# Patient Record
Sex: Female | Born: 1935
Health system: Southern US, Community
[De-identification: ages and names within clinical notes are randomized; demographics above are authoritative.]

## PROBLEM LIST (undated history)

## (undated) DIAGNOSIS — I428 Other cardiomyopathies: Secondary | ICD-10-CM

## (undated) DIAGNOSIS — I10 Essential (primary) hypertension: Secondary | ICD-10-CM

## (undated) DIAGNOSIS — R413 Other amnesia: Secondary | ICD-10-CM

## (undated) DIAGNOSIS — D649 Anemia, unspecified: Secondary | ICD-10-CM

## (undated) DIAGNOSIS — K219 Gastro-esophageal reflux disease without esophagitis: Secondary | ICD-10-CM

## (undated) DIAGNOSIS — E7800 Pure hypercholesterolemia, unspecified: Secondary | ICD-10-CM

## (undated) DIAGNOSIS — I447 Left bundle-branch block, unspecified: Secondary | ICD-10-CM

## (undated) DIAGNOSIS — E119 Type 2 diabetes mellitus without complications: Secondary | ICD-10-CM

## (undated) DIAGNOSIS — M519 Unspecified thoracic, thoracolumbar and lumbosacral intervertebral disc disorder: Secondary | ICD-10-CM

## (undated) HISTORY — DX: Essential (primary) hypertension: I10

## (undated) HISTORY — PX: DILATION AND CURETTAGE OF UTERUS: SHX78

## (undated) HISTORY — PX: FRACTURE SURGERY: SHX138

## (undated) HISTORY — PX: TUBAL LIGATION: SHX77

## (undated) HISTORY — DX: Left bundle-branch block, unspecified: I44.7

## (undated) HISTORY — DX: Gastro-esophageal reflux disease without esophagitis: K21.9

## (undated) HISTORY — PX: CATARACT EXTRACTION W/ INTRAOCULAR LENS IMPLANT: SHX1309

## (undated) HISTORY — PX: EYE SURGERY: SHX253

## (undated) HISTORY — DX: Other cardiomyopathies: I42.8

---

## 1969-07-18 HISTORY — PX: APPENDECTOMY: SHX54

## 1979-07-19 HISTORY — PX: VAGINAL HYSTERECTOMY: SUR661

## 1979-07-19 HISTORY — PX: ORIF ANKLE FRACTURE: SUR919

## 2003-11-03 ENCOUNTER — Ambulatory Visit (HOSPITAL_COMMUNITY): Admission: RE | Admit: 2003-11-03 | Discharge: 2003-11-04 | Payer: Self-pay | Admitting: Cardiology

## 2003-11-07 ENCOUNTER — Encounter: Payer: Self-pay | Admitting: Internal Medicine

## 2003-11-07 ENCOUNTER — Ambulatory Visit (HOSPITAL_COMMUNITY): Admission: RE | Admit: 2003-11-07 | Discharge: 2003-11-07 | Payer: Self-pay | Admitting: Internal Medicine

## 2006-05-21 ENCOUNTER — Ambulatory Visit: Payer: Self-pay | Admitting: Cardiology

## 2006-08-04 ENCOUNTER — Encounter: Payer: Self-pay | Admitting: Cardiology

## 2006-08-04 ENCOUNTER — Ambulatory Visit: Payer: Self-pay

## 2008-09-13 ENCOUNTER — Ambulatory Visit: Payer: Self-pay | Admitting: Cardiology

## 2008-10-04 ENCOUNTER — Encounter: Payer: Self-pay | Admitting: Cardiology

## 2008-10-04 ENCOUNTER — Ambulatory Visit: Payer: Self-pay

## 2009-06-27 ENCOUNTER — Encounter (INDEPENDENT_AMBULATORY_CARE_PROVIDER_SITE_OTHER): Payer: Self-pay | Admitting: *Deleted

## 2009-12-13 DIAGNOSIS — I428 Other cardiomyopathies: Secondary | ICD-10-CM | POA: Insufficient documentation

## 2009-12-13 DIAGNOSIS — K219 Gastro-esophageal reflux disease without esophagitis: Secondary | ICD-10-CM | POA: Insufficient documentation

## 2009-12-13 DIAGNOSIS — I10 Essential (primary) hypertension: Secondary | ICD-10-CM | POA: Insufficient documentation

## 2009-12-13 DIAGNOSIS — E119 Type 2 diabetes mellitus without complications: Secondary | ICD-10-CM | POA: Insufficient documentation

## 2009-12-13 DIAGNOSIS — E118 Type 2 diabetes mellitus with unspecified complications: Secondary | ICD-10-CM | POA: Insufficient documentation

## 2009-12-13 DIAGNOSIS — I152 Hypertension secondary to endocrine disorders: Secondary | ICD-10-CM | POA: Insufficient documentation

## 2009-12-13 DIAGNOSIS — E1165 Type 2 diabetes mellitus with hyperglycemia: Secondary | ICD-10-CM | POA: Insufficient documentation

## 2009-12-13 DIAGNOSIS — I080 Rheumatic disorders of both mitral and aortic valves: Secondary | ICD-10-CM | POA: Insufficient documentation

## 2009-12-14 ENCOUNTER — Ambulatory Visit: Payer: Self-pay | Admitting: Cardiology

## 2009-12-17 ENCOUNTER — Telehealth: Payer: Self-pay | Admitting: Cardiology

## 2010-04-04 ENCOUNTER — Ambulatory Visit (HOSPITAL_COMMUNITY): Admission: RE | Admit: 2010-04-04 | Discharge: 2010-04-04 | Payer: Self-pay | Admitting: Ophthalmology

## 2010-12-15 LAB — CONVERTED CEMR LAB
BUN: 13 mg/dL (ref 6–23)
CO2: 32 meq/L (ref 19–32)
Calcium: 9.5 mg/dL (ref 8.4–10.5)
Chloride: 107 meq/L (ref 96–112)
Creatinine, Ser: 0.7 mg/dL (ref 0.4–1.2)
GFR calc non Af Amer: 105.21 mL/min (ref >60)
Glucose, Bld: 127 mg/dL — ABNORMAL HIGH (ref 70–99)
Potassium: 4.7 meq/L (ref 3.5–5.1)
Sodium: 142 meq/L (ref 135–145)

## 2010-12-17 NOTE — Assessment & Plan Note (Signed)
Summary: rov/ gd  Medications Added LISINOPRIL-HYDROCHLOROTHIAZIDE 20-12.5 MG TABS (LISINOPRIL-HYDROCHLOROTHIAZIDE) Take 1 tablet by mouth once a day AMARYL 4 MG TABS (GLIMEPIRIDE) 1 tab po two times a day * CALCIUM monthly        History of Present Illness: Kelly Castillo is a  female who I have seen previously for a history of nonischemic cardiomyopathy.  A previous catheterization performed in 2004, showed nonobstructive coronary disease and significant mitral regurgitation and graded 3+ or more.  Her ejection fraction was 41% at the time. A TEE at that time showed mitral regurgitation that was felt to be 2-3+.  However, her most recent echocardiogram was performed in 10/09. This showed an ejection fraction of 50-55%, trace aortic insufficiency, and mild mitral and tricuspid regurgitation. Since I last saw her in October of 2009 she denies any dyspnea on exertion, orthopnea, PND, pedal edema, palpitations or syncope.   Preventive Screening-Counseling & Management  Alcohol-Tobacco     Smoking Status: never  Current Medications (verified): 1)  Lisinopril-Hydrochlorothiazide 20-12.5 Mg Tabs (Lisinopril-Hydrochlorothiazide) .... Take 1 Tablet By Mouth Once A Day 2)  Amaryl 4 Mg Tabs (Glimepiride) .Marland Kitchen.. 1 Tab Po Two Times A Day 3)  Calcium .... Monthly  Past History:  Past Medical History: history of nonischemic cardio myopathy improved by most recent echocardiogram Diabetes mellitus Gastroesophageal reflux disease left bundle-branch block hypertension  Past Surgical History: Reviewed history from 12/13/2009 and no changes required. Status post hysterectomy, status post appendectomy.  Social History: Reviewed history and no changes required. Tobacco Use - No.  Alcohol Use - no Smoking Status:  never  Review of Systems       Occasional back pain but no fevers or chills, productive cough, hemoptysis, dysphasia, odynophagia, melena, hematochezia, dysuria, hematuria, rash, seizure  activity, orthopnea, PND, pedal edema, claudication. Remaining systems are negative.   Vital Signs:  Patient profile:   77 year old female Height:      57 inches Weight:      104 pounds BMI:     22.59 Pulse rate:   73 / minute Resp:     12 per minute BP sitting:   150 / 80  (left arm)  Vitals Entered By: Kem Parkinson (December 14, 2009 2:02 PM)  Physical Exam  General:  Well-developed well-nourished in no acute distress.  Skin is warm and dry.  HEENT is normal.  Neck is supple. No thyromegaly.  Chest is clear to auscultation with normal expansion.  Cardiovascular exam is regular rate and rhythm.  Abdominal exam nontender or distended. No masses palpated. Extremities show no edema. neuro grossly intact    EKG  Procedure date:  12/14/2009  Findings:      Sinus rhythm, left bundle branch block.  Impression & Recommendations:  Problem # 1:  CARDIOMYOPATHY (ICD-425.4) This has improved on most recent echocardiogram. Her updated medication list for this problem includes:    Lisinopril-hydrochlorothiazide 20-12.5 Mg Tabs (Lisinopril-hydrochlorothiazide) .Marland Kitchen... Take 1 tablet by mouth once a day  Problem # 2:  HYPERTENSION (ICD-401.9) Blood pressure mildly elevated. However she states it is typically in the normal range. She will follow this and we will increase medications as needed. Check bmet. Her updated medication list for this problem includes:    Lisinopril-hydrochlorothiazide 20-12.5 Mg Tabs (Lisinopril-hydrochlorothiazide) .Marland Kitchen... Take 1 tablet by mouth once a day  Orders: TLB-BMP (Basic Metabolic Panel-BMET) (80048-METABOL)  Problem # 3:  MITRAL REGURGITATION (ICD-396.3) Mild on most recent echocardiogram.  Problem # 4:  DM (ICD-250.00)  Her updated  medication list for this problem includes:    Lisinopril-hydrochlorothiazide 20-12.5 Mg Tabs (Lisinopril-hydrochlorothiazide) .Marland Kitchen... Take 1 tablet by mouth once a day    Amaryl 4 Mg Tabs (Glimepiride) .Marland Kitchen... 1 tab  po two times a day  Problem # 5:  GERD (ICD-530.81)  Patient Instructions: 1)  Your physician recommends that you schedule a follow-up appointment in: 12 months with Dr Jens Som 2)  Your physician recommends that you return for lab work today

## 2010-12-17 NOTE — Letter (Signed)
Summary: Appointment - Reminder 2  [facility], Main Office  [address]   [place], [place] [address]   Phone: [phone]  Fax: [phone]     June 27, 2009 MRN: [id number]   Kelly Castillo [address] [place], [place]  [address]   Dear Kelly Castillo,  Our records indicate that it is time to schedule a follow-up appointment.  Dr.Crenshaw recommended that you follow up with [place] in Oct 2010. It is very important that we reach you to schedule this appointment. We look forward to participating in your health care needs. Please contact us at the number listed above at your earliest convenience to schedule your appointment.  If you are unable to make an appointment at this time, give [place] a call so we can update our records.     Sincerely,  Lorne Skeens [facility] Scheduling Team

## 2010-12-17 NOTE — Progress Notes (Signed)
Summary: call back   Phone Note Call from Patient Call back at Home Phone [phone]   Caller: Patient Reason for Call: Talk to Nurse Action Taken: Phone Call Completed Details for Reason: returning call from friday Initial call taken by: Lorne Skeens,  December 17, 2009 8:38 AM

## 2010-12-31 ENCOUNTER — Ambulatory Visit: Payer: Self-pay | Admitting: Cardiology

## 2011-01-28 ENCOUNTER — Encounter: Payer: Self-pay | Admitting: Cardiology

## 2011-02-03 LAB — BASIC METABOLIC PANEL
BUN: 20 mg/dL (ref 6–23)
CO2: 30 mEq/L (ref 19–32)
Calcium: 9.9 mg/dL (ref 8.4–10.5)
Chloride: 102 mEq/L (ref 96–112)
Creatinine, Ser: 0.97 mg/dL (ref 0.4–1.2)
GFR calc Af Amer: >60 mL/min (ref >60)
GFR calc non Af Amer: 56 mL/min — ABNORMAL LOW (ref >60)
Glucose, Bld: 180 mg/dL — ABNORMAL HIGH (ref 70–99)
Potassium: 4.6 mEq/L (ref 3.5–5.1)
Sodium: 138 mEq/L (ref 135–145)

## 2011-02-03 LAB — HEMOGLOBIN AND HEMATOCRIT, BLOOD
HCT: 29.2 % — ABNORMAL LOW (ref 36.0–46.0)
Hemoglobin: 10.2 g/dL — ABNORMAL LOW (ref 12.0–15.0)

## 2011-02-03 LAB — GLUCOSE, CAPILLARY: Glucose-Capillary: 118 mg/dL — ABNORMAL HIGH (ref 70–99)

## 2011-02-11 ENCOUNTER — Encounter: Payer: Self-pay | Admitting: Cardiology

## 2011-02-11 ENCOUNTER — Ambulatory Visit (INDEPENDENT_AMBULATORY_CARE_PROVIDER_SITE_OTHER): Payer: Medicare Other | Admitting: Cardiology

## 2011-02-11 VITALS — BP 157/67 | HR 72 | Resp 12

## 2011-02-11 DIAGNOSIS — I428 Other cardiomyopathies: Secondary | ICD-10-CM

## 2011-02-11 MED ORDER — HYDROCHLOROTHIAZIDE 12.5 MG PO CAPS: 12.5000 mg | ORAL_CAPSULE | Freq: Every day | ORAL | Status: DC

## 2011-02-11 MED ORDER — LISINOPRIL 40 MG PO TABS: 40.0000 mg | ORAL_TABLET | Freq: Every day | ORAL | Status: DC

## 2011-02-11 NOTE — Progress Notes (Signed)
HPI:Kelly Castillo is a  female who I have seen previously for a history of nonischemic cardiomyopathy.  A previous catheterization performed in 2004, showed nonobstructive coronary disease and significant mitral regurgitation and graded 3+ or more.  Her ejection fraction was 41% at the time. A TEE at that time showed mitral regurgitation that was felt to be 2-3+.  However, her most recent echocardiogram was performed in 10/09. This showed an ejection fraction of 50-55%, trace aortic insufficiency, and mild mitral and tricuspid regurgitation. Since I last saw her in Jan 2011 she denies any dyspnea on exertion, orthopnea, PND, pedal edema, palpitations or syncope.  Current Outpatient Prescriptions  Medication Sig Dispense Refill  . glimepiride (AMARYL) 4 MG tablet Take 4 mg by mouth 2 (two) times daily.        . iron polysaccharides (FERREX 150) 150 MG capsule Take 150 mg by mouth daily.        Marland Kitchen lisinopril-hydrochlorothiazide (PRINZIDE,ZESTORETIC) 10-12.5 MG per tablet Take 1 tablet by mouth daily.        Marland Kitchen DISCONTD: ibandronate (BONIVA) 150 MG tablet Take 150 mg by mouth every 30 (thirty) days. Take in the morning with a full glass of water, on an empty stomach, and do not take anything else by mouth or lie down for the next 30 min.       Marland Kitchen DISCONTD: lisinopril-hydrochlorothiazide (PRINZIDE,ZESTORETIC) 20-12.5 MG per tablet Take 1 tablet by mouth daily.           Past Medical History  Diagnosis Date  . Non-ischemic cardiomyopathy   . Diabetes mellitus   . GERD (gastroesophageal reflux disease)   . LBBB (left bundle branch block)   . HTN (hypertension)     Past Surgical History  Procedure Date  . Abdominal hysterectomy   . Appendectomy     History   Social History  . Marital Status: Divorced    Spouse Name: N/A    Number of Children: N/A  . Years of Education: N/A   Occupational History  . Not on file.   Social History Main Topics  . Smoking status: Never Smoker   . Smokeless  tobacco: Not on file  . Alcohol Use: No  . Drug Use: Not on file  . Sexually Active: Not on file   Other Topics Concern  . Not on file   Social History Narrative  . No narrative on file    ROS: Recent fatigue from anemia being evaluated by her primary care but no fevers or chills, productive cough, hemoptysis, dysphasia, odynophagia, melena, hematochezia, dysuria, hematuria, rash, seizure activity, orthopnea, PND, pedal edema, claudication. Remaining systems are negative.  Physical Exam: Well-developed well-nourished in no acute distress.  Skin is warm and dry.  HEENT is normal.  Neck is supple. No thyromegaly.  Chest is clear to auscultation with normal expansion.  Cardiovascular exam is regular rate and rhythm.  Abdominal exam nontender or distended. No masses palpated. Extremities show no edema. neuro grossly intact  ECG  Normal sinus rhythmat a rate of 63. Left bundle branch block.

## 2011-02-11 NOTE — Assessment & Plan Note (Signed)
Blood pressure elevated. Increase lisinopril to 40 mg p.o. Daily and continue HCTZ. Check potassium and renal function in one week.

## 2011-02-11 NOTE — Patient Instructions (Signed)
STOP LISINOPRIL HCT START LISINOPRIL 83M,G ONCE DAILY START HCTZ 12.5MG  ONCE DAILY

## 2011-02-11 NOTE — Assessment & Plan Note (Signed)
Management per primary care. 

## 2011-02-11 NOTE — Assessment & Plan Note (Signed)
Echocardiogram most recently revealed normalization of LV function. Continue ACE inhibitor.

## 2011-02-11 NOTE — Assessment & Plan Note (Signed)
Mild on most recent echocardiogram. 

## 2011-02-12 NOTE — Progress Notes (Signed)
Addended by: Kem Parkinson on: 02/12/2011 11:49 AM   Modules accepted: Orders

## 2011-04-01 NOTE — Assessment & Plan Note (Signed)
[facility]                            CARDIOLOGY OFFICE NOTE   AZALIAH, CARRERO                    MRN:          [id number]  DATE:09/13/2008                            DOB:          1934/04/10    Mrs. Rone is a 77 year old female who I have not seen since July 2007.  She has a history of nonischemic cardiomyopathy.  A previous  catheterization performed in 2004, showed nonobstructive coronary  disease and significant mitral regurgitation.  A TEE at that time showed  mitral regurgitation that was felt to be 2-3+.  However, her most recent  echocardiogram was performed in 2007.  This showed normal LV function  with an ejection fraction of 65%.  There is trivial aortic  insufficiency.  There is mild mitral regurgitation.  There is mild left  atrial enlargement.  Again, I have not seen her since 2007.  She does  state she has dyspnea with more extreme activities, but not with routine  activities.  There is no orthopnea, PND or pedal edema.  She has not had  palpitations, exertional chest pain, or syncope.  She is complaining of  some pain in her feet when she stands; however, she does not have frank  claudication.   CURRENT MEDICATIONS:  Her medications include Amaryl 4 mg p.o. b.i.d.,  vitamins and folate.  She also states she takes a blood pressure pill.   PHYSICAL EXAMINATION:  VITAL SIGNS:  Exam today shows a blood pressure  of 152/66 and pulse is 70.  She weighs 112 pounds.  HEENT:  Normal.  NECK:  Supple with no bruits.  CHEST:  Clear.  CARDIOVASCULAR:  Regular rhythm.  There is a 2/6 systolic murmur at the  apex.  ABDOMEN:  Shows no tenderness.  EXTREMITIES:  Show no edema.  She has 2+ dorsalis pedis pulses  bilaterally.   DIAGNOSTIC DATA:  Her electrocardiogram shows sinus rhythm at a rate of  65.  There is a left bundle-branch block.   DIAGNOSES:  1. History of nonischemic cardiomyopathy - her most recent      echocardiogram  showed that this improved and normal.  We will plan      to repeat this.  She does not know her medications and I have asked      her to contact us with those names.  We will adjust these based on      which she is taking at present.  2. History of noncompliance.  3. Mitral regurgitation - we will plan to repeat her echocardiogram.  4. Hypertension - her blood pressure is mildly elevated.  Again I have      asked her to contact us with her medications.  We will increase as      needed.  5. Diabetes mellitus.  6. Gastroesophageal reflux disease.  7. History of foot pain - she has 2+ DP pulses and this was not found      to be claudication.  It may be a peripheral neuropathy.  8. History of diabetes - note we had placed her on statin in the past,  but she discontinued this.  I will leave this to her primary care      physician.  We will see her back in 12 months.     Madolyn Frieze Jens Som, MD, [facility]  Electronically Signed    BSC/MedQ  DD: 09/13/2008  DT: 09/13/2008  Job #: [id number]   cc:   Tammy R. Collins [place], M.D.

## 2011-04-04 NOTE — Discharge Summary (Signed)
Kelly Castillo, Kelly Castillo                         ACCOUNT NO.:  [id number]   MEDICAL RECORD NO.:  [id number]                   PATIENT TYPE:  OIB   LOCATION:  6703                                 FACILITY:  [facility]   PHYSICIAN:  Arturo Morton. Riley Kill, M.D.             DATE OF BIRTH:  12/27/1933   DATE OF ADMISSION:  11/03/2003  DATE OF DISCHARGE:  11/04/2003                                 DISCHARGE SUMMARY   DISCHARGE DIAGNOSES:  1. Non-critical coronary artery disease.  2. Significant mitral regurgitation.  3. Pulmonary hypertension.  4. Diabetes mellitus, oral agents.  5. Gastroesophageal reflux disease.  6. Status post hysterectomy, status post appendectomy.   HOSPITAL COURSE:  [name] is a 77 year old female patient who presented  to Dr. Jens Som on November 02, 2003, for evaluation of chest pain and an  abnormal EKG.  She describes chest pain that occurs with moderate exertion  and improves with decreasing her activities.  There is no nausea, vomiting,  or diaphoresis, just shortness of breath.  The pain resolves with rest.  She  was noted on exam to have a 2/6 systolic murmur at the apex that radiates to  the left axilla with a squeaking type component.  At that point, she was  felt to have classic anginal symptoms and was sent for cardiac  catheterization.  She was admitted on November 03, 2003, for a cardiac  catheterization.  She was found to have non-obstructive coronary artery  disease.  The LV gram revealed mild global dyskinesis and an EF of 41%.  LV  gram revealed a significantly enlarged left atrium and 3+ or more mitral  regurgitation.  The patient's medications were adjusted and she remained  stable overnight and was ready for discharge home the following day.  Temperature was 98.3, pulse 70, respirations 18, blood pressure 121/51, 99%  on room air.   LABORATORY DATA:  Hemoglobin 11.6, hematocrit 34.6, platelets 216, BUN 10,  creatinine 0.7.  Her right groin was clear  with no evidence of ecchymoses,  tenderness, or bruit.   She has a TEE scheduled for Tuesday, and this will be performed at noon.  She is not to drink anything after midnight on November 06, 2003, for this  TEE on November 07, 2003.  She may resume her Amaryl, but she is to hold  this on Tuesday, November 07, 2003.   NEW MEDICATIONS:  1. Altace 2.5 mg b.i.d.  2. HCTZ 12.5 mg a day.  3. She is to resume her Zocor 40 mg a day, and Toprol XL 25 mg a day, as     well as her baby aspirin.   She needs a BMET that Tuesday prior to her TEE, and this needs to be called  to our service before the TEE is completed.  She may utilize Tylenol.   ACTIVITY:  No strenuous activity until this is performed.   DIET:  Remain on  a low fat diet.   Call with any questions or concerns with her cath site.   FOLLOWUP:  Her formal followup with Dr. Jens Som is on November 13, 2003, at  10:45 a.m.  Apparently, she also has another follow up appointment on  November 29, 2003, at 9:15 a.m.      Guy Franco, P.A. [facility]                      Thomas D. Riley Kill, M.D.    LB/MEDQ  D:  11/04/2003  T:  11/05/2003  Job:  [id number]

## 2011-04-04 NOTE — Cardiovascular Report (Signed)
Kelly Castillo, Kelly Castillo                         ACCOUNT NO.:  [id number]   MEDICAL RECORD NO.:  [id number]                   PATIENT TYPE:  OIB   LOCATION:  6703                                 FACILITY:  [facility]   PHYSICIAN:  Arturo Morton. Riley Kill, M.D.             DATE OF BIRTH:  September 01, 1934   DATE OF PROCEDURE:  DATE OF DISCHARGE:  11/04/2003                              CARDIAC CATHETERIZATION   PROCEDURES PERFORMED:  1. Left and right heart catheterization.  2. Selective coronary arteriography.  3. Selective left ventriculography.   CARDIOLOGIST:  Arturo Morton. Riley Kill, M.D.   INDICATIONS FOR PROCEDURE:  Ms. Pluta is a delightful 77 year old female  with a history of diabetes and with a history of classical exertional  angina.  She was seen by Dr. Jens Som and subsequently referred for cardiac  catheterization.   DESCRIPTION OF THE PROCEDURE:  The patient was brought ot the cath lab and  prepped and draped in the usual fashion.  Through an anterior puncture the  right femoral artery was easily entered.  Ventriculography was performed in  the RAO projection.  This demonstrated reduced overall LV function and  evidence of mitral regurgitation of a substantial degree.  Based on this we  had planned to do right heart catheterization.   Following a pressure pullback the pigtail catheter was removed.  Coronary  arteriography was then performed without complications using standard  Judkins catheters.  Attention was then turned to right heart  catheterization.   An 8 French sheath was placed in the right femoral vein.  A 7,5 French  thermodilution Swan-Ganz catheter had been tested by the technologist and  was placed in the inferior vena cava.  With inflation of the balloon there  was fracture of the balloon with some air noted.  This was a fairly small  amount.  We used a right coronary guide catheter and I removed the majority  of this and the remainder was dissolved.  A second catheter  was then  retested and found to be satisfactory, and taken up into the pulmonary  artery where pressures were measured.  Thermodilution cardiac outputs were  performed.  Saturations were obtained from the superior vena cava and the  pulmonary artery as well as an arterial saturation from the right heart.  Simultaneous wedge LV pressures were recorded.  The right heart catheter was  subsequently removed.   All catheters were subsequently removed and the patient was taken to the  holding are in satisfactory clinical condition.  She experienced no chest  pain or significant shortness of breath.  She as briefly given O2.   HEMODYNAMIC DATA:  1. Right atrial pressure 10.  2. RV 46/9.  3. Pulmonary artery 45/23.  4. Pulmonary capillary wedge 20.  5. Central aorta 159/76, 158/17.  6. No aortic to left ventricular gradient on pullback.  7. No significant mitral valve gradient.  8. Aortic saturation 90%.  9. Superior  vena cava saturation 63%.  10.      PA saturation 62%.  11.      Thermodilution cardiac output 32.9 liters/minute.  12.      Thermodilution cardiac index 2.6 liters/minute/meter squared.  13.      Fick cardiac output was 3.2 liters/minute.  14.      Fick cardiac index 2.1 liters/minute/meter squared.   ANGIOGRAPHIC DATA:  1. Selective Left Ventriculography:  Ventriculography in the RAO projection     reveals mild-to-moderate global hypokinesis.  There appears to be at     least + mitral regurgitation in the left atrium; it would appear to be     really quite large.  The overall left ventricular systolic function is     reduced with an ejection fraction of 41%.   1. Left Main Coronary Artery:  The left main coronary artery is free of     critical disease.   1. Left Anterior Descending:  The LAD courses to the apex.  There appears to     be one major diagonal branch.  The LAD at the apex bifurcates.  The LAD     really is quite smooth given the patient's diabetes and the   LAD diagonal     system is without critical disease.   1. Circumflex Artery:  The circumflex system provides a proximal marginal     branch, which has two major bifurcations.  There is a second mg; branch,     which is relatively small; and a fairly small AV circumflex, which     provides several tiny posterolateral branches.  There is a moderate-sized     atrial circumflex branch noted.   1. Right Coronary Artery:  The right coronary artery is a dominant vessel.     The RCA provides four major inferior branches.  All of these are moderate     in size, appear to be fairly smooth throughout their course even distally     and without critical narrowing.   CONCLUSIONS:  1. Moderate global hypokinesis with reduced ejection fraction, 41%.  2. Mitral regurgitation moderately severe and probably severe.  3. Pulmonary hypertension probably secondary to #2.  4. No critical coronary artery disease.   DISPOSITION:  I think this nice lady will likely need gentle diuresis as  well as probable transesophageal echocardiography.  She may likely need  mitral valve repair or surgery.                                               Arturo Morton. Riley Kill, M.D.    TDS/MEDQ  D:  11/03/2003  T:  11/04/2003  Job:  [id number]   cc:   Charlesetta Shanks  [address]  [place]  [place] [address]  Fax: [phone]   Olga Millers, M.D.   [facility]

## 2011-07-17 NOTE — Patient Instructions (Addendum)
20 Kelly Castillo  07/17/2011   Your procedure is scheduled on:  07/24/2011  Report to [facility] at  730  AM.  Call this number if you have problems the morning of surgery: [phone]   Remember:   Do not eat food:After Midnight.  Do not drink clear liquids: After Midnight.  Take these medicines the morning of surgery with A SIP OF WATER: hctz and lisinopril   Do not wear jewelry, make-up or nail polish.  Do not wear lotions, powders, or perfumes. You may wear deodorant.  Do not shave 48 hours prior to surgery.  Do not bring valuables to the hospital.  Contacts, dentures or bridgework may not be worn into surgery.  Leave suitcase in the car. After surgery it may be brought to your room.  For patients admitted to the hospital, checkout time is 11:00 AM the day of discharge.   Patients discharged the day of surgery will not be allowed to drive home.  Name and phone number of your driver: family  Special Instructions: N/A   Please read over the following fact sheets that you were given: Pain Booklet, Surgical Site Infection Prevention, Anesthesia Post-op Instructions and Care and Recovery After Surgery PATIENT INSTRUCTIONS POST-ANESTHESIA  IMMEDIATELY FOLLOWING SURGERY:  Do not drive or operate machinery for the first twenty four hours after surgery.  Do not make any important decisions for twenty four hours after surgery or while taking narcotic pain medications or sedatives.  If you develop intractable nausea and vomiting or a severe headache please notify your doctor immediately.  FOLLOW-UP:  Please make an appointment with your surgeon as instructed. You do not need to follow up with anesthesia unless specifically instructed to do so.  WOUND CARE INSTRUCTIONS (if applicable):  Keep a dry clean dressing on the anesthesia/puncture wound site if there is drainage.  Once the wound has quit draining you may leave it open to air.  Generally you should leave the bandage intact for twenty  four hours unless there is drainage.  If the epidural site drains for more than 36-48 hours please call the anesthesia department.  QUESTIONS?:  Please feel free to call your physician or the hospital operator if you have any questions, and they will be happy to assist you.     [facility] Anesthesia Department [address] [place] [phone]

## 2011-07-18 ENCOUNTER — Encounter (HOSPITAL_COMMUNITY)
Admission: RE | Admit: 2011-07-18 | Discharge: 2011-07-18 | Disposition: A | Discharge status: Home / Self Care | Payer: Medicare Other | Source: Ambulatory Visit | Attending: Ophthalmology | Admitting: Ophthalmology

## 2011-07-18 ENCOUNTER — Encounter (HOSPITAL_COMMUNITY): Payer: Self-pay

## 2011-07-18 HISTORY — DX: Unspecified thoracic, thoracolumbar and lumbosacral intervertebral disc disorder: M51.9

## 2011-07-18 HISTORY — DX: Anemia, unspecified: D64.9

## 2011-07-18 LAB — CBC
HCT: 31.3 % — ABNORMAL LOW (ref 36.0–46.0)
Hemoglobin: 10 g/dL — ABNORMAL LOW (ref 12.0–15.0)
MCH: 26.5 pg (ref 26.0–34.0)
MCHC: 31.9 g/dL (ref 30.0–36.0)
MCV: 82.8 fL (ref 78.0–100.0)
Platelets: 219 10*3/uL (ref 150–400)
RBC: 3.78 MIL/uL — ABNORMAL LOW (ref 3.87–5.11)
RDW: 14.7 % (ref 11.5–15.5)
WBC: 3.7 10*3/uL — ABNORMAL LOW (ref 4.0–10.5)

## 2011-07-18 LAB — BASIC METABOLIC PANEL
BUN: 21 mg/dL (ref 6–23)
CO2: 30 mEq/L (ref 19–32)
Calcium: 10 mg/dL (ref 8.4–10.5)
Chloride: 102 mEq/L (ref 96–112)
Creatinine, Ser: 0.79 mg/dL (ref 0.50–1.10)
GFR calc Af Amer: >60 mL/min (ref >60)
GFR calc non Af Amer: >60 mL/min (ref >60)
Glucose, Bld: 194 mg/dL — ABNORMAL HIGH (ref 70–99)
Potassium: 4.4 mEq/L (ref 3.5–5.1)
Sodium: 139 mEq/L (ref 135–145)

## 2011-07-24 ENCOUNTER — Ambulatory Visit (HOSPITAL_COMMUNITY): Payer: Medicare Other | Admitting: Anesthesiology

## 2011-07-24 ENCOUNTER — Ambulatory Visit (HOSPITAL_COMMUNITY)
Admission: RE | Admit: 2011-07-24 | Discharge: 2011-07-24 | Disposition: A | Discharge status: Home / Self Care | Payer: Medicare Other | Source: Ambulatory Visit | Attending: Ophthalmology | Admitting: Ophthalmology

## 2011-07-24 ENCOUNTER — Encounter (HOSPITAL_COMMUNITY): Payer: Self-pay | Admitting: Anesthesiology

## 2011-07-24 ENCOUNTER — Encounter (HOSPITAL_COMMUNITY)
Admission: RE | Disposition: A | Discharge status: Home / Self Care | Payer: Self-pay | Source: Ambulatory Visit | Attending: Ophthalmology

## 2011-07-24 ENCOUNTER — Encounter (HOSPITAL_COMMUNITY): Payer: Self-pay | Admitting: Ophthalmology

## 2011-07-24 DIAGNOSIS — E119 Type 2 diabetes mellitus without complications: Secondary | ICD-10-CM | POA: Insufficient documentation

## 2011-07-24 DIAGNOSIS — H2589 Other age-related cataract: Secondary | ICD-10-CM | POA: Insufficient documentation

## 2011-07-24 DIAGNOSIS — Z79899 Other long term (current) drug therapy: Secondary | ICD-10-CM | POA: Insufficient documentation

## 2011-07-24 DIAGNOSIS — I10 Essential (primary) hypertension: Secondary | ICD-10-CM | POA: Insufficient documentation

## 2011-07-24 DIAGNOSIS — Z01812 Encounter for preprocedural laboratory examination: Secondary | ICD-10-CM | POA: Insufficient documentation

## 2011-07-24 HISTORY — PX: CATARACT EXTRACTION W/PHACO: SHX586

## 2011-07-24 LAB — GLUCOSE, CAPILLARY: Glucose-Capillary: 123 mg/dL — ABNORMAL HIGH (ref 70–99)

## 2011-07-24 SURGERY — PHACOEMULSIFICATION, CATARACT, WITH IOL INSERTION
Anesthesia: Monitor Anesthesia Care | Site: Eye | Laterality: Left | Wound class: Clean

## 2011-07-24 MED ORDER — LIDOCAINE HCL 3.5 % OP GEL
OPHTHALMIC | Status: DC | PRN
  Administered 2011-07-24: 1 via OPHTHALMIC

## 2011-07-24 MED ORDER — PHENYLEPHRINE HCL 2.5 % OP SOLN
1.0000 [drp] | OPHTHALMIC | Status: AC
  Administered 2011-07-24 (×3): 1 [drp] via OPHTHALMIC

## 2011-07-24 MED ORDER — PHENYLEPHRINE HCL 2.5 % OP SOLN
OPHTHALMIC | Status: AC
  Filled 2011-07-24: qty 2

## 2011-07-24 MED ORDER — ONDANSETRON HCL 4 MG/2ML IJ SOLN: 4.0000 mg | Freq: Once | INTRAMUSCULAR | Status: DC | PRN

## 2011-07-24 MED ORDER — EPINEPHRINE HCL 1 MG/ML IJ SOLN
INTRAOCULAR | Status: DC | PRN
  Administered 2011-07-24: 10:00:00

## 2011-07-24 MED ORDER — EPINEPHRINE HCL 1 MG/ML IJ SOLN
INTRAMUSCULAR | Status: AC
  Filled 2011-07-24: qty 1

## 2011-07-24 MED ORDER — NEOMYCIN-POLYMYXIN-DEXAMETH 0.1 % OP OINT
TOPICAL_OINTMENT | OPHTHALMIC | Status: DC | PRN
  Administered 2011-07-24: 1 via OPHTHALMIC

## 2011-07-24 MED ORDER — LIDOCAINE HCL (PF) 1 % IJ SOLN
INTRAMUSCULAR | Status: AC
  Filled 2011-07-24: qty 2

## 2011-07-24 MED ORDER — TETRACAINE HCL 0.5 % OP SOLN
1.0000 [drp] | OPHTHALMIC | Status: AC
  Administered 2011-07-24 (×3): 1 [drp] via OPHTHALMIC

## 2011-07-24 MED ORDER — LIDOCAINE HCL 3.5 % OP GEL: 1.0000 "application " | Freq: Once | OPHTHALMIC | Status: DC

## 2011-07-24 MED ORDER — LIDOCAINE HCL (PF) 1 % IJ SOLN
INTRAMUSCULAR | Status: DC | PRN
  Administered 2011-07-24: .3 mL

## 2011-07-24 MED ORDER — PROVISC 10 MG/ML IO SOLN
INTRAOCULAR | Status: DC | PRN
  Administered 2011-07-24: 8.5 mg via OPHTHALMIC

## 2011-07-24 MED ORDER — LIDOCAINE HCL 3.5 % OP GEL
OPHTHALMIC | Status: AC
  Filled 2011-07-24: qty 5

## 2011-07-24 MED ORDER — MIDAZOLAM HCL 2 MG/2ML IJ SOLN
1.0000 mg | INTRAMUSCULAR | Status: DC | PRN
  Administered 2011-07-24: 2 mg via INTRAVENOUS

## 2011-07-24 MED ORDER — MIDAZOLAM HCL 2 MG/2ML IJ SOLN
INTRAMUSCULAR | Status: AC
  Filled 2011-07-24: qty 2

## 2011-07-24 MED ORDER — TETRACAINE HCL 0.5 % OP SOLN
OPHTHALMIC | Status: AC
  Filled 2011-07-24: qty 2

## 2011-07-24 MED ORDER — FENTANYL CITRATE 0.05 MG/ML IJ SOLN: 25.0000 ug | INTRAMUSCULAR | Status: DC | PRN

## 2011-07-24 MED ORDER — CYCLOPENTOLATE-PHENYLEPHRINE 0.2-1 % OP SOLN
1.0000 [drp] | OPHTHALMIC | Status: AC
  Administered 2011-07-24 (×3): 1 [drp] via OPHTHALMIC

## 2011-07-24 MED ORDER — NEOMYCIN-POLYMYXIN-DEXAMETH 3.5-10000-0.1 OP OINT
TOPICAL_OINTMENT | OPHTHALMIC | Status: AC
  Filled 2011-07-24: qty 3.5

## 2011-07-24 MED ORDER — BSS IO SOLN
INTRAOCULAR | Status: DC | PRN
  Administered 2011-07-24: 15 mL via OPHTHALMIC

## 2011-07-24 MED ORDER — CYCLOPENTOLATE-PHENYLEPHRINE 0.2-1 % OP SOLN
OPHTHALMIC | Status: AC
  Filled 2011-07-24: qty 2

## 2011-07-24 MED ORDER — LACTATED RINGERS IV SOLN: INTRAVENOUS | Status: DC

## 2011-07-24 MED ORDER — LACTATED RINGERS IV SOLN
INTRAVENOUS | Status: DC
  Administered 2011-07-24: 10:00:00 via INTRAVENOUS

## 2011-07-24 SURGICAL SUPPLY — 34 items
CAPSULAR TENSION RING-AMO (OPHTHALMIC RELATED) IMPLANT
CLOTH BEACON ORANGE TIMEOUT ST (SAFETY) ×2 IMPLANT
DUOVISC SYSTEM (INTRAOCULAR LENS)
EYE SHIELD UNIVERSAL CLEAR (GAUZE/BANDAGES/DRESSINGS) ×2 IMPLANT
GLOVE BIO SURGEON STRL SZ 6.5 (GLOVE) IMPLANT
GLOVE BIOGEL PI IND STRL 6.5 (GLOVE) IMPLANT
GLOVE BIOGEL PI IND STRL 7.0 (GLOVE) IMPLANT
GLOVE BIOGEL PI IND STRL 7.5 (GLOVE) ×1 IMPLANT
GLOVE BIOGEL PI INDICATOR 6.5 (GLOVE)
GLOVE BIOGEL PI INDICATOR 7.0 (GLOVE)
GLOVE BIOGEL PI INDICATOR 7.5 (GLOVE) ×1
GLOVE ECLIPSE 6.5 STRL STRAW (GLOVE) IMPLANT
GLOVE ECLIPSE 7.0 STRL STRAW (GLOVE) IMPLANT
GLOVE ECLIPSE 7.5 STRL STRAW (GLOVE) IMPLANT
GLOVE EXAM NITRILE LRG STRL (GLOVE) ×2 IMPLANT
GLOVE EXAM NITRILE MD LF STRL (GLOVE) IMPLANT
GLOVE SKINSENSE NS SZ6.5 (GLOVE)
GLOVE SKINSENSE NS SZ7.0 (GLOVE)
GLOVE SKINSENSE STRL SZ6.5 (GLOVE) IMPLANT
GLOVE SKINSENSE STRL SZ7.0 (GLOVE) IMPLANT
KIT VITRECTOMY (OPHTHALMIC RELATED) IMPLANT
PAD ARMBOARD 7.5X6 YLW CONV (MISCELLANEOUS) ×2 IMPLANT
PROC W NO LENS (INTRAOCULAR LENS)
PROC W SPEC LENS (INTRAOCULAR LENS)
PROCESS W NO LENS (INTRAOCULAR LENS) IMPLANT
PROCESS W SPEC LENS (INTRAOCULAR LENS) IMPLANT
RING MALYGIN (MISCELLANEOUS) IMPLANT
SIGHTPATH CAT PROC W REG LENS (Ophthalmic Related) ×2 IMPLANT
SYR TB 1ML LL NO SAFETY (SYRINGE) ×2 IMPLANT
SYSTEM DUOVISC (INTRAOCULAR LENS) IMPLANT
TAPE SURG TRANSPARENT 2IN (GAUZE/BANDAGES/DRESSINGS) ×1 IMPLANT
TAPE TRANSPARENT 2IN (GAUZE/BANDAGES/DRESSINGS) ×1
VISCOELASTIC ADDITIONAL (OPHTHALMIC RELATED) IMPLANT
WATER STERILE IRR 250ML POUR (IV SOLUTION) ×2 IMPLANT

## 2011-07-24 NOTE — Anesthesia Preprocedure Evaluation (Signed)
Anesthesia Evaluation  Name, MR# and DOB Patient awake  General Assessment Comment  Reviewed: Allergy & Precautions, H&P , NPO status , Patient's Chart, lab work & pertinent test results  Airway Mallampati: II      Dental  (+) Edentulous Upper   Pulmonary    pulmonary exam normalPulmonary Exam Normal     Cardiovascular hypertension, Pt. on medications Regular Normal    Neuro/Psych   GI/Hepatic/Renal        GERD      Endo/Other  (+) Diabetes mellitus-, Well Controlled, Type 2, Oral Hypoglycemic Agents,     Abdominal   Musculoskeletal   Hematology   Peds  Reproductive/Obstetrics    Anesthesia Other Findings             Anesthesia Physical Anesthesia Plan  ASA: III  Anesthesia Plan: MAC   Post-op Pain Management:    Induction: Intravenous  Airway Management Planned: Nasal Cannula  Additional Equipment:   Intra-op Plan:   Post-operative Plan:   Informed Consent: I have reviewed the patients History and Physical, chart, labs and discussed the procedure including the risks, benefits and alternatives for the proposed anesthesia with the patient or authorized representative who has indicated his/her understanding and acceptance.     Plan Discussed with:   Anesthesia Plan Comments:         Anesthesia Quick Evaluation

## 2011-07-24 NOTE — Anesthesia Postprocedure Evaluation (Signed)
  Anesthesia Post-op Note  Patient: Kelly Castillo  Procedure(s) Performed:  CATARACT EXTRACTION PHACO AND INTRAOCULAR LENS PLACEMENT (IOC) - CDE: 14.73  Patient Location:  Short Stay  Anesthesia Type: MAC  Level of Consciousness: awake  Airway and Oxygen Therapy: Patient Spontanous Breathing  Post-op Pain: none  Post-op Assessment: Post-op Vital signs reviewed, Patient's Cardiovascular Status Stable, Respiratory Function Stable, Patent Airway, No signs of Nausea or vomiting and Pain level controlled  Post-op Vital Signs: Reviewed and stable  Complications: No apparent anesthesia complications

## 2011-07-24 NOTE — Anesthesia Procedure Notes (Signed)
Procedure Name: MAC Date/Time: 07/24/2011 9:58 AM Performed by: Minerva Areola Pre-anesthesia Checklist: Patient identified, Patient being monitored, Emergency Drugs available, Timeout performed and Suction available Oxygen Delivery Method: Nasal Cannula

## 2011-07-24 NOTE — H&P (Signed)
I have evaluated the patient preoperatively, and have identified no interval changes in medical condition and plan of care since the history and physical of record 

## 2011-07-24 NOTE — Brief Op Note (Signed)
Pre-Op Dx: Cataract OS Post-Op Dx: Cataract OS Surgeon: Ayrton Mcvay Anesthesia: Topical with MAC Implant: Lenstec, Model Softec HD Specimen: None Complications: None 

## 2011-07-24 NOTE — Transfer of Care (Signed)
Immediate Anesthesia Transfer of Care Note  Patient: Kelly Castillo  Procedure(s) Performed:  CATARACT EXTRACTION PHACO AND INTRAOCULAR LENS PLACEMENT (IOC) - CDE: 14.73  Patient Location: Shortstay  Anesthesia Type: MAC  Level of Consciousness: awake  Airway & Oxygen Therapy: Patient Spontanous Breathing   Post-op Assessment: Report given to PACU RN, Post -op Vital signs reviewed and stable and Patient moving all extremities  Post vital signs: Reviewed and stable  Complications: No apparent anesthesia complications

## 2011-07-24 NOTE — Op Note (Signed)
NAMESHI, GROSE NO.:  [id number]  MEDICAL RECORD NO.:  [id number]  LOCATION:  [facility]                          FACILITY:  [facility]  PHYSICIAN:  Susanne Greenhouse, MD       DATE OF BIRTH:  August 18, 1934  DATE OF PROCEDURE:  07/24/2011 DATE OF DISCHARGE:  07/24/2011                              OPERATIVE REPORT   PREOPERATIVE DIAGNOSIS:  Combined cataract, left eye, diagnosis code 366.19.  POSTOPERATIVE DIAGNOSIS:  Combined cataract, left eye, diagnosis code 366.19.  OPERATION PERFORMED:  Phacoemulsification with posterior chamber intraocular lens implantation, left eye.  SURGEON:  Bonne Dolores. Jashanti Clinkscale, MD  ANESTHESIA:  General endotracheal anesthesia.  OPERATIVE SUMMARY:  In the preoperative area, dilating drops were placed into the left eye.  The patient was then brought into the operating room where she was placed under general anesthesia.  The eye was then prepped and draped.  Beginning with a 75 blade, a paracentesis port was made at the surgeon's 2 o'clock position.  The anterior chamber was then filled with a 1% nonpreserved lidocaine solution with epinephrine.  This was followed by Viscoat to deepen the chamber.  A small fornix-based peritomy was performed superiorly.  Next, a single iris hook was placed through the limbus superiorly.  A 2.4-mm keratome blade was then used to make a clear corneal incision over the iris hook.  A bent cystotome needle and Utrata forceps were used to create a continuous tear capsulotomy.  Hydrodissection was performed using balanced salt solution on a fine cannula.  The lens nucleus was then removed using phacoemulsification in a quadrant cracking technique.  The cortical material was then removed with irrigation and aspiration.  The capsular bag and anterior chamber were refilled with Provisc.  The wound was widened to approximately 3 mm and a posterior chamber intraocular lens was placed into the capsular bag without difficulty  using an [employer] lens injecting system.  A single 10-0 nylon suture was then used to close the incision as well as stromal hydration.  The Provisc was removed from the anterior chamber and capsular bag with irrigation and aspiration.  At this point, the wounds were tested for leak, which were negative.  The anterior chamber remained deep and stable.  The patient tolerated the procedure well.  There were no operative complications, and she awoke from general anesthesia without problem.  No surgical specimens.  Prosthetic device used is Lenstec posterior chamber lens, model Softec HD, power of 21.75, serial number is [id number].          ______________________________ Susanne Greenhouse, MD     KEH/MEDQ  D:  07/24/2011  T:  07/24/2011  Job:  [id number]

## 2011-07-30 ENCOUNTER — Encounter (HOSPITAL_COMMUNITY): Payer: Self-pay | Admitting: Ophthalmology

## 2012-03-01 ENCOUNTER — Other Ambulatory Visit: Payer: Self-pay | Admitting: Cardiology

## 2012-10-19 ENCOUNTER — Ambulatory Visit (INDEPENDENT_AMBULATORY_CARE_PROVIDER_SITE_OTHER): Payer: Medicare Other | Admitting: Nurse Practitioner

## 2012-10-19 ENCOUNTER — Encounter: Payer: Self-pay | Admitting: Nurse Practitioner

## 2012-10-19 VITALS — BP 180/80 | HR 67 | Ht <= 58 in | Wt 105.8 lb

## 2012-10-19 DIAGNOSIS — R079 Chest pain, unspecified: Secondary | ICD-10-CM

## 2012-10-19 LAB — CBC WITH DIFFERENTIAL/PLATELET
Basophils Absolute: 0 10*3/uL (ref 0.0–0.1)
Basophils Relative: 0.6 % (ref 0.0–3.0)
Eosinophils Absolute: 0.2 10*3/uL (ref 0.0–0.7)
Eosinophils Relative: 4.3 % (ref 0.0–5.0)
HCT: 31 % — ABNORMAL LOW (ref 36.0–46.0)
Hemoglobin: 10 g/dL — ABNORMAL LOW (ref 12.0–15.0)
Lymphocytes Relative: 37.6 % (ref 12.0–46.0)
Lymphs Abs: 1.4 10*3/uL (ref 0.7–4.0)
MCHC: 32.3 g/dL (ref 30.0–36.0)
MCV: 80.9 fl (ref 78.0–100.0)
Monocytes Absolute: 0.3 10*3/uL (ref 0.1–1.0)
Monocytes Relative: 8.5 % (ref 3.0–12.0)
Neutro Abs: 1.8 10*3/uL (ref 1.4–7.7)
Neutrophils Relative %: 49 % (ref 43.0–77.0)
Platelets: 210 10*3/uL (ref 150.0–400.0)
RBC: 3.83 Mil/uL — ABNORMAL LOW (ref 3.87–5.11)
RDW: 16.6 % — ABNORMAL HIGH (ref 11.5–14.6)
WBC: 3.7 10*3/uL — ABNORMAL LOW (ref 4.5–10.5)

## 2012-10-19 LAB — BASIC METABOLIC PANEL
BUN: 18 mg/dL (ref 6–23)
CO2: 30 mEq/L (ref 19–32)
Calcium: 9.3 mg/dL (ref 8.4–10.5)
Chloride: 100 mEq/L (ref 96–112)
Creatinine, Ser: 0.8 mg/dL (ref 0.4–1.2)
GFR: 90.8 mL/min (ref >60.00)
Glucose, Bld: 146 mg/dL — ABNORMAL HIGH (ref 70–99)
Potassium: 4.1 mEq/L (ref 3.5–5.1)
Sodium: 137 mEq/L (ref 135–145)

## 2012-10-19 NOTE — Progress Notes (Signed)
Kelly Castillo Date of Birth: 18-Dec-1933 Medical Record #[id number]  History of Present Illness: [name] is seen today for a work in visit. She is seen for Dr. Jens Som. She has a history of a nonischemic Cm. Past catheterization in 2004 showed nonobstructive CAD and significant MR which was graded 3+ or more. EF was 41% at that time. TEE at that time showed MR that was felt to be 2 to 3+. Echo in 2009 showed EF of 50 to 55%, trace Ai and mild mitral and tricuspid regurgitation. Her other problems include HTN, DM, GERD, LBBB and osteoporosis. Her last labs that I see showed her to be anemic from 2012.   She was last here in March of 2012. EF had normalized. She was continued on her current medicines.   She comes in today. She is here with her daughter. 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 It was in the middle of her chest. Radiated to the left arm. She felt nauseated. Not sure how long this actually lasted, maybe just a few minutes. She was not really working that hard. Got an aspirin from her daughter and felt better. Still feels nauseated but no vomiting. Appetite has been ok. She is concerned. She also tells me that she has had for several months a feeling of pressure in her chest anytime she would go up steps or an incline. Now this is happening with just walking on flat ground. She will get short of breath as well. She has had no blood pressure medicines today. Has not been checking her blood pressure at home. Says her sugar has been ok. No recent labs reported. Remains anemic.   Current Outpatient Prescriptions on File Prior to Visit  Medication Sig Dispense Refill  . glimepiride (AMARYL) 4 MG tablet Take 4 mg by mouth 2 (two) times daily.        . hydrochlorothiazide (MICROZIDE) 12.5 MG capsule TAKE ONE CAPSULE BY MOUTH ONE TIME DAILY  30 capsule  10  . iron polysaccharides (FERREX 150) 150 MG capsule Take 150 mg by mouth  daily.        Marland Kitchen lisinopril (PRINIVIL,ZESTRIL) 40 MG tablet TAKE ONE TABLET BY MOUTH ONE TIME DAILY  30 tablet  10  . omeprazole (PRILOSEC) 20 MG capsule Take 20 mg by mouth daily.          No Known Allergies  Past Medical History  Diagnosis Date  . Non-ischemic cardiomyopathy   . Diabetes mellitus   . GERD (gastroesophageal reflux disease)   . LBBB (left bundle branch block)   . HTN (hypertension)   . Anemia   . Lumbar disc disease     Past Surgical History  Procedure Date  . Appendectomy 40 yrs ago    [place]  . Tubal ligation     [facility]  . Abdominal hysterectomy     [facility]  . Orif ankle fracture     right ankle-[facility]  . Cataract extraction w/phaco 07/24/2011    Procedure: CATARACT EXTRACTION PHACO AND INTRAOCULAR LENS PLACEMENT (IOC);  Surgeon: Gemma Payor;  Location: [facility];  Service: Ophthalmology;  Laterality: Left;  CDE: 14.73    History  Smoking status  . Never Smoker   Smokeless tobacco  . Not on file    History  Alcohol Use No    Family History  Problem Relation Age of Onset  . Anesthesia problems Neg Hx   .  Hypotension Neg Hx   . Malignant hyperthermia Neg Hx   . Pseudochol deficiency Neg Hx     Review of Systems: The review of systems is per the HPI.  All other systems were reviewed and are negative.  Physical Exam: BP 180/80  Pulse 67  Ht 4\' 10"  (1.473 m)  Wt 105 lb 12.8 oz (47.991 kg)  BMI 22.11 kg/m2  LABORATORY DATA:  EKG today shows sinus with L BBB.   ECHO from 2009:  - Left ventricular size was normal. - Overall left ventricular systolic function was at the lower limits of normal. - Left ventricular ejection fraction was estimated , range being 50 % to 55 %. - There were no left ventricular regional wall motion abnormalities. - Left ventricular wall thickness was normal. - There was moderate dyssynergic motion of the interventricular septum, consistent with a conduction abnormality or paced rhythm.  Doppler interpretation(s): -  Features were consistent with diastolic dysfunction.  Lab Results  Component Value Date   WBC 3.7* 07/18/2011   HGB 10.0* 07/18/2011   HCT 31.3* 07/18/2011   PLT 219 07/18/2011   GLUCOSE 194* 07/18/2011   NA 139 07/18/2011   K 4.4 07/18/2011   CL 102 07/18/2011   CREATININE 0.79 07/18/2011   BUN 21 07/18/2011   CO2 30 07/18/2011    Assessment / Plan:  1. Chest pain - remote cath in 2004 showed nonobstructive CAD - has CV risk factors of HTN and DM. Her cath was 10 years ago. Her symptoms are quite concerning. I have recommended repeat cardiac catheterization or at least a Lexiscan to further define. She and her daughter did not wish to pursue or discuss this course of treatment with me. They prefer a visit with Dr. Jens Som.   2. Nonischemic CM - EF had normalized by her last echo in 2009. Has had past valvular heart disease which seemed to have improved. She is agreeable to getting another echo and labs today in the interim with follow up with Dr. Jens Som.   3. HTN - no medicines today. I have asked her to take her medicines when she gets back home and try to check some blood pressure's at home for Dr. Ludwig Clarks review.   Patient and her daughter are agreeable to this plan and will call if any problems develop in the interim.

## 2012-10-19 NOTE — Patient Instructions (Addendum)
We will get an ultrasound of your heart   We will check labs today  We will get you back to see Dr. Jens Som for discussion and follow up  For now, stay on your current medicines  Try to check some blood pressures at home and keep a diary for Dr. Jens Som to look at.  Call the [facility] office at [phone] if you have any questions, problems or concerns.

## 2012-10-27 ENCOUNTER — Ambulatory Visit (HOSPITAL_COMMUNITY): Payer: Medicare Other | Attending: Nurse Practitioner | Admitting: Radiology

## 2012-10-27 DIAGNOSIS — I447 Left bundle-branch block, unspecified: Secondary | ICD-10-CM | POA: Insufficient documentation

## 2012-10-27 DIAGNOSIS — R072 Precordial pain: Secondary | ICD-10-CM | POA: Insufficient documentation

## 2012-10-27 DIAGNOSIS — E119 Type 2 diabetes mellitus without complications: Secondary | ICD-10-CM | POA: Insufficient documentation

## 2012-10-27 DIAGNOSIS — I428 Other cardiomyopathies: Secondary | ICD-10-CM | POA: Insufficient documentation

## 2012-10-27 DIAGNOSIS — R079 Chest pain, unspecified: Secondary | ICD-10-CM

## 2012-10-27 NOTE — Progress Notes (Signed)
Echocardiogram performed.  

## 2012-11-25 ENCOUNTER — Ambulatory Visit: Payer: Medicare Other | Admitting: Cardiology

## 2013-03-21 ENCOUNTER — Encounter: Payer: Self-pay | Admitting: Cardiology

## 2013-03-21 ENCOUNTER — Encounter: Payer: Self-pay | Admitting: *Deleted

## 2013-03-21 ENCOUNTER — Ambulatory Visit (INDEPENDENT_AMBULATORY_CARE_PROVIDER_SITE_OTHER): Payer: Medicare Other | Admitting: Cardiology

## 2013-03-21 ENCOUNTER — Other Ambulatory Visit: Payer: Self-pay | Admitting: Cardiology

## 2013-03-21 VITALS — BP 140/62 | HR 61 | Ht <= 58 in | Wt 108.0 lb

## 2013-03-21 DIAGNOSIS — I10 Essential (primary) hypertension: Secondary | ICD-10-CM

## 2013-03-21 DIAGNOSIS — I208 Other forms of angina pectoris: Secondary | ICD-10-CM

## 2013-03-21 DIAGNOSIS — I428 Other cardiomyopathies: Secondary | ICD-10-CM

## 2013-03-21 DIAGNOSIS — R079 Chest pain, unspecified: Secondary | ICD-10-CM

## 2013-03-21 DIAGNOSIS — Z01810 Encounter for preprocedural cardiovascular examination: Secondary | ICD-10-CM

## 2013-03-21 DIAGNOSIS — I080 Rheumatic disorders of both mitral and aortic valves: Secondary | ICD-10-CM

## 2013-03-21 LAB — CBC WITH DIFFERENTIAL/PLATELET
Basophils Absolute: 0 10*3/uL (ref 0.0–0.1)
Basophils Relative: 0.8 % (ref 0.0–3.0)
Eosinophils Absolute: 0.1 10*3/uL (ref 0.0–0.7)
Eosinophils Relative: 3 % (ref 0.0–5.0)
HCT: 33.2 % — ABNORMAL LOW (ref 36.0–46.0)
Hemoglobin: 11.2 g/dL — ABNORMAL LOW (ref 12.0–15.0)
Lymphocytes Relative: 34.7 % (ref 12.0–46.0)
Lymphs Abs: 1.3 10*3/uL (ref 0.7–4.0)
MCHC: 33.6 g/dL (ref 30.0–36.0)
MCV: 80.7 fl (ref 78.0–100.0)
Monocytes Absolute: 0.4 10*3/uL (ref 0.1–1.0)
Monocytes Relative: 9.6 % (ref 3.0–12.0)
Neutro Abs: 2 10*3/uL (ref 1.4–7.7)
Neutrophils Relative %: 51.9 % (ref 43.0–77.0)
Platelets: 214 10*3/uL (ref 150.0–400.0)
RBC: 4.11 Mil/uL (ref 3.87–5.11)
RDW: 15.1 % — ABNORMAL HIGH (ref 11.5–14.6)
WBC: 3.9 10*3/uL — ABNORMAL LOW (ref 4.5–10.5)

## 2013-03-21 LAB — BASIC METABOLIC PANEL
BUN: 19 mg/dL (ref 6–23)
CO2: 29 mEq/L (ref 19–32)
Calcium: 9.8 mg/dL (ref 8.4–10.5)
Chloride: 100 mEq/L (ref 96–112)
Creatinine, Ser: 0.9 mg/dL (ref 0.4–1.2)
GFR: 74.22 mL/min (ref >60.00)
Glucose, Bld: 147 mg/dL — ABNORMAL HIGH (ref 70–99)
Potassium: 4.1 mEq/L (ref 3.5–5.1)
Sodium: 137 mEq/L (ref 135–145)

## 2013-03-21 LAB — PROTIME-INR
INR: 1.2 ratio — ABNORMAL HIGH (ref 0.8–1.0)
Prothrombin Time: 12.4 s (ref 10.2–12.4)

## 2013-03-21 MED ORDER — ASPIRIN EC 81 MG PO TBEC: 81.0000 mg | DELAYED_RELEASE_TABLET | Freq: Every day | ORAL | Status: DC

## 2013-03-21 MED ORDER — ATORVASTATIN CALCIUM 40 MG PO TABS: 40.0000 mg | ORAL_TABLET | Freq: Every day | ORAL | Status: DC

## 2013-03-21 NOTE — Progress Notes (Signed)
HPI: [name] is a female who I have seen previously for a history of nonischemic cardiomyopathy. A previous catheterization performed in 2004, showed nonobstructive coronary disease and significant mitral regurgitation and graded 3+ or more. Her ejection fraction was 41% at the time. A TEE at that time showed mitral regurgitation that was felt to be 2-3+. However, her most recent echocardiogram was performed in December 2013. Ejection fraction was 50-55%; no significant mitral regurgitation. Patient seen in December with an episode of chest pain. Functional study versus catheterization recommended but patient preferred to discuss this with me. Since that time, the patient describes intermittent chest pain. The pain occurs with more extreme activities but not routine activities. It is relieved with rest. No radiation. Pain is not pleuritic or positional.   Current Outpatient Prescriptions  Medication Sig Dispense Refill  . glimepiride (AMARYL) 4 MG tablet Take 4 mg by mouth 2 (two) times daily.        . hydrochlorothiazide (MICROZIDE) 12.5 MG capsule TAKE ONE CAPSULE BY MOUTH ONE TIME DAILY  30 capsule  10  . iron polysaccharides (FERREX 150) 150 MG capsule Take 150 mg by mouth daily.        Marland Kitchen lisinopril (PRINIVIL,ZESTRIL) 40 MG tablet TAKE ONE TABLET BY MOUTH ONE TIME DAILY  30 tablet  10  . omeprazole (PRILOSEC) 20 MG capsule Take 20 mg by mouth daily.         No current facility-administered medications for this visit.     Past Medical History  Diagnosis Date  . Non-ischemic cardiomyopathy   . Diabetes mellitus   . GERD (gastroesophageal reflux disease)   . LBBB (left bundle branch block)   . HTN (hypertension)   . Anemia   . Lumbar disc disease     Past Surgical History  Procedure Laterality Date  . Appendectomy  40 yrs ago    [place]  . Tubal ligation      [facility]  . Abdominal hysterectomy      [facility]  . Orif ankle fracture      right ankle-[facility]  . Cataract extraction  w/phaco  07/24/2011    Procedure: CATARACT EXTRACTION PHACO AND INTRAOCULAR LENS PLACEMENT (IOC);  Surgeon: Gemma Payor;  Location: [facility];  Service: Ophthalmology;  Laterality: Left;  CDE: 14.73    History   Social History  . Marital Status: Divorced    Spouse Name: N/A    Number of Children: N/A  . Years of Education: N/A   Occupational History  . Not on file.   Social History Main Topics  . Smoking status: Never Smoker   . Smokeless tobacco: Not on file  . Alcohol Use: No  . Drug Use: No  . Sexually Active: Yes    Birth Control/ Protection: Surgical   Other Topics Concern  . Not on file   Social History Narrative  . No narrative on file    ROS: no fevers or chills, productive cough, hemoptysis, dysphasia, odynophagia, melena, hematochezia, dysuria, hematuria, rash, seizure activity, orthopnea, PND, pedal edema, claudication. Remaining systems are negative.  Physical Exam: Well-developed well-nourished in no acute distress.  Skin is warm and dry.  HEENT is normal.  Neck is supple.  Chest is clear to auscultation with normal expansion.  Cardiovascular exam is regular rate and rhythm.  Abdominal exam nontender or distended. No masses palpated. Extremities show no edema. neuro grossly intact  ECG sinus rhythm at a rate of 61. Left bundle branch block.

## 2013-03-21 NOTE — Assessment & Plan Note (Signed)
Not significant on most recent echo.

## 2013-03-21 NOTE — Assessment & Plan Note (Signed)
Improved by most recent echocardiogram. Continue ACE inhibitor.

## 2013-03-21 NOTE — Assessment & Plan Note (Signed)
Patient symptoms are concerning for angina. She has a long history of diabetes mellitus and a left bundle branch block. I discussed the options today including cardiac catheterization versus functional study. I have recommended catheterization for definitive evaluation. The risks and benefits were discussed and the patient agrees to proceed. Continue aspirin. Given her diabetes mellitus as coronary artery disease equivalent I will add Lipitor 40 mg daily. Check lipids and liver in 6 weeks.

## 2013-03-21 NOTE — Assessment & Plan Note (Signed)
Continue present medications. 

## 2013-03-21 NOTE — Patient Instructions (Addendum)
Your physician has requested that you have a cardiac catheterization. Cardiac catheterization is used to diagnose and/or treat various heart conditions. Doctors may recommend this procedure for a number of different reasons. The most common reason is to evaluate chest pain. Chest pain can be a symptom of coronary artery disease (CAD), and cardiac catheterization can show whether plaque is narrowing or blocking your heart's arteries. This procedure is also used to evaluate the valves, as well as measure the blood flow and oxygen levels in different parts of your heart. For further information please visit [email or web address] Please follow instruction sheet, as given.   Your physician recommends that you HAVE LAB WORK TODAY  A chest x-ray takes a picture of the organs and structures inside the chest, including the heart, lungs, and blood vessels. This test can show several things, including, whether the heart is enlarges; whether fluid is building up in the lungs; and whether pacemaker / defibrillator leads are still in place. AT [address]  START LIPITOR 40 MG ONCE DAILY  Your physician recommends that you return for lab work in: 6 WEEKS AFTER STARTING LIPITOR= DO NOT EAT PRIOR TO LAB WORK

## 2013-03-21 NOTE — Addendum Note (Signed)
Addended by: Freddi Starr on: 03/21/2013 11:46 AM   Modules accepted: Orders

## 2013-03-23 ENCOUNTER — Ambulatory Visit (HOSPITAL_COMMUNITY)
Admission: RE | Admit: 2013-03-23 | Discharge: 2013-03-23 | Disposition: A | Discharge status: Home / Self Care | Payer: Medicare Other | Source: Ambulatory Visit | Attending: Cardiology | Admitting: Cardiology

## 2013-03-23 DIAGNOSIS — I080 Rheumatic disorders of both mitral and aortic valves: Secondary | ICD-10-CM

## 2013-03-23 DIAGNOSIS — Z01810 Encounter for preprocedural cardiovascular examination: Secondary | ICD-10-CM

## 2013-03-23 DIAGNOSIS — E119 Type 2 diabetes mellitus without complications: Secondary | ICD-10-CM | POA: Insufficient documentation

## 2013-03-23 DIAGNOSIS — I428 Other cardiomyopathies: Secondary | ICD-10-CM

## 2013-03-23 DIAGNOSIS — R079 Chest pain, unspecified: Secondary | ICD-10-CM | POA: Insufficient documentation

## 2013-03-23 DIAGNOSIS — I10 Essential (primary) hypertension: Secondary | ICD-10-CM

## 2013-03-29 ENCOUNTER — Telehealth: Payer: Self-pay | Admitting: Cardiology

## 2013-03-29 NOTE — Telephone Encounter (Signed)
F/u ° ° °Pt returned your call °

## 2013-03-29 NOTE — Telephone Encounter (Signed)
Spoke with pt, she is uncertain if she will be able to get to the cath tomorrow or not. She was given the [phone] number to call in the morning if she is not able to make the appt.

## 2013-03-29 NOTE — Telephone Encounter (Signed)
New problem   Pt need to cancel her procedure for tomorrow 03/30/13 for heart cath. Please call pt

## 2013-03-29 NOTE — Telephone Encounter (Signed)
Left message for pt to call.

## 2013-03-30 ENCOUNTER — Inpatient Hospital Stay (HOSPITAL_BASED_OUTPATIENT_CLINIC_OR_DEPARTMENT_OTHER): Admission: RE | Admit: 2013-03-30 | Payer: Medicare Other | Source: Ambulatory Visit | Admitting: Cardiovascular Disease

## 2013-03-30 ENCOUNTER — Encounter (HOSPITAL_BASED_OUTPATIENT_CLINIC_OR_DEPARTMENT_OTHER): Admission: RE | Payer: Self-pay | Source: Ambulatory Visit

## 2013-03-30 SURGERY — JV LEFT HEART CATHETERIZATION WITH CORONARY ANGIOGRAM
Anesthesia: Moderate Sedation

## 2013-03-31 ENCOUNTER — Other Ambulatory Visit: Payer: Self-pay | Admitting: Cardiology

## 2014-01-31 ENCOUNTER — Encounter (HOSPITAL_COMMUNITY)
Admission: EM | Disposition: A | Discharge status: Home / Self Care | Payer: Self-pay | Source: Home / Self Care | Attending: Emergency Medicine

## 2014-01-31 ENCOUNTER — Emergency Department (HOSPITAL_COMMUNITY): Payer: Medicare HMO

## 2014-01-31 ENCOUNTER — Encounter (HOSPITAL_COMMUNITY): Payer: Self-pay | Admitting: Emergency Medicine

## 2014-01-31 ENCOUNTER — Observation Stay (HOSPITAL_COMMUNITY)
Admission: EM | Admit: 2014-01-31 | Discharge: 2014-01-31 | Disposition: A | Discharge status: Home / Self Care | Payer: Medicare HMO | Attending: Cardiology | Admitting: Cardiology

## 2014-01-31 ENCOUNTER — Other Ambulatory Visit: Payer: Self-pay

## 2014-01-31 DIAGNOSIS — R0789 Other chest pain: Principal | ICD-10-CM | POA: Insufficient documentation

## 2014-01-31 DIAGNOSIS — R110 Nausea: Secondary | ICD-10-CM | POA: Insufficient documentation

## 2014-01-31 DIAGNOSIS — D649 Anemia, unspecified: Secondary | ICD-10-CM | POA: Insufficient documentation

## 2014-01-31 DIAGNOSIS — K219 Gastro-esophageal reflux disease without esophagitis: Secondary | ICD-10-CM | POA: Insufficient documentation

## 2014-01-31 DIAGNOSIS — Z79899 Other long term (current) drug therapy: Secondary | ICD-10-CM | POA: Insufficient documentation

## 2014-01-31 DIAGNOSIS — I428 Other cardiomyopathies: Secondary | ICD-10-CM | POA: Insufficient documentation

## 2014-01-31 DIAGNOSIS — R079 Chest pain, unspecified: Secondary | ICD-10-CM

## 2014-01-31 DIAGNOSIS — E119 Type 2 diabetes mellitus without complications: Secondary | ICD-10-CM | POA: Insufficient documentation

## 2014-01-31 DIAGNOSIS — I447 Left bundle-branch block, unspecified: Secondary | ICD-10-CM | POA: Insufficient documentation

## 2014-01-31 DIAGNOSIS — Z7982 Long term (current) use of aspirin: Secondary | ICD-10-CM | POA: Insufficient documentation

## 2014-01-31 DIAGNOSIS — I10 Essential (primary) hypertension: Secondary | ICD-10-CM | POA: Insufficient documentation

## 2014-01-31 DIAGNOSIS — M519 Unspecified thoracic, thoracolumbar and lumbosacral intervertebral disc disorder: Secondary | ICD-10-CM | POA: Insufficient documentation

## 2014-01-31 DIAGNOSIS — I2510 Atherosclerotic heart disease of native coronary artery without angina pectoris: Secondary | ICD-10-CM

## 2014-01-31 HISTORY — PX: LEFT HEART CATHETERIZATION WITH CORONARY ANGIOGRAM: SHX5451

## 2014-01-31 HISTORY — PX: CARDIAC CATHETERIZATION: SHX172

## 2014-01-31 LAB — BASIC METABOLIC PANEL
BUN: 21 mg/dL (ref 6–23)
CO2: 28 mEq/L (ref 19–32)
Calcium: 10.2 mg/dL (ref 8.4–10.5)
Chloride: 96 mEq/L (ref 96–112)
Creatinine, Ser: 0.91 mg/dL (ref 0.50–1.10)
GFR calc Af Amer: 68 mL/min — ABNORMAL LOW (ref >90)
GFR calc non Af Amer: 59 mL/min — ABNORMAL LOW (ref >90)
Glucose, Bld: 124 mg/dL — ABNORMAL HIGH (ref 70–99)
Potassium: 3.9 mEq/L (ref 3.7–5.3)
Sodium: 136 mEq/L — ABNORMAL LOW (ref 137–147)

## 2014-01-31 LAB — GLUCOSE, CAPILLARY: Glucose-Capillary: 169 mg/dL — ABNORMAL HIGH (ref 70–99)

## 2014-01-31 LAB — CBC
HCT: 33.6 % — ABNORMAL LOW (ref 36.0–46.0)
Hemoglobin: 11.3 g/dL — ABNORMAL LOW (ref 12.0–15.0)
MCH: 27 pg (ref 26.0–34.0)
MCHC: 33.6 g/dL (ref 30.0–36.0)
MCV: 80.2 fL (ref 78.0–100.0)
Platelets: 233 10*3/uL (ref 150–400)
RBC: 4.19 MIL/uL (ref 3.87–5.11)
RDW: 14.9 % (ref 11.5–15.5)
WBC: 4.3 10*3/uL (ref 4.0–10.5)

## 2014-01-31 LAB — I-STAT TROPONIN, ED: Troponin i, poc: 0 ng/mL (ref 0.00–0.08)

## 2014-01-31 LAB — TROPONIN I
Troponin I: <0.3 ng/mL (ref <0.30)
Troponin I: <0.3 ng/mL (ref <0.30)
Troponin I: <0.3 ng/mL (ref <0.30)

## 2014-01-31 LAB — PROTIME-INR
INR: 1.01 (ref 0.00–1.49)
Prothrombin Time: 13.1 seconds (ref 11.6–15.2)

## 2014-01-31 SURGERY — LEFT HEART CATHETERIZATION WITH CORONARY ANGIOGRAM
Anesthesia: LOCAL

## 2014-01-31 MED ORDER — ASPIRIN 81 MG PO CHEW
CHEWABLE_TABLET | ORAL | Status: AC
  Filled 2014-01-31: qty 4

## 2014-01-31 MED ORDER — SODIUM CHLORIDE 0.9 % IV SOLN
INTRAVENOUS | Status: DC
  Administered 2014-01-31: 09:00:00 via INTRAVENOUS

## 2014-01-31 MED ORDER — HEPARIN (PORCINE) IN NACL 100-0.45 UNIT/ML-% IJ SOLN
550.0000 [IU]/h | INTRAMUSCULAR | Status: DC
  Administered 2014-01-31: 550 [IU]/h via INTRAVENOUS
  Filled 2014-01-31: qty 250

## 2014-01-31 MED ORDER — METOPROLOL TARTRATE 12.5 MG HALF TABLET
12.5000 mg | ORAL_TABLET | Freq: Two times a day (BID) | ORAL | Status: DC
  Administered 2014-01-31: 12.5 mg via ORAL
  Filled 2014-01-31 (×2): qty 1

## 2014-01-31 MED ORDER — SODIUM CHLORIDE 0.9 % IJ SOLN: 3.0000 mL | INTRAMUSCULAR | Status: DC | PRN

## 2014-01-31 MED ORDER — ALENDRONATE SODIUM 70 MG PO TABS: 70.0000 mg | ORAL_TABLET | ORAL | Status: DC

## 2014-01-31 MED ORDER — ASPIRIN 300 MG RE SUPP
300.0000 mg | RECTAL | Status: AC
  Filled 2014-01-31: qty 1

## 2014-01-31 MED ORDER — FENTANYL CITRATE 0.05 MG/ML IJ SOLN
INTRAMUSCULAR | Status: AC
  Filled 2014-01-31: qty 2

## 2014-01-31 MED ORDER — ASPIRIN EC 81 MG PO TBEC: 81.0000 mg | DELAYED_RELEASE_TABLET | Freq: Every day | ORAL | Status: DC

## 2014-01-31 MED ORDER — SODIUM CHLORIDE 0.9 % IJ SOLN: 3.0000 mL | Freq: Two times a day (BID) | INTRAMUSCULAR | Status: DC

## 2014-01-31 MED ORDER — SODIUM CHLORIDE 0.9 % IV SOLN: 1.0000 mL/kg/h | INTRAVENOUS | Status: DC

## 2014-01-31 MED ORDER — MIDAZOLAM HCL 2 MG/2ML IJ SOLN
INTRAMUSCULAR | Status: AC
Start: 2014-01-31 — End: 2014-01-31
  Filled 2014-01-31: qty 2

## 2014-01-31 MED ORDER — LIDOCAINE HCL (PF) 1 % IJ SOLN
INTRAMUSCULAR | Status: AC
  Filled 2014-01-31: qty 30

## 2014-01-31 MED ORDER — HEPARIN (PORCINE) IN NACL 2-0.9 UNIT/ML-% IJ SOLN
INTRAMUSCULAR | Status: AC
  Filled 2014-01-31: qty 1000

## 2014-01-31 MED ORDER — HYDROCHLOROTHIAZIDE 12.5 MG PO CAPS
12.5000 mg | ORAL_CAPSULE | Freq: Every day | ORAL | Status: DC
  Filled 2014-01-31: qty 1

## 2014-01-31 MED ORDER — NITROGLYCERIN 0.2 MG/ML ON CALL CATH LAB
INTRAVENOUS | Status: AC
  Filled 2014-01-31: qty 1

## 2014-01-31 MED ORDER — HEPARIN BOLUS VIA INFUSION
2000.0000 [IU] | Freq: Once | INTRAVENOUS | Status: AC
  Administered 2014-01-31: 2000 [IU] via INTRAVENOUS
  Filled 2014-01-31: qty 2000

## 2014-01-31 MED ORDER — ATORVASTATIN CALCIUM 20 MG PO TABS: 20.0000 mg | ORAL_TABLET | Freq: Every day | ORAL | Status: DC

## 2014-01-31 MED ORDER — ATORVASTATIN CALCIUM 20 MG PO TABS
20.0000 mg | ORAL_TABLET | Freq: Every day | ORAL | Status: DC
  Administered 2014-01-31: 20 mg via ORAL
  Filled 2014-01-31: qty 1

## 2014-01-31 MED ORDER — METOPROLOL TARTRATE 25 MG PO TABS: 12.5000 mg | ORAL_TABLET | Freq: Two times a day (BID) | ORAL | Status: DC

## 2014-01-31 MED ORDER — LISINOPRIL 40 MG PO TABS
40.0000 mg | ORAL_TABLET | Freq: Every day | ORAL | Status: DC
  Administered 2014-01-31: 40 mg via ORAL
  Filled 2014-01-31: qty 1

## 2014-01-31 MED ORDER — ASPIRIN 81 MG PO CHEW
324.0000 mg | CHEWABLE_TABLET | ORAL | Status: AC
  Administered 2014-01-31: 324 mg via ORAL
  Filled 2014-01-31: qty 4

## 2014-01-31 MED ORDER — NITROGLYCERIN 0.4 MG SL SUBL
0.4000 mg | SUBLINGUAL_TABLET | SUBLINGUAL | Status: DC | PRN
Start: 2014-01-31 — End: 2014-01-31

## 2014-01-31 MED ORDER — POLYSACCHARIDE IRON COMPLEX 150 MG PO CAPS
150.0000 mg | ORAL_CAPSULE | Freq: Every day | ORAL | Status: DC
  Administered 2014-01-31: 150 mg via ORAL
  Filled 2014-01-31: qty 1

## 2014-01-31 MED ORDER — SODIUM CHLORIDE 0.9 % IV SOLN: 250.0000 mL | INTRAVENOUS | Status: DC | PRN

## 2014-01-31 NOTE — ED Notes (Signed)
Pt denies chest pain at this time.

## 2014-01-31 NOTE — CV Procedure (Signed)
    Cardiac Catheterization Procedure Note  Name: TKIA BOLERJACK MRN: [id number] DOB: 22-Jun-1934  Procedure: Left Heart Cath, Selective Coronary Angiography, LV angiography  Indication: 80 yo BF with history of nonischemic cardiomyopathy presents with symptoms of refractory chest pain.   Procedural Details: The right wrist was prepped, draped, and anesthetized with 1% lidocaine. Using the modified Seldinger technique, a 5 French sheath was introduced into the right radial artery. 3 mg of verapamil was administered through the sheath, weight-based unfractionated heparin was administered intravenously. Standard Judkins catheters were used for selective coronary angiography and left ventriculography. Catheter exchanges were performed over an exchange length guidewire. There were no immediate procedural complications. A TR band was used for radial hemostasis at the completion of the procedure.  The patient was transferred to the post catheterization recovery area for further monitoring.  Procedural Findings: Hemodynamics: AO 144/58 mean 86 mm Hg LV 141/8 mm Hg  Coronary angiography: Coronary dominance: right  Left mainstem: Normal  Left anterior descending (LAD): There is a long 30-40% stenosis in the proximal LAD  Left circumflex (LCx): There is 20% disease in the mid LCx.  Right coronary artery (RCA): Dominant. Normal.   Left ventriculography: Left ventricular systolic function is normal, LVEF is estimated at 55-65%, there is mild mitral regurgitation   Final Conclusions:   1. Nonobstructive CAD 2. Normal LV function.  Recommendations: medical management.  Kamari Buch [place] MD,FACC  01/31/2014, 3:27 PM

## 2014-01-31 NOTE — ED Notes (Signed)
Attempted to call report but unable to at this time.

## 2014-01-31 NOTE — H&P (Addendum)
HPI: Evaluate chest pain; history of nonischemic cardiomyopathy. Catheterization performed in 2004 showed nonobstructive coronary disease and significant mitral regurgitation graded 3+ or more. Her ejection fraction was 41% at the time. A TEE at that time showed mitral regurgitation that was felt to be 2-3+. However, her most recent echocardiogram was performed in December 2013. Ejection fraction was 50-55%; no significant mitral regurgitation. Patient has had intermittent chest pain and cardiac catheterization was recommended in May of 2014. However she apparently canceled this because of lack of transportation. She has continued to have substernal chest pain. This occurs with exertion and is relieved with rest. It does not radiate. No associated symptoms. She has been under stress at home. She had chest pain yesterday described as substernal and sharp. Not pleuritic, positional or related to food. No radiation or associated symptoms. Symptoms lasted approximately 30 minutes to one hour and resolveed with aspirin. Presently pain-free.    (Not in a hospital admission)  No Known Allergies  Past Medical History  Diagnosis Date  . Non-ischemic cardiomyopathy   . Diabetes mellitus   . GERD (gastroesophageal reflux disease)   . LBBB (left bundle branch block)   . HTN (hypertension)   . Anemia   . Lumbar disc disease     Past Surgical History  Procedure Laterality Date  . Appendectomy  40 yrs ago    [place]  . Tubal ligation      [facility]  . Abdominal hysterectomy      [facility]  . Orif ankle fracture      right ankle-[facility]  . Cataract extraction w/phaco  07/24/2011    Procedure: CATARACT EXTRACTION PHACO AND INTRAOCULAR LENS PLACEMENT (IOC);  Surgeon: Tonny Branch;  Location: [facility];  Service: Ophthalmology;  Laterality: Left;  CDE: 14.73    History   Social History  . Marital Status: Divorced    Spouse Name: N/A    Number of Children: 28  . Years of Education: N/A   Occupational  History  . Not on file.   Social History Main Topics  . Smoking status: Never Smoker   . Smokeless tobacco: Not on file  . Alcohol Use: No  . Drug Use: No  . Sexual Activity: Yes    Birth Control/ Protection: Surgical   Other Topics Concern  . Not on file   Social History Narrative  . No narrative on file    Family History  Problem Relation Age of Onset  . Anesthesia problems Neg Hx   . Hypotension Neg Hx   . Malignant hyperthermia Neg Hx   . Pseudochol deficiency Neg Hx     ROS:  no fevers or chills, productive cough, hemoptysis, dysphasia, odynophagia, melena, hematochezia, dysuria, hematuria, rash, seizure activity, orthopnea, PND, pedal edema, claudication. Remaining systems are negative.  Physical Exam:   Blood pressure 134/57, pulse 75, temperature 97.8 F (36.6 C), temperature source Oral, resp. rate 14, SpO2 100.00%.  General:  Well developed/well nourished in NAD Skin warm/dry Patient not depressed No peripheral clubbing Back-normal HEENT-normal/normal eyelids Neck supple/normal carotid upstroke bilaterally; no bruits; no JVD; no thyromegaly chest - CTA/ normal expansion CV - RRR/normal S1 and S2; no murmurs, rubs or gallops;  PMI nondisplaced Abdomen -NT/ND, no HSM, no mass, + bowel sounds, no bruit 2+ femoral pulses, no bruits Ext-no edema, chords, 2+ DP Neuro-grossly nonfocal  ECG Sinus with LBBB  Results for orders placed during the hospital encounter of 01/31/14 (from the past 48 hour(s))  CBC  Status: Abnormal   Collection Time    01/31/14 12:56 AM      Result Value Ref Range   WBC 4.3  4.0 - 10.5 K/uL   RBC 4.19  3.87 - 5.11 MIL/uL   Hemoglobin 11.3 (*) 12.0 - 15.0 g/dL   HCT 33.6 (*) 36.0 - 46.0 %   MCV 80.2  78.0 - 100.0 fL   MCH 27.0  26.0 - 34.0 pg   MCHC 33.6  30.0 - 36.0 g/dL   RDW 14.9  11.5 - 15.5 %   Platelets 233  150 - 400 K/uL   Comment: PLATELET COUNT CONFIRMED BY SMEAR     LARGE PLATELETS PRESENT  BASIC METABOLIC PANEL      Status: Abnormal   Collection Time    01/31/14 12:56 AM      Result Value Ref Range   Sodium 136 (*) 137 - 147 mEq/L   Potassium 3.9  3.7 - 5.3 mEq/L   Chloride 96  96 - 112 mEq/L   CO2 28  19 - 32 mEq/L   Glucose, Bld 124 (*) 70 - 99 mg/dL   BUN 21  6 - 23 mg/dL   Creatinine, Ser 0.91  0.50 - 1.10 mg/dL   Calcium 10.2  8.4 - 10.5 mg/dL   GFR calc non Af Amer 59 (*) >90 mL/min   GFR calc Af Amer 68 (*) >90 mL/min   Comment: (NOTE)     The eGFR has been calculated using the CKD EPI equation.     This calculation has not been validated in all clinical situations.     eGFR's persistently <90 mL/min signify possible Chronic Kidney     Disease.  I-STAT TROPOININ, ED     Status: None   Collection Time    01/31/14  1:04 AM      Result Value Ref Range   Troponin i, poc 0.00  0.00 - 0.08 ng/mL   Comment 3            Comment: Due to the release kinetics of cTnI,     a negative result within the first hours     of the onset of symptoms does not rule out     myocardial infarction with certainty.     If myocardial infarction is still suspected,     repeat the test at appropriate intervals.  TROPONIN I     Status: None   Collection Time    01/31/14  4:37 AM      Result Value Ref Range   Troponin I <0.30  <0.30 ng/mL   Comment:            Due to the release kinetics of cTnI,     a negative result within the first hours     of the onset of symptoms does not rule out     myocardial infarction with certainty.     If myocardial infarction is still suspected,     repeat the test at appropriate intervals.    Dg Chest 2 View  01/31/2014   CLINICAL DATA:  Mid chest pain.  EXAM: CHEST  2 VIEW  COMPARISON:  PA and lateral chest 03/23/2013.  FINDINGS: Lungs are clear. Heart size is normal. No pneumothorax or pleural effusion. No focal bony abnormality.  IMPRESSION: Negative chest.   Electronically Signed   By: Thomas  Dalessio M.D.   On: 01/31/2014 01:32    Assessment/Plan 1 chest  pain-symptoms are concerning for angina. Cardiac catheterization   was recommended previously but canceled because of transportation issues. I feel definitive evaluation is warranted. The risks and benefits of cardiac catheterization were discussed and the patient agrees to proceed. Hydrate prior to the procedure. Treat with ASA, heparin, low dose lopressor and statin 2 history of moderate mitral regurgitation-no evidence on most recent echocardiogram. Repeat echo as an outpatient. 3 diabetes mellitus-hold oral hypoglycemic today. Resume as an outpatient. 4 hypertension-continue preadmission blood pressure medications. 5 history of nonischemic cardiomyopathy-improved on most recent echocardiogram. 6 normocytic anemia-followup primary care.   MD 01/31/2014, 8:32 AM   

## 2014-01-31 NOTE — ED Notes (Signed)
MD at bedside. 

## 2014-01-31 NOTE — Progress Notes (Signed)
ANTICOAGULATION CONSULT NOTE - Initial Consult  Pharmacy Consult for heparin Indication: chest pain/ACS  No Known Allergies  Patient Measurements: Height: 4\' 9"  (144.8 cm) Weight: 99 lb 12.8 oz (45.269 kg) IBW/kg (Calculated) : 38.6 Heparin Dosing Weight: 45 kg  Vital Signs: Temp: 98.2 F (36.8 C) (03/17 1038) Temp src: Oral (03/17 1038) BP: 139/53 mmHg (03/17 1038) Pulse Rate: 68 (03/17 1038)  Labs:  Recent Labs  01/31/14 0056 01/31/14 0437  HGB 11.3*  --   HCT 33.6*  --   PLT 233  --   CREATININE 0.91  --   TROPONINI  --  <0.30    Estimated Creatinine Clearance: 31 ml/min (by C-G formula based on Cr of 0.91).   Medical History: Past Medical History  Diagnosis Date  . Non-ischemic cardiomyopathy   . Diabetes mellitus   . GERD (gastroesophageal reflux disease)   . LBBB (left bundle branch block)   . HTN (hypertension)   . Anemia   . Lumbar disc disease     Medications:  See med rec  Assessment: Patient is a 80 y.o F presented to the ED with c/o CP with plan for cardiac cath procedure today.  To start heparin for r/o ACS.  Goal of Therapy:  Heparin level 0.3-0.7 units/ml Monitor platelets by anticoagulation protocol: Yes   Plan:  1) heparin 2000 units IV bouls x1, then drip at 550 units/hr 2) check 8 hour heparin level at 8 PM if patient has not gone to cath by then 3) f/u after cath  Nashya Garlington P 01/31/2014,11:11 AM

## 2014-01-31 NOTE — Interval H&P Note (Signed)
History and Physical Interval Note:  01/31/2014 2:54 PM  Kelly Castillo  has presented today for surgery, with the diagnosis of cp  The various methods of treatment have been discussed with the patient and family. After consideration of risks, benefits and other options for treatment, the patient has consented to  Procedure(s): LEFT HEART CATHETERIZATION WITH CORONARY ANGIOGRAM (N/A) as a surgical intervention .  The patient's history has been reviewed, patient examined, no change in status, stable for surgery.  I have reviewed the patient's chart and labs.  Questions were answered to the patient's satisfaction.    Cath Lab Visit (complete for each Cath Lab visit)  Clinical Evaluation Leading to the Procedure:   ACS: no  Non-ACS:    Anginal Classification: CCS III  Anti-ischemic medical therapy: No Therapy  Non-Invasive Test Results: No non-invasive testing performed  Prior CABG: No previous CABG       Theron Arista [facility] 01/31/2014 2:54 PM

## 2014-01-31 NOTE — ED Notes (Addendum)
Presents with sternal chest pain began at midnight radiates to left arm associated with nausea, pain is described as dull. Denies SOB,  denies dizziness. Pain began while pt was sitting at home and going through things that were stressful. Aspirin made pain better. Nothing makes pain worse.

## 2014-01-31 NOTE — Progress Notes (Signed)
Discharge instructions given.  Patient left via wheelchair with family member. Kelly Castillo

## 2014-01-31 NOTE — Discharge Summary (Signed)
Physician Discharge Summary     Patient ID: Kelly Castillo MRN: [id number] DOB/AGE: 06-29-1934 80 y.o. Cardiologist:  Jens Som  Admit date: 01/31/2014 Discharge date: 01/31/2014  Admission Diagnoses: Chest Pain.  Discharge Diagnoses:  Active Problems:  1 chest pain- 2 history of moderate mitral regurgitation- 3 diabetes mellitus-  4 hypertension-  5 history of nonischemic cardiomyopathy-.  6 normocytic anemia-   Discharged Condition: stable  Hospital Course:   Evaluate chest pain; history of nonischemic cardiomyopathy. Catheterization performed in 2004 showed nonobstructive coronary disease and significant mitral regurgitation graded 3+ or more. Her ejection fraction was 41% at the time. A TEE at that time showed mitral regurgitation that was felt to be 2-3+. However, her most recent echocardiogram was performed in December 2013. Ejection fraction was 50-55%; no significant mitral regurgitation. Patient has had intermittent chest pain and cardiac catheterization was recommended in May of 2014. However she apparently canceled this because of lack of transportation. She has continued to have substernal chest pain. This occurs with exertion and is relieved with rest. It does not radiate. No associated symptoms. She has been under stress at home. She had chest pain yesterday described as substernal and sharp. Not pleuritic, positional or related to food. No radiation or associated symptoms. Symptoms lasted approximately 30 minutes to one hour and resolveed with aspirin. Presently pain-free.  The patient was admitted and had three negative troponins.  She was taken to the cath lab which revealed nonobstructive CAD and normal LV function(see cath lab note below).  The patient was seen by Dr. Swaziland who felt she was stable for DC home.  Follow up with be arranged.     Consults: None  Significant Diagnostic Studies:  LEft heart cath. Procedural Findings:  Hemodynamics:  AO 144/58 mean 86  mm Hg  LV 141/8 mm Hg  Coronary angiography:  Coronary dominance: right  Left mainstem: Normal  Left anterior descending (LAD): There is a long 30-40% stenosis in the proximal LAD  Left circumflex (LCx): There is 20% disease in the mid LCx.  Right coronary artery (RCA): Dominant. Normal.  Left ventriculography: Left ventricular systolic function is normal, LVEF is estimated at 55-65%, there is mild mitral regurgitation  Final Conclusions:  1. Nonobstructive CAD  2. Normal LV function.  Recommendations: medical management.  Peter Swaziland MD,FACC   Treatments:  See above.  Discharge Exam: Blood pressure 139/53, pulse 65, temperature 98.2 F (36.8 C), temperature source Oral, resp. rate 16, height 4\' 9"  (1.448 m), weight 99 lb 12.8 oz (45.269 kg), SpO2 99.00%.   Disposition: 01-Home or Self Care      Discharge Orders   Future Orders Complete By Expires   Diet - low sodium heart healthy  As directed    Discharge instructions  As directed    Comments:     No lifting with your right hand for three days.   Increase activity slowly  As directed        Medication List         alendronate 70 MG tablet  Commonly known as:  FOSAMAX  - Take 70 mg by mouth once a week. Take with a full glass of water on an empty stomach.  - On Mondays     aspirin EC 81 MG tablet  Take 1 tablet (81 mg total) by mouth daily.     atorvastatin 20 MG tablet  Commonly known as:  LIPITOR  Take 1 tablet (20 mg total) by mouth daily at 6 PM.  FERREX 150 150 MG capsule  Generic drug:  iron polysaccharides  Take 150 mg by mouth daily.     glimepiride 4 MG tablet  Commonly known as:  AMARYL  Take 4 mg by mouth 2 (two) times daily.     hydrochlorothiazide 12.5 MG capsule  Commonly known as:  MICROZIDE  Take 12.5 mg by mouth daily.     lisinopril 40 MG tablet  Commonly known as:  PRINIVIL,ZESTRIL  Take 40 mg by mouth daily.     metoprolol tartrate 25 MG tablet  Commonly known as:  LOPRESSOR   Take 0.5 tablets (12.5 mg total) by mouth 2 (two) times daily.       Follow-up Information   Follow up with Olga MillersBrian Crenshaw, MD. (The office will call you with the appt date and time.  )    Specialty:  Cardiology   Contact information:   [address] [place] [address] [phone]       Signed: Wilburt FinlayHAGER, Jacynda Brunke 01/31/2014, 5:41 PM

## 2014-01-31 NOTE — ED Provider Notes (Signed)
CSN: [id number]     Arrival date & time 01/31/14  0041 History   First MD Initiated Contact with Patient 01/31/14 0410     Chief Complaint  Patient presents with  . Chest Pain     (Consider location/radiation/quality/duration/timing/severity/associated sxs/prior Treatment) HPI Patient states she had an episode of central chest pain that did not radiate that she describes as dull. It started around midnight. Family notes the patient was stressed at this point. She took her evening medications including aspirin and her chest pain resolved shortly after. Now the patient is completely pain-free. She has no lower extremity swelling or pain. She has history of nonischemic cardiomyopathy and several years ago had a catheterization that showed no blockages. She has known left bundle branch block. She is followed by Dr. Jens Somrenshaw. Past Medical History  Diagnosis Date  . Non-ischemic cardiomyopathy   . Diabetes mellitus   . GERD (gastroesophageal reflux disease)   . LBBB (left bundle branch block)   . HTN (hypertension)   . Anemia   . Lumbar disc disease    Past Surgical History  Procedure Laterality Date  . Appendectomy  40 yrs ago    [place]  . Tubal ligation      [facility]  . Abdominal hysterectomy      [facility]  . Orif ankle fracture      right ankle-[facility]  . Cataract extraction w/phaco  07/24/2011    Procedure: CATARACT EXTRACTION PHACO AND INTRAOCULAR LENS PLACEMENT (IOC);  Surgeon: Gemma PayorKerry Hunt;  Location: [facility];  Service: Ophthalmology;  Laterality: Left;  CDE: 14.73   Family History  Problem Relation Age of Onset  . Anesthesia problems Neg Hx   . Hypotension Neg Hx   . Malignant hyperthermia Neg Hx   . Pseudochol deficiency Neg Hx    History  Substance Use Topics  . Smoking status: Never Smoker   . Smokeless tobacco: Not on file  . Alcohol Use: No   OB History   Grav Para Term Preterm Abortions TAB SAB Ect Mult Living                 Review of Systems  Constitutional:  Negative for fever and chills.  Respiratory: Negative for cough and shortness of breath.   Cardiovascular: Positive for chest pain. Negative for palpitations and leg swelling.  Gastrointestinal: Positive for nausea. Negative for vomiting, abdominal pain and diarrhea.  Musculoskeletal: Negative for back pain, myalgias, neck pain and neck stiffness.  Skin: Negative for rash and wound.  Neurological: Negative for dizziness, weakness, light-headedness, numbness and headaches.  All other systems reviewed and are negative.      Allergies  Review of patient's allergies indicates no known allergies.  Home Medications   Current Outpatient Rx  Name  Route  Sig  Dispense  Refill  . alendronate (FOSAMAX) 70 MG tablet   Oral   Take 70 mg by mouth once a week. Take with a full glass of water on an empty stomach. On Mondays         . aspirin EC 81 MG tablet   Oral   Take 1 tablet (81 mg total) by mouth daily.   90 tablet   3   . glimepiride (AMARYL) 4 MG tablet   Oral   Take 4 mg by mouth 2 (two) times daily.           . hydrochlorothiazide (MICROZIDE) 12.5 MG capsule   Oral   Take 12.5 mg by mouth daily.         .Marland Kitchen  iron polysaccharides (FERREX 150) 150 MG capsule   Oral   Take 150 mg by mouth daily.           Marland Kitchen lisinopril (PRINIVIL,ZESTRIL) 40 MG tablet   Oral   Take 40 mg by mouth daily.          BP 137/66  Pulse 79  Temp(Src) 97.8 F (36.6 C) (Oral)  Resp 21  SpO2 100% Physical Exam  Nursing note and vitals reviewed. Constitutional: She is oriented to person, place, and time. She appears well-developed and well-nourished. No distress.  HENT:  Head: Normocephalic and atraumatic.  Mouth/Throat: Oropharynx is clear and moist.  Eyes: EOM are normal. Pupils are equal, round, and reactive to light.  Neck: Normal range of motion. Neck supple.  Cardiovascular: Normal rate and regular rhythm.   Pulmonary/Chest: Effort normal and breath sounds normal. No respiratory  distress. She has no wheezes. She has no rales. She exhibits no tenderness.  Abdominal: Soft. Bowel sounds are normal. She exhibits no distension and no mass. There is no tenderness. There is no rebound and no guarding.  Musculoskeletal: Normal range of motion. She exhibits no edema and no tenderness.  No calf swelling or tenderness.  Neurological: She is alert and oriented to person, place, and time.  Moves all extremities without deficit. Sensation is grossly intact.  Skin: Skin is warm and dry. No rash noted. No erythema.  Psychiatric: She has a normal mood and affect. Her behavior is normal.    ED Course  Procedures (including critical care time) Labs Review Labs Reviewed  CBC - Abnormal; Notable for the following:    Hemoglobin 11.3 (*)    HCT 33.6 (*)    All other components within normal limits  BASIC METABOLIC PANEL - Abnormal; Notable for the following:    Sodium 136 (*)    Glucose, Bld 124 (*)    GFR calc non Af Amer 59 (*)    GFR calc Af Amer 68 (*)    All other components within normal limits  TROPONIN I  Rosezena Sensor, ED   Imaging Review Dg Chest 2 View  01/31/2014   CLINICAL DATA:  Mid chest pain.  EXAM: CHEST  2 VIEW  COMPARISON:  PA and lateral chest 03/23/2013.  FINDINGS: Lungs are clear. Heart size is normal. No pneumothorax or pleural effusion. No focal bony abnormality.  IMPRESSION: Negative chest.   Electronically Signed   By: Drusilla Kanner M.D.   On: 01/31/2014 01:32     EKG Interpretation   Date/Time:  Tuesday January 31 2014 00:46:00 EDT Ventricular Rate:  78 PR Interval:  154 QRS Duration: 138 QT Interval:  408 QTC Calculation: 465 R Axis:   64 Text Interpretation:  Normal sinus rhythm Left bundle branch block  Abnormal ECG Confirmed by Eban Weick  MD, Kiauna Zywicki ([id number]) on 01/31/2014  4:11:07 AM      MDM   Final diagnoses:  None    Initial troponin is normal. Will talk a delta troponin and discuss disposition with cardiology.  Second  troponin is normal. Discuss with Dr. Evie Lacks. This thinks the patient can either be worked up as an inpatient or outpatient. The pain family and patient are preferring admission. Cardiology will see in emergency department.  Loren Racer, MD 01/31/14 [id number]

## 2014-02-01 NOTE — Progress Notes (Signed)
Utilization review completed- retro 

## 2014-02-01 NOTE — Discharge Summary (Signed)
Patient seen and examined and history reviewed. Agree with above findings and plan. Please see cardiac cath report.  Theron Arista JordanMD 02/01/2014 7:01 AM

## 2014-02-13 ENCOUNTER — Encounter: Payer: Self-pay | Admitting: Physician Assistant

## 2014-02-13 ENCOUNTER — Ambulatory Visit (INDEPENDENT_AMBULATORY_CARE_PROVIDER_SITE_OTHER): Payer: Medicare HMO | Admitting: Physician Assistant

## 2014-02-13 VITALS — BP 160/70 | HR 75 | Ht <= 58 in | Wt 104.0 lb

## 2014-02-13 DIAGNOSIS — I428 Other cardiomyopathies: Secondary | ICD-10-CM

## 2014-02-13 DIAGNOSIS — I10 Essential (primary) hypertension: Secondary | ICD-10-CM

## 2014-02-13 DIAGNOSIS — I080 Rheumatic disorders of both mitral and aortic valves: Secondary | ICD-10-CM

## 2014-02-13 NOTE — Patient Instructions (Signed)
Your physician wants you to follow-up in: 6 MONTHS WITH DR. CRENSHAW. You will receive a reminder letter in the mail two months in advance. If you don't receive a letter, please call our office to schedule the follow-up appointment.   Your physician recommends that you continue on your current medications as directed. Please refer to the Current Medication list given to you today.   

## 2014-02-13 NOTE — Assessment & Plan Note (Signed)
Patient's blood pressure is elevated today but she has not taken any of her medications. Recommend 2 g sodium diet and to take her medicines as she gets home. Followup with primary M.D.

## 2014-02-13 NOTE — Progress Notes (Signed)
HPI:  This is a very pleasant 80 year old African American female patient of Dr. Jens Somrenshaw who is here for post hospital followup. Had she has a history of a nonischemic cardiomyopathy and was admitted with chest pain. She underwent cardiac catheterization on 01/31/14 that showed nonobstructive CAD and normal LV function. Last 2-D echo in 10/2012 showed EF of 50-55% with no significant mitral regurgitation.  The patient is doing well without any further chest pain. Her main complaint is allergies and stress from her grandchildren.  No Known Allergies  Current Outpatient Prescriptions on File Prior to Visit: aspirin EC 81 MG tablet, Take 1 tablet (81 mg total) by mouth daily., Disp: 90 tablet, Rfl: 3 atorvastatin (LIPITOR) 20 MG tablet, Take 1 tablet (20 mg total) by mouth daily at 6 PM., Disp: 30 tablet, Rfl: 5 glimepiride (AMARYL) 4 MG tablet, Take 4 mg by mouth 2 (two) times daily.  , Disp: , Rfl:  hydrochlorothiazide (MICROZIDE) 12.5 MG capsule, Take 12.5 mg by mouth daily., Disp: , Rfl:  iron polysaccharides (FERREX 150) 150 MG capsule, Take 150 mg by mouth daily.  , Disp: , Rfl:  lisinopril (PRINIVIL,ZESTRIL) 40 MG tablet, Take 40 mg by mouth daily., Disp: , Rfl:  metoprolol tartrate (LOPRESSOR) 25 MG tablet, Take 0.5 tablets (12.5 mg total) by mouth 2 (two) times daily., Disp: 60 tablet, Rfl: 5  No current facility-administered medications on file prior to visit.   Past Medical History:   Non-ischemic cardiomyopathy                                  Diabetes mellitus                                            GERD (gastroesophageal reflux disease)                       LBBB (left bundle branch block)                              HTN (hypertension)                                           Anemia                                                       Lumbar disc disease                                         Past Surgical History:   APPENDECTOMY                                     40  yrs ago     Comment:[place]   TUBAL LIGATION  Comment:mmh   ABDOMINAL HYSTERECTOMY                                          Comment:mmh   ORIF ANKLE FRACTURE                                             Comment:right ankle-mmh   CATARACT EXTRACTION W/PHACO                      07/24/2011       Comment:Procedure: CATARACT EXTRACTION PHACO AND               INTRAOCULAR LENS PLACEMENT (IOC);  Surgeon:               Gemma Payor;  Location: [facility];  Service:               Ophthalmology;  Laterality: Left;  CDE: 14.73  Review of patient's family history indicates:   Anesthesia problems            Neg Hx                   Hypotension                    Neg Hx                   Malignant hyperthermia         Neg Hx                   Pseudochol deficiency          Neg Hx                   Social History   Marital Status: Divorced            Spouse Name:                      Years of Education:                 Number of children: 10          Occupational History   None on file  Social History Main Topics   Smoking Status: Never Smoker                     Smokeless Status: Not on file                      Alcohol Use: No             Drug Use: No             Sexual Activity: Yes                    Birth Control/Protection: Surgical  Other Topics            Concern   None on file  Social History Narrative   None on file    ROS: See history of present illness otherwise negative   PHYSICAL EXAM: Thin, in no acute distress. Neck: No JVD, HJR, Bruit, or thyroid enlargement  Lungs: No tachypnea, clear without wheezing, rales, or  rhonchi  Cardiovascular: RRR, PMI not displaced, positive S4, 2/6 systolic murmur at the left sternal border, no bruit, thrill, or heave.  Abdomen: BS normal. Soft without organomegaly, masses, lesions or tenderness.  Extremities: Right arm and cath site without hematoma or hemorrhage good radial and  brachial pulses, otherwise lower extremities without cyanosis, clubbing or edema. Good distal pulses bilateral  SKin: Warm, no lesions or rashes   Musculoskeletal: No deformities  Neuro: no focal signs  BP 160/70  Pulse 75  Ht 4\' 8"  (1.422 m)  Wt 104 lb (47.174 kg)  BMI 23.33 kg/m2  SpO2 98%   2-D echo 2013 Study Conclusions  Left ventricle: The cavity size was normal. Systolic function was normal. The estimated ejection fraction was in the range of 50% to 55%. There was an increased relative contribution of atrial contraction to ventricular filling.            Transthoracic echocardiography.  M-mode, complete 2D, spectral Doppler, and color Doppler.  Height:  Height: 147.3cm. Height: 58in.  Weight:  Weight: 47.6kg. Weight: 104.8lb.  Body mass index:  BMI: 21.9kg/m^2.  Body surface area:    BSA: 1.62m^2.  Blood pressure:     180/80.  Patient status:  Outpatient.  Location:  [facility]  ------------------------------------------------------------  ------------------------------------------------------------ Left ventricle:  The cavity size was normal. Systolic function was normal. The estimated ejection fraction was in the range of 50% to 55%. There was an increased relative contribution of atrial contraction to ventricular filling   Significant Diagnostic Studies:   01/31/14 LEft heart cath. Procedural Findings:   Hemodynamics:   AO 144/58 mean 86 mm Hg   LV 141/8 mm Hg   Coronary angiography:   Coronary dominance: right   Left mainstem: Normal   Left anterior descending (LAD): There is a long 30-40% stenosis in the proximal LAD   Left circumflex (LCx): There is 20% disease in the mid LCx.   Right coronary artery (RCA): Dominant. Normal.   Left ventriculography: Left ventricular systolic function is normal, LVEF is estimated at 55-65%, there is mild mitral regurgitation   Final Conclusions:   1. Nonobstructive CAD   2. Normal LV function.    Recommendations: medical management.   Peter Swaziland MD,FACC

## 2014-02-13 NOTE — Assessment & Plan Note (Signed)
Patient still has a murmur but her last 2-D echo showed no significant MR in 2013. Asymptomatic

## 2014-02-13 NOTE — Assessment & Plan Note (Signed)
Patient's cardiomyopathy seems to have resolved with normal LV function. And nonobstructive CAD on cardiac cath.

## 2014-06-19 ENCOUNTER — Encounter: Payer: Self-pay | Admitting: Internal Medicine

## 2014-10-26 ENCOUNTER — Encounter (HOSPITAL_COMMUNITY): Payer: Self-pay | Admitting: Cardiology

## 2015-02-10 ENCOUNTER — Other Ambulatory Visit: Payer: Self-pay | Admitting: Physician Assistant

## 2015-07-11 ENCOUNTER — Encounter: Payer: Self-pay | Admitting: Cardiology

## 2015-07-11 ENCOUNTER — Encounter: Payer: Self-pay | Admitting: *Deleted

## 2015-07-11 ENCOUNTER — Ambulatory Visit (INDEPENDENT_AMBULATORY_CARE_PROVIDER_SITE_OTHER): Payer: Medicare HMO | Admitting: Cardiology

## 2015-07-11 VITALS — BP 154/50 | HR 74 | Ht <= 58 in | Wt 106.7 lb

## 2015-07-11 DIAGNOSIS — I059 Rheumatic mitral valve disease, unspecified: Secondary | ICD-10-CM | POA: Diagnosis not present

## 2015-07-11 DIAGNOSIS — R079 Chest pain, unspecified: Secondary | ICD-10-CM | POA: Diagnosis not present

## 2015-07-11 NOTE — Assessment & Plan Note (Signed)
Patient had minimal coronary disease on previous catheterization. Her symptoms are concerning but they were also present prior to her previous catheterization. Plan Lexiscan nuclear study for risk stratification. We will see her back in 8 weeks to make sure that her symptoms are stable and to review her study. Continue aspirin and statin for minimal coronary disease.

## 2015-07-11 NOTE — Assessment & Plan Note (Signed)
Blood pressure is mildly elevated. Have asked her to follow this at home. If her systolic is greater than 140 or diastolic greater than 90 we will add additional medications.

## 2015-07-11 NOTE — Assessment & Plan Note (Signed)
Plan repeat echocardiogram to reassess. 

## 2015-07-11 NOTE — Progress Notes (Signed)
HPI: FU nonischemic cardiomyopathy. A previous catheterization performed in 2004, showed nonobstructive coronary disease and significant mitral regurgitation and graded 3+ or more. Her ejection fraction was 41% at the time. A TEE at that time showed mitral regurgitation that was felt to be 2-3+. However, her most recent echocardiogram was performed in December 2013. Ejection fraction was 50-55%; no significant mitral regurgitation. Cardiac catheterization repeated March 2015. There was a 30-40% proximal LAD and 20% mid circumflex. Ejection fraction 55-65% with mild mitral regurgitation. Since last seen, she notes chest pain. It is substernal. It occurs with exertion and is relieved with rest. It is similar description to chest pain that she has had in the past prior to her previous catheterization. She states it increases with turning over in bed. She denies dyspnea.  Current Outpatient Prescriptions  Medication Sig Dispense Refill  . aspirin EC 81 MG tablet Take 1 tablet (81 mg total) by mouth daily. 90 tablet 3  . atorvastatin (LIPITOR) 20 MG tablet Take 1 tablet (20 mg total) by mouth daily at 6 PM. 30 tablet 5  . glimepiride (AMARYL) 4 MG tablet Take 4 mg by mouth 2 (two) times daily.      . hydrochlorothiazide (MICROZIDE) 12.5 MG capsule Take 12.5 mg by mouth daily.    . iron polysaccharides (FERREX 150) 150 MG capsule Take 150 mg by mouth daily.      Marland Kitchen lisinopril (PRINIVIL,ZESTRIL) 40 MG tablet Take 40 mg by mouth daily.    . metoprolol tartrate (LOPRESSOR) 25 MG tablet TAKE ONE HALF TABLET BY MOUTH TWICE DAILY 60 tablet 1  . metFORMIN (GLUCOPHAGE) 500 MG tablet Take 500 mg by mouth daily.     No current facility-administered medications for this visit.     Past Medical History  Diagnosis Date  . Non-ischemic cardiomyopathy   . Diabetes mellitus   . GERD (gastroesophageal reflux disease)   . LBBB (left bundle branch block)   . HTN (hypertension)   . Anemia   . Lumbar disc  disease     Past Surgical History  Procedure Laterality Date  . Appendectomy  40 yrs ago    [place]  . Tubal ligation      [facility]  . Abdominal hysterectomy      [facility]  . Orif ankle fracture      right ankle-mmh  . Cataract extraction w/phaco  07/24/2011    Procedure: CATARACT EXTRACTION PHACO AND INTRAOCULAR LENS PLACEMENT (IOC);  Surgeon: Gemma Payor;  Location: [facility];  Service: Ophthalmology;  Laterality: Left;  CDE: 14.73  . Left heart catheterization with coronary angiogram N/A 01/31/2014    Procedure: LEFT HEART CATHETERIZATION WITH CORONARY ANGIOGRAM;  Surgeon: Peter M Swaziland, MD;  Location: [facility] CATH LAB;  Service: Cardiovascular;  Laterality: N/A;    Social History   Social History  . Marital Status: Divorced    Spouse Name: N/A  . Number of Children: 10  . Years of Education: N/A   Occupational History  . Not on file.   Social History Main Topics  . Smoking status: Never Smoker   . Smokeless tobacco: Not on file  . Alcohol Use: No  . Drug Use: No  . Sexual Activity: Yes    Birth Control/ Protection: Surgical   Other Topics Concern  . Not on file   Social History Narrative    ROS: no fevers or chills, productive cough, hemoptysis, dysphasia, odynophagia, melena, hematochezia, dysuria, hematuria, rash, seizure activity, orthopnea, PND, pedal edema,  claudication. Remaining systems are negative.  Physical Exam: Well-developed well-nourished in no acute distress.  Skin is warm and dry.  HEENT is normal.  Neck is supple.  Chest is clear to auscultation with normal expansion.  Cardiovascular exam is regular rate and rhythm.  Abdominal exam nontender or distended. No masses palpated. Extremities show no edema. neuro grossly intact  ECG sinus rhythm, left bundle branch block.

## 2015-07-11 NOTE — Assessment & Plan Note (Signed)
Improved on most recent echocardiogram. Plan repeat study.

## 2015-07-11 NOTE — Patient Instructions (Signed)
Your physician recommends that you schedule a follow-up appointment in: 8 WEEKS WITH DR Jens Som  Your physician has requested that you have a lexiscan myoview. For further information please visit [email or web address] Please follow instruction sheet, as given.   Your physician has requested that you have an echocardiogram. Echocardiography is a painless test that uses sound waves to create images of your heart. It provides your doctor with information about the size and shape of your heart and how well your heart's chambers and valves are working. This procedure takes approximately one hour. There are no restrictions for this procedure.

## 2015-07-19 ENCOUNTER — Telehealth (HOSPITAL_COMMUNITY): Payer: Self-pay | Admitting: *Deleted

## 2015-07-19 NOTE — Telephone Encounter (Signed)
Left message on voicemail in reference to upcoming appointment scheduled for 07/25/15. Phone number given for a call back so details instructions can be given. Komal Stangelo W   

## 2015-07-20 ENCOUNTER — Telehealth (HOSPITAL_COMMUNITY): Payer: Self-pay

## 2015-07-20 NOTE — Telephone Encounter (Signed)
Left message on voicemail in reference to upcoming appointment scheduled for 07-25-2015. Phone number given for a call back so details instructions can be given. Randa Evens, Abayomi Pattison A Spoke with patient's daughter at [phone] and she will have the patient to call us back. Irean Hong, RN

## 2015-07-24 ENCOUNTER — Telehealth (HOSPITAL_COMMUNITY): Payer: Self-pay | Admitting: *Deleted

## 2015-07-24 NOTE — Telephone Encounter (Signed)
Patient gave verbal ok over phone to speak to grand daughter,she was given detailed instructions per Myocardial Perfusion Study Information Sheet for test on 07/25/15 at 1130. Patient notified to arrive 15 minutes early and that it is imperative to arrive on time for appointment to keep from having the test rescheduled.  If you need to cancel or reschedule your appointment, please call the office within 24 hours of your appointment. Failure to do so may result in a cancellation of your appointment, and a $50 no show fee. Patient verbalized understanding. Revere Maahs, Adelene Idler

## 2015-07-25 ENCOUNTER — Ambulatory Visit (HOSPITAL_BASED_OUTPATIENT_CLINIC_OR_DEPARTMENT_OTHER): Payer: Medicare HMO

## 2015-07-25 ENCOUNTER — Other Ambulatory Visit: Payer: Self-pay

## 2015-07-25 ENCOUNTER — Ambulatory Visit (HOSPITAL_COMMUNITY): Payer: Medicare HMO | Attending: Cardiology

## 2015-07-25 DIAGNOSIS — I059 Rheumatic mitral valve disease, unspecified: Secondary | ICD-10-CM | POA: Diagnosis not present

## 2015-07-25 DIAGNOSIS — R079 Chest pain, unspecified: Secondary | ICD-10-CM | POA: Diagnosis present

## 2015-07-25 DIAGNOSIS — R0609 Other forms of dyspnea: Secondary | ICD-10-CM | POA: Diagnosis not present

## 2015-07-25 DIAGNOSIS — I428 Other cardiomyopathies: Secondary | ICD-10-CM | POA: Diagnosis not present

## 2015-07-25 DIAGNOSIS — I10 Essential (primary) hypertension: Secondary | ICD-10-CM | POA: Insufficient documentation

## 2015-07-25 DIAGNOSIS — E119 Type 2 diabetes mellitus without complications: Secondary | ICD-10-CM | POA: Insufficient documentation

## 2015-07-25 DIAGNOSIS — R9439 Abnormal result of other cardiovascular function study: Secondary | ICD-10-CM | POA: Diagnosis not present

## 2015-07-25 DIAGNOSIS — I447 Left bundle-branch block, unspecified: Secondary | ICD-10-CM | POA: Diagnosis not present

## 2015-07-25 DIAGNOSIS — I340 Nonrheumatic mitral (valve) insufficiency: Secondary | ICD-10-CM | POA: Diagnosis not present

## 2015-07-25 DIAGNOSIS — R110 Nausea: Secondary | ICD-10-CM

## 2015-07-25 LAB — MYOCARDIAL PERFUSION IMAGING
LV dias vol: 75 mL
LV sys vol: 35 mL
Peak HR: 115 {beats}/min
RATE: 0.28
Rest HR: 71 {beats}/min
SDS: 5
SRS: 6
SSS: 11
TID: 1.15

## 2015-07-25 MED ORDER — AMINOPHYLLINE 25 MG/ML IV SOLN
75.0000 mg | Freq: Once | INTRAVENOUS | Status: AC
  Administered 2015-07-25: 75 mg via INTRAVENOUS

## 2015-07-25 MED ORDER — REGADENOSON 0.4 MG/5ML IV SOLN
0.4000 mg | Freq: Once | INTRAVENOUS | Status: AC
  Administered 2015-07-25: 0.4 mg via INTRAVENOUS

## 2015-07-25 MED ORDER — TECHNETIUM TC 99M SESTAMIBI GENERIC - CARDIOLITE
10.4000 | Freq: Once | INTRAVENOUS | Status: AC | PRN
  Administered 2015-07-25: 10 via INTRAVENOUS

## 2015-07-25 MED ORDER — TECHNETIUM TC 99M SESTAMIBI GENERIC - CARDIOLITE
30.8000 | Freq: Once | INTRAVENOUS | Status: AC | PRN
  Administered 2015-07-25: 30.8 via INTRAVENOUS

## 2015-08-06 ENCOUNTER — Encounter: Payer: Self-pay | Admitting: Cardiology

## 2015-08-06 ENCOUNTER — Ambulatory Visit (INDEPENDENT_AMBULATORY_CARE_PROVIDER_SITE_OTHER): Payer: Medicare HMO | Admitting: Cardiology

## 2015-08-06 ENCOUNTER — Other Ambulatory Visit: Payer: Self-pay

## 2015-08-06 ENCOUNTER — Other Ambulatory Visit: Payer: Self-pay | Admitting: *Deleted

## 2015-08-06 VITALS — BP 164/70 | HR 76 | Ht 60.0 in | Wt 102.9 lb

## 2015-08-06 DIAGNOSIS — I10 Essential (primary) hypertension: Secondary | ICD-10-CM

## 2015-08-06 DIAGNOSIS — R9439 Abnormal result of other cardiovascular function study: Secondary | ICD-10-CM

## 2015-08-06 DIAGNOSIS — R931 Abnormal findings on diagnostic imaging of heart and coronary circulation: Secondary | ICD-10-CM | POA: Diagnosis not present

## 2015-08-06 DIAGNOSIS — I080 Rheumatic disorders of both mitral and aortic valves: Secondary | ICD-10-CM | POA: Diagnosis not present

## 2015-08-06 MED ORDER — NITROGLYCERIN 0.4 MG SL SUBL: 0.4000 mg | SUBLINGUAL_TABLET | SUBLINGUAL | Status: DC | PRN

## 2015-08-06 MED ORDER — METOPROLOL TARTRATE 25 MG PO TABS: 25.0000 mg | ORAL_TABLET | Freq: Two times a day (BID) | ORAL | Status: DC

## 2015-08-06 NOTE — Patient Instructions (Signed)
Take NTG under tongue every 5 min up to 3 tablets for chest pain after 3 tablets and 15 mins if still having chest pain call 911.   Cardiac Cath scheduled this Wednesday 08/08/15 arrive at [facility] at 8:00 am  Follow instructions given   Lab work today  [place] 1st floor  ( bmet,cbc,pt )   Chest Xray today before 5:00 pm or tomorrow 08/07/15 at [facility]

## 2015-08-06 NOTE — Progress Notes (Signed)
08/06/2015 Kelly Castillo   [birth year]  [id number]  Primary Physician Kirstie Peri, MD Primary Cardiologist: Dr. Jens Som   Reason for Visit/CC: Chest pain/ Abnormal NST  HPI:   The patient is a 81 year old female, followed by Dr. Jens Som. Her history is notable for nonischemic cardiomyopathy. Previous catheterization performed in 2004 showed nonobstructive coronary disease and significant mitral regurgitation with grade 3+ or more. Her ejection fraction was 41% at that time. A TEE at that time showed mitral regurgitation that was felt to be 2-3+. However, 2D echocardiogram performed in December 2013 showed an ejection fraction of 50-55%; no significant mitral regurgitation. Cardiac catheterization was repeated March 2015. There was a 30-40% proximal LAD and 20% mid circumflex. Ejection fraction 55-65% with mild mitral regurgitation.    She was recently seen by Dr. Jens Som 07/11/2015.  At that time, she noted symptoms that were concerning for angina.  As a result, Dr. Jens Som ordered for her to undergo a Lexiscan nuclear study for risk stratification.  This was performed 07/26/2015. There is a medium defect of moderate severity present in the apical anterior, apical septum and apex location. Findings were consistent with ischemia. EF was 53% by stress test. This was interpreted as an intermediate risk study.  She also had a 2D echo which showed light reduction in EF to 45-50% and with akinesis of the basal-midanteroseptal myocardium. When compared to prior echocardiogram, anteroseptal wall motion abnormality is currently more pronounced.  The test results were reported to Dr. Jens Som and he advised that the patient follow-up in clinic to discuss plans for left heart catheterization to redefine her coronary anatomy.   She presents to clinic today with her daughter and 2 sons. She reports that she continues to have intermittent chest discomfort with moderate activity. She denies any resting  symptoms. She reports full medication compliance and full compliance with aspirin. She does not have SL nitroglycerin at home. She is currently CP free.     Current Outpatient Prescriptions  Medication Sig Dispense Refill  . aspirin EC 81 MG tablet Take 1 tablet (81 mg total) by mouth daily. 90 tablet 3  . atorvastatin (LIPITOR) 20 MG tablet Take 1 tablet (20 mg total) by mouth daily at 6 PM. 30 tablet 5  . glimepiride (AMARYL) 4 MG tablet Take 4 mg by mouth 2 (two) times daily.      . hydrochlorothiazide (MICROZIDE) 12.5 MG capsule Take 12.5 mg by mouth daily.    . iron polysaccharides (FERREX 150) 150 MG capsule Take 150 mg by mouth daily.      Marland Kitchen lisinopril (PRINIVIL,ZESTRIL) 40 MG tablet Take 40 mg by mouth daily.    . metFORMIN (GLUCOPHAGE) 500 MG tablet Take 500 mg by mouth daily.    . metoprolol tartrate (LOPRESSOR) 25 MG tablet TAKE ONE HALF TABLET BY MOUTH TWICE DAILY 60 tablet 1  . nitroGLYCERIN (NITROSTAT) 0.4 MG SL tablet Place 1 tablet (0.4 mg total) under the tongue every 5 (five) minutes as needed for chest pain. 25 tablet 6   No current facility-administered medications for this visit.    No Known Allergies  Social History   Social History  . Marital Status: Divorced    Spouse Name: N/A  . Number of Children: 10  . Years of Education: N/A   Occupational History  . Not on file.   Social History Main Topics  . Smoking status: Never Smoker   . Smokeless tobacco: Not on file  . Alcohol Use: No  . Drug Use:  No  . Sexual Activity: Yes    Birth Control/ Protection: Surgical   Other Topics Concern  . Not on file   Social History Narrative     Review of Systems: General: negative for chills, fever, night sweats or weight changes.  Cardiovascular: negative for chest pain, dyspnea on exertion, edema, orthopnea, palpitations, paroxysmal nocturnal dyspnea or shortness of breath Dermatological: negative for rash Respiratory: negative for cough or wheezing Urologic:  negative for hematuria Abdominal: negative for nausea, vomiting, diarrhea, bright red blood per rectum, melena, or hematemesis Neurologic: negative for visual changes, syncope, or dizziness All other systems reviewed and are otherwise negative except as noted above.    Blood pressure 164/70, pulse 76, height 5' (1.524 m), weight 102 lb 14.4 oz (46.675 kg).  General appearance: alert, cooperative and no distress Neck: no carotid bruit and no JVD Lungs: clear to auscultation bilaterally Heart: regular rate and rhythm, S1, S2 normal, no murmur, click, rub or gallop Extremities: no LEE Pulses: 2+ and symmetric Skin: warm and dry Neurologic: Grossly normal  EKG not preformed  ASSESSMENT AND PLAN:   1. Unstable Angina/ Abnormal NST: study 07/26/15 showed a medium defect of moderate severity present in the apical anterior, apical septum and apex location. Findings were consistent with ischemia. Also 2D echo is abnormal with reduced LVF to 45-50% and more pronounced WMA. She will require a LHC to further evaluate. We discussed procedural details including potential risk. She agrees to proceed. Will arrange for elective OP cath. Historically, her renal function has been normal, however he last BMP was 6 months ago. Will recheck a BMP today to ensure that she will not require precath hydration. I also prescribed SL NTG and reviewed the proper use and when to seek emergency medical care. Continue ASA, BB and statin. Patient was instructed to hold her Metformin and HCTZ day of procedure. Her BP is moderately elevated and her HR is in the mid 70s. Will increase her metoprolol to 25 mg BID.   2. HTN: Her BP is moderately elevated and her HR is in the mid 70s. Given her concerns for coronary ischemia, will increase her metoprolol to 25 mg BID.      PLAN  Will scheduled LHC at [facility] first available.   SIMMONS, BRITTAINY PA-C 08/06/2015 5:12 PM   

## 2015-08-07 ENCOUNTER — Ambulatory Visit (HOSPITAL_COMMUNITY)
Admission: RE | Admit: 2015-08-07 | Discharge: 2015-08-07 | Disposition: A | Discharge status: Home / Self Care | Payer: Medicare HMO | Source: Ambulatory Visit | Attending: Cardiology | Admitting: Cardiology

## 2015-08-07 DIAGNOSIS — I080 Rheumatic disorders of both mitral and aortic valves: Secondary | ICD-10-CM

## 2015-08-07 DIAGNOSIS — R9439 Abnormal result of other cardiovascular function study: Secondary | ICD-10-CM

## 2015-08-07 DIAGNOSIS — I10 Essential (primary) hypertension: Secondary | ICD-10-CM

## 2015-08-07 DIAGNOSIS — I340 Nonrheumatic mitral (valve) insufficiency: Secondary | ICD-10-CM

## 2015-08-07 LAB — CBC WITH DIFFERENTIAL/PLATELET
Basophils Absolute: 0 10*3/uL (ref 0.0–0.1)
Basophils Relative: 1 % (ref 0–1)
Eosinophils Absolute: 0.3 10*3/uL (ref 0.0–0.7)
Eosinophils Relative: 7 % — ABNORMAL HIGH (ref 0–5)
HCT: 32.4 % — ABNORMAL LOW (ref 36.0–46.0)
Hemoglobin: 10.4 g/dL — ABNORMAL LOW (ref 12.0–15.0)
Lymphocytes Relative: 24 % (ref 12–46)
Lymphs Abs: 1.2 10*3/uL (ref 0.7–4.0)
MCH: 25.8 pg — ABNORMAL LOW (ref 26.0–34.0)
MCHC: 32.1 g/dL (ref 30.0–36.0)
MCV: 80.4 fL (ref 78.0–100.0)
Monocytes Absolute: 0.3 10*3/uL (ref 0.1–1.0)
Monocytes Relative: 6 % (ref 3–12)
Neutro Abs: 3 10*3/uL (ref 1.7–7.7)
Neutrophils Relative %: 62 % (ref 43–77)
Platelets: 257 10*3/uL (ref 150–400)
RBC: 4.03 MIL/uL (ref 3.87–5.11)
RDW: 15.2 % (ref 11.5–15.5)
WBC: 4.9 10*3/uL (ref 4.0–10.5)

## 2015-08-07 LAB — BASIC METABOLIC PANEL
BUN: 21 mg/dL (ref 7–25)
CO2: 29 mmol/L (ref 20–31)
Calcium: 9.3 mg/dL (ref 8.6–10.4)
Chloride: 104 mmol/L (ref 98–110)
Creat: 0.76 mg/dL (ref 0.60–0.93)
Glucose, Bld: 227 mg/dL — ABNORMAL HIGH (ref 65–99)
Potassium: 4.6 mmol/L (ref 3.5–5.3)
Sodium: 138 mmol/L (ref 135–146)

## 2015-08-07 NOTE — Interval H&P Note (Signed)
Cath Lab Visit (complete for each Cath Lab visit)  Clinical Evaluation Leading to the Procedure:   ACS: No.  Non-ACS:    Anginal Classification: CCS III  Anti-ischemic medical therapy: Maximal Therapy (2 or more classes of medications)  Non-Invasive Test Results: Intermediate-risk stress test findings: cardiac mortality 1-3%/year  Prior CABG: No previous CABG      History and Physical Interval Note:  08/07/2015 6:05 PM  Kelly Castillo  has presented today for surgery, with the diagnosis of abnormal mioview/cp  The various methods of treatment have been discussed with the patient and family. After consideration of risks, benefits and other options for treatment, the patient has consented to  Procedure(s): Left Heart Cath and Coronary Angiography (N/A) as a surgical intervention .  The patient's history has been reviewed, patient examined, no change in status, stable for surgery.  I have reviewed the patient's chart and labs.  Questions were answered to the patient's satisfaction.     Lesleigh Noe

## 2015-08-07 NOTE — H&P (View-Only) (Signed)
08/06/2015 Kelly Castillo   [birth year]  [id number]  Primary Physician Kirstie Peri, MD Primary Cardiologist: Dr. Jens Som   Reason for Visit/CC: Chest pain/ Abnormal NST  HPI:   The patient is a 81 year old female, followed by Dr. Jens Som. Her history is notable for nonischemic cardiomyopathy. Previous catheterization performed in 2004 showed nonobstructive coronary disease and significant mitral regurgitation with grade 3+ or more. Her ejection fraction was 41% at that time. A TEE at that time showed mitral regurgitation that was felt to be 2-3+. However, 2D echocardiogram performed in December 2013 showed an ejection fraction of 50-55%; no significant mitral regurgitation. Cardiac catheterization was repeated March 2015. There was a 30-40% proximal LAD and 20% mid circumflex. Ejection fraction 55-65% with mild mitral regurgitation.    She was recently seen by Dr. Jens Som 07/11/2015.  At that time, she noted symptoms that were concerning for angina.  As a result, Dr. Jens Som ordered for her to undergo a Lexiscan nuclear study for risk stratification.  This was performed 07/26/2015. There is a medium defect of moderate severity present in the apical anterior, apical septum and apex location. Findings were consistent with ischemia. EF was 53% by stress test. This was interpreted as an intermediate risk study.  She also had a 2D echo which showed light reduction in EF to 45-50% and with akinesis of the basal-midanteroseptal myocardium. When compared to prior echocardiogram, anteroseptal wall motion abnormality is currently more pronounced.  The test results were reported to Dr. Jens Som and he advised that the patient follow-up in clinic to discuss plans for left heart catheterization to redefine her coronary anatomy.   She presents to clinic today with her daughter and 2 sons. She reports that she continues to have intermittent chest discomfort with moderate activity. She denies any resting  symptoms. She reports full medication compliance and full compliance with aspirin. She does not have SL nitroglycerin at home. She is currently CP free.     Current Outpatient Prescriptions  Medication Sig Dispense Refill  . aspirin EC 81 MG tablet Take 1 tablet (81 mg total) by mouth daily. 90 tablet 3  . atorvastatin (LIPITOR) 20 MG tablet Take 1 tablet (20 mg total) by mouth daily at 6 PM. 30 tablet 5  . glimepiride (AMARYL) 4 MG tablet Take 4 mg by mouth 2 (two) times daily.      . hydrochlorothiazide (MICROZIDE) 12.5 MG capsule Take 12.5 mg by mouth daily.    . iron polysaccharides (FERREX 150) 150 MG capsule Take 150 mg by mouth daily.      Marland Kitchen lisinopril (PRINIVIL,ZESTRIL) 40 MG tablet Take 40 mg by mouth daily.    . metFORMIN (GLUCOPHAGE) 500 MG tablet Take 500 mg by mouth daily.    . metoprolol tartrate (LOPRESSOR) 25 MG tablet TAKE ONE HALF TABLET BY MOUTH TWICE DAILY 60 tablet 1  . nitroGLYCERIN (NITROSTAT) 0.4 MG SL tablet Place 1 tablet (0.4 mg total) under the tongue every 5 (five) minutes as needed for chest pain. 25 tablet 6   No current facility-administered medications for this visit.    No Known Allergies  Social History   Social History  . Marital Status: Divorced    Spouse Name: N/A  . Number of Children: 10  . Years of Education: N/A   Occupational History  . Not on file.   Social History Main Topics  . Smoking status: Never Smoker   . Smokeless tobacco: Not on file  . Alcohol Use: No  . Drug Use:  No  . Sexual Activity: Yes    Birth Control/ Protection: Surgical   Other Topics Concern  . Not on file   Social History Narrative     Review of Systems: General: negative for chills, fever, night sweats or weight changes.  Cardiovascular: negative for chest pain, dyspnea on exertion, edema, orthopnea, palpitations, paroxysmal nocturnal dyspnea or shortness of breath Dermatological: negative for rash Respiratory: negative for cough or wheezing Urologic:  negative for hematuria Abdominal: negative for nausea, vomiting, diarrhea, bright red blood per rectum, melena, or hematemesis Neurologic: negative for visual changes, syncope, or dizziness All other systems reviewed and are otherwise negative except as noted above.    Blood pressure 164/70, pulse 76, height 5' (1.524 m), weight 102 lb 14.4 oz (46.675 kg).  General appearance: alert, cooperative and no distress Neck: no carotid bruit and no JVD Lungs: clear to auscultation bilaterally Heart: regular rate and rhythm, S1, S2 normal, no murmur, click, rub or gallop Extremities: no LEE Pulses: 2+ and symmetric Skin: warm and dry Neurologic: Grossly normal  EKG not preformed  ASSESSMENT AND PLAN:   1. Unstable Angina/ Abnormal NST: study 07/26/15 showed a medium defect of moderate severity present in the apical anterior, apical septum and apex location. Findings were consistent with ischemia. Also 2D echo is abnormal with reduced LVF to 45-50% and more pronounced WMA. She will require a LHC to further evaluate. We discussed procedural details including potential risk. She agrees to proceed. Will arrange for elective OP cath. Historically, her renal function has been normal, however he last BMP was 6 months ago. Will recheck a BMP today to ensure that she will not require precath hydration. I also prescribed SL NTG and reviewed the proper use and when to seek emergency medical care. Continue ASA, BB and statin. Patient was instructed to hold her Metformin and HCTZ day of procedure. Her BP is moderately elevated and her HR is in the mid 70s. Will increase her metoprolol to 25 mg BID.   2. HTN: Her BP is moderately elevated and her HR is in the mid 70s. Given her concerns for coronary ischemia, will increase her metoprolol to 25 mg BID.      PLAN  Will scheduled LHC at [facility] first available.   Robbie Lis PA-C 08/06/2015 5:12 PM

## 2015-08-08 ENCOUNTER — Other Ambulatory Visit: Payer: Self-pay | Admitting: *Deleted

## 2015-08-08 ENCOUNTER — Encounter (HOSPITAL_COMMUNITY)
Admission: AD | Disposition: A | Discharge status: Home / Self Care | Payer: Self-pay | Source: Ambulatory Visit | Attending: Thoracic Surgery (Cardiothoracic Vascular Surgery)

## 2015-08-08 ENCOUNTER — Encounter (HOSPITAL_COMMUNITY): Payer: Self-pay | Admitting: Interventional Cardiology

## 2015-08-08 ENCOUNTER — Inpatient Hospital Stay (HOSPITAL_COMMUNITY)
Admission: AD | Admit: 2015-08-08 | Discharge: 2015-08-14 | DRG: 234 | Disposition: A | Discharge status: Home / Self Care | Payer: Medicare HMO | Source: Ambulatory Visit | Attending: Thoracic Surgery (Cardiothoracic Vascular Surgery) | Admitting: Thoracic Surgery (Cardiothoracic Vascular Surgery)

## 2015-08-08 DIAGNOSIS — I447 Left bundle-branch block, unspecified: Secondary | ICD-10-CM | POA: Diagnosis present

## 2015-08-08 DIAGNOSIS — Z951 Presence of aortocoronary bypass graft: Secondary | ICD-10-CM

## 2015-08-08 DIAGNOSIS — I200 Unstable angina: Secondary | ICD-10-CM | POA: Diagnosis present

## 2015-08-08 DIAGNOSIS — R079 Chest pain, unspecified: Secondary | ICD-10-CM

## 2015-08-08 DIAGNOSIS — I080 Rheumatic disorders of both mitral and aortic valves: Secondary | ICD-10-CM | POA: Diagnosis not present

## 2015-08-08 DIAGNOSIS — K219 Gastro-esophageal reflux disease without esophagitis: Secondary | ICD-10-CM | POA: Diagnosis present

## 2015-08-08 DIAGNOSIS — I429 Cardiomyopathy, unspecified: Secondary | ICD-10-CM | POA: Diagnosis present

## 2015-08-08 DIAGNOSIS — I340 Nonrheumatic mitral (valve) insufficiency: Secondary | ICD-10-CM | POA: Diagnosis present

## 2015-08-08 DIAGNOSIS — E780 Pure hypercholesterolemia: Secondary | ICD-10-CM | POA: Diagnosis present

## 2015-08-08 DIAGNOSIS — D62 Acute posthemorrhagic anemia: Secondary | ICD-10-CM | POA: Diagnosis not present

## 2015-08-08 DIAGNOSIS — I152 Hypertension secondary to endocrine disorders: Secondary | ICD-10-CM | POA: Diagnosis present

## 2015-08-08 DIAGNOSIS — I25110 Atherosclerotic heart disease of native coronary artery with unstable angina pectoris: Principal | ICD-10-CM

## 2015-08-08 DIAGNOSIS — R931 Abnormal findings on diagnostic imaging of heart and coronary circulation: Secondary | ICD-10-CM | POA: Diagnosis not present

## 2015-08-08 DIAGNOSIS — I10 Essential (primary) hypertension: Secondary | ICD-10-CM | POA: Diagnosis present

## 2015-08-08 DIAGNOSIS — I2510 Atherosclerotic heart disease of native coronary artery without angina pectoris: Secondary | ICD-10-CM

## 2015-08-08 DIAGNOSIS — E119 Type 2 diabetes mellitus without complications: Secondary | ICD-10-CM | POA: Diagnosis present

## 2015-08-08 DIAGNOSIS — R9439 Abnormal result of other cardiovascular function study: Secondary | ICD-10-CM | POA: Insufficient documentation

## 2015-08-08 DIAGNOSIS — Z01810 Encounter for preprocedural cardiovascular examination: Secondary | ICD-10-CM | POA: Diagnosis not present

## 2015-08-08 DIAGNOSIS — R0789 Other chest pain: Secondary | ICD-10-CM | POA: Diagnosis present

## 2015-08-08 DIAGNOSIS — Z7982 Long term (current) use of aspirin: Secondary | ICD-10-CM | POA: Diagnosis not present

## 2015-08-08 DIAGNOSIS — Z79899 Other long term (current) drug therapy: Secondary | ICD-10-CM | POA: Diagnosis not present

## 2015-08-08 DIAGNOSIS — R0602 Shortness of breath: Secondary | ICD-10-CM

## 2015-08-08 HISTORY — DX: Other amnesia: R41.3

## 2015-08-08 HISTORY — DX: Pure hypercholesterolemia, unspecified: E78.00

## 2015-08-08 HISTORY — PX: CARDIAC CATHETERIZATION: SHX172

## 2015-08-08 HISTORY — DX: Type 2 diabetes mellitus without complications: E11.9

## 2015-08-08 LAB — PROTIME-INR
INR: 1.02 (ref <1.50)
Prothrombin Time: 13.5 seconds (ref 11.6–15.2)

## 2015-08-08 LAB — GLUCOSE, CAPILLARY
Glucose-Capillary: 172 mg/dL — ABNORMAL HIGH (ref 65–99)
Glucose-Capillary: 141 mg/dL — ABNORMAL HIGH (ref 65–99)
Glucose-Capillary: 155 mg/dL — ABNORMAL HIGH (ref 65–99)
Glucose-Capillary: 83 mg/dL (ref 65–99)

## 2015-08-08 SURGERY — LEFT HEART CATH AND CORONARY ANGIOGRAPHY

## 2015-08-08 MED ORDER — HYDRALAZINE HCL 20 MG/ML IJ SOLN
INTRAMUSCULAR | Status: DC | PRN
  Administered 2015-08-08: 10 mg via INTRAVENOUS

## 2015-08-08 MED ORDER — FENTANYL CITRATE (PF) 100 MCG/2ML IJ SOLN
INTRAMUSCULAR | Status: AC
  Filled 2015-08-08: qty 4

## 2015-08-08 MED ORDER — YOU HAVE A PACEMAKER BOOK
Freq: Once | Status: DC
  Filled 2015-08-08: qty 1

## 2015-08-08 MED ORDER — HEPARIN SODIUM (PORCINE) 1000 UNIT/ML IJ SOLN
INTRAMUSCULAR | Status: AC
  Filled 2015-08-08: qty 1

## 2015-08-08 MED ORDER — ONDANSETRON HCL 4 MG/2ML IJ SOLN: 4.0000 mg | Freq: Four times a day (QID) | INTRAMUSCULAR | Status: DC | PRN

## 2015-08-08 MED ORDER — ACETAMINOPHEN 325 MG PO TABS: 650.0000 mg | ORAL_TABLET | ORAL | Status: DC | PRN

## 2015-08-08 MED ORDER — ASPIRIN 81 MG PO CHEW: 81.0000 mg | CHEWABLE_TABLET | Freq: Every day | ORAL | Status: DC

## 2015-08-08 MED ORDER — VERAPAMIL HCL 2.5 MG/ML IV SOLN
INTRAVENOUS | Status: AC
  Filled 2015-08-08: qty 2

## 2015-08-08 MED ORDER — DIPHENHYDRAMINE HCL 25 MG PO CAPS
25.0000 mg | ORAL_CAPSULE | Freq: Four times a day (QID) | ORAL | Status: DC | PRN
  Administered 2015-08-08 – 2015-08-09 (×3): 25 mg via ORAL
  Filled 2015-08-08 (×3): qty 1

## 2015-08-08 MED ORDER — GLIMEPIRIDE 4 MG PO TABS
4.0000 mg | ORAL_TABLET | Freq: Two times a day (BID) | ORAL | Status: DC
  Administered 2015-08-08 – 2015-08-09 (×3): 4 mg via ORAL
  Filled 2015-08-08 (×6): qty 1

## 2015-08-08 MED ORDER — IOHEXOL 350 MG/ML SOLN
INTRAVENOUS | Status: DC | PRN
  Administered 2015-08-08: 85 mL via INTRAVENOUS

## 2015-08-08 MED ORDER — POLYSACCHARIDE IRON COMPLEX 150 MG PO CAPS
150.0000 mg | ORAL_CAPSULE | Freq: Every day | ORAL | Status: DC
  Administered 2015-08-09: 150 mg via ORAL
  Filled 2015-08-08 (×2): qty 1

## 2015-08-08 MED ORDER — LIDOCAINE HCL (PF) 1 % IJ SOLN
INTRAMUSCULAR | Status: AC
  Filled 2015-08-08: qty 30

## 2015-08-08 MED ORDER — SODIUM CHLORIDE 0.9 % IV SOLN: 250.0000 mL | INTRAVENOUS | Status: DC | PRN

## 2015-08-08 MED ORDER — SODIUM CHLORIDE 0.9 % IJ SOLN
3.0000 mL | Freq: Two times a day (BID) | INTRAMUSCULAR | Status: DC
  Administered 2015-08-08 – 2015-08-09 (×3): 3 mL via INTRAVENOUS

## 2015-08-08 MED ORDER — NITROGLYCERIN 0.4 MG SL SUBL: 0.4000 mg | SUBLINGUAL_TABLET | SUBLINGUAL | Status: DC | PRN

## 2015-08-08 MED ORDER — LISINOPRIL 40 MG PO TABS
40.0000 mg | ORAL_TABLET | Freq: Every day | ORAL | Status: DC
  Administered 2015-08-09: 09:00:00 40 mg via ORAL
  Filled 2015-08-08: qty 1

## 2015-08-08 MED ORDER — NITROGLYCERIN 1 MG/10 ML FOR IR/CATH LAB
INTRA_ARTERIAL | Status: AC
  Filled 2015-08-08: qty 10

## 2015-08-08 MED ORDER — ASPIRIN EC 81 MG PO TBEC
81.0000 mg | DELAYED_RELEASE_TABLET | Freq: Every day | ORAL | Status: DC
  Administered 2015-08-09: 09:00:00 81 mg via ORAL
  Filled 2015-08-08: qty 1

## 2015-08-08 MED ORDER — NITROGLYCERIN 1 MG/10 ML FOR IR/CATH LAB
INTRA_ARTERIAL | Status: DC | PRN
  Administered 2015-08-08: 200 ug

## 2015-08-08 MED ORDER — MIDAZOLAM HCL 2 MG/2ML IJ SOLN
INTRAMUSCULAR | Status: AC
  Filled 2015-08-08: qty 4

## 2015-08-08 MED ORDER — ASPIRIN EFFERVESCENT 325 MG PO TBEF: 325.0000 mg | EFFERVESCENT_TABLET | Freq: Four times a day (QID) | ORAL | Status: DC | PRN

## 2015-08-08 MED ORDER — HEPARIN (PORCINE) IN NACL 2-0.9 UNIT/ML-% IJ SOLN
INTRAMUSCULAR | Status: DC | PRN
  Administered 2015-08-08: 13:00:00

## 2015-08-08 MED ORDER — SODIUM CHLORIDE 0.9 % IJ SOLN: 3.0000 mL | INTRAMUSCULAR | Status: DC | PRN

## 2015-08-08 MED ORDER — HEPARIN (PORCINE) IN NACL 2-0.9 UNIT/ML-% IJ SOLN
INTRAMUSCULAR | Status: AC
  Filled 2015-08-08: qty 1500

## 2015-08-08 MED ORDER — HYDROCHLOROTHIAZIDE 12.5 MG PO CAPS
12.5000 mg | ORAL_CAPSULE | Freq: Every day | ORAL | Status: DC
  Administered 2015-08-09: 09:00:00 12.5 mg via ORAL
  Filled 2015-08-08: qty 1

## 2015-08-08 MED ORDER — SODIUM CHLORIDE 0.9 % WEIGHT BASED INFUSION
1.0000 mL/kg/h | INTRAVENOUS | Status: DC
  Administered 2015-08-08: 1 mL/kg/h via INTRAVENOUS

## 2015-08-08 MED ORDER — METOPROLOL TARTRATE 25 MG PO TABS
25.0000 mg | ORAL_TABLET | Freq: Two times a day (BID) | ORAL | Status: DC
  Administered 2015-08-08 – 2015-08-09 (×3): 25 mg via ORAL
  Filled 2015-08-08 (×2): qty 2
  Filled 2015-08-08: qty 1
  Filled 2015-08-08: qty 2

## 2015-08-08 MED ORDER — ATORVASTATIN CALCIUM 20 MG PO TABS
20.0000 mg | ORAL_TABLET | Freq: Every day | ORAL | Status: DC
  Administered 2015-08-08 – 2015-08-13 (×4): 20 mg via ORAL
  Filled 2015-08-08 (×7): qty 1

## 2015-08-08 MED ORDER — METFORMIN HCL 500 MG PO TABS
500.0000 mg | ORAL_TABLET | Freq: Every day | ORAL | Status: DC
  Filled 2015-08-08: qty 1

## 2015-08-08 MED ORDER — MIDAZOLAM HCL 2 MG/2ML IJ SOLN
INTRAMUSCULAR | Status: DC | PRN
  Administered 2015-08-08: 1 mg via INTRAVENOUS

## 2015-08-08 MED ORDER — LIDOCAINE HCL (PF) 1 % IJ SOLN
INTRAMUSCULAR | Status: DC | PRN
  Administered 2015-08-08: 2 mL via SUBCUTANEOUS

## 2015-08-08 MED ORDER — INSULIN ASPART 100 UNIT/ML ~~LOC~~ SOLN
0.0000 [IU] | Freq: Three times a day (TID) | SUBCUTANEOUS | Status: DC
  Administered 2015-08-08: 3 [IU] via SUBCUTANEOUS
  Administered 2015-08-09: 07:00:00 5 [IU] via SUBCUTANEOUS
  Administered 2015-08-09 (×2): 3 [IU] via SUBCUTANEOUS

## 2015-08-08 MED ORDER — HYDRALAZINE HCL 20 MG/ML IJ SOLN
INTRAMUSCULAR | Status: AC
  Filled 2015-08-08: qty 1

## 2015-08-08 MED ORDER — FENTANYL CITRATE (PF) 100 MCG/2ML IJ SOLN
INTRAMUSCULAR | Status: DC | PRN
  Administered 2015-08-08: 50 ug via INTRAVENOUS

## 2015-08-08 MED ORDER — SODIUM CHLORIDE 0.9 % WEIGHT BASED INFUSION
3.0000 mL/kg/h | INTRAVENOUS | Status: DC
  Administered 2015-08-08: 3 mL/kg/h via INTRAVENOUS

## 2015-08-08 MED ORDER — OXYCODONE-ACETAMINOPHEN 5-325 MG PO TABS: 1.0000 | ORAL_TABLET | ORAL | Status: DC | PRN

## 2015-08-08 MED ORDER — HEPARIN (PORCINE) IN NACL 2-0.9 UNIT/ML-% IJ SOLN
INTRAMUSCULAR | Status: DC | PRN
  Administered 2015-08-08: 13:00:00 via INTRA_ARTERIAL

## 2015-08-08 MED ORDER — HEPARIN SODIUM (PORCINE) 1000 UNIT/ML IJ SOLN
INTRAMUSCULAR | Status: DC | PRN
  Administered 2015-08-08: 2500 [IU] via INTRAVENOUS

## 2015-08-08 MED ORDER — SODIUM CHLORIDE 0.9 % WEIGHT BASED INFUSION
3.0000 mL/kg/h | INTRAVENOUS | Status: AC
  Administered 2015-08-08: 3 mL/kg/h via INTRAVENOUS

## 2015-08-08 SURGICAL SUPPLY — 10 items
CATH INFINITI 5 FR JL3.5 (CATHETERS) ×2 IMPLANT
CATH INFINITI JR4 5F (CATHETERS) ×2 IMPLANT
DEVICE RAD COMP TR BAND LRG (VASCULAR PRODUCTS) ×2 IMPLANT
GLIDESHEATH SLEND A-KIT 6F 22G (SHEATH) ×2 IMPLANT
KIT HEART LEFT (KITS) ×2 IMPLANT
PACK CARDIAC CATHETERIZATION (CUSTOM PROCEDURE TRAY) ×2 IMPLANT
TRANSDUCER W/STOPCOCK (MISCELLANEOUS) ×2 IMPLANT
TUBING CIL FLEX 10 FLL-RA (TUBING) ×2 IMPLANT
WIRE HI TORQ VERSACORE-J 145CM (WIRE) ×2 IMPLANT
WIRE SAFE-T 1.5MM-J .035X260CM (WIRE) ×2 IMPLANT

## 2015-08-08 NOTE — Progress Notes (Signed)
UR COMPLETED  

## 2015-08-08 NOTE — Progress Notes (Signed)
TR BAND REMOVAL  LOCATION:    right radial  DEFLATED PER PROTOCOL:    Yes.    TIME BAND OFF / DRESSING APPLIED: 1645   SITE UPON ARRIVAL:    Level 0  SITE AFTER BAND REMOVAL:    Level 0  CIRCULATION SENSATION AND MOVEMENT:    Within Normal Limits   Yes.    COMMENTS:     

## 2015-08-08 NOTE — Consult Note (Signed)
Reason for Consult:Single vessel CAD with unstable angina Referring Physician: Dr. Lauraine Rinne  Kelly Castillo is an 81 y.o. female.  HPI: Kelly Castillo is a 81 yo woman with a history of mitral regurgitation, hypertension and non-obstructive CAD. She now presents with a cc/o of substernal chest pressure with exertion. This pain has been occuring for the past couple of months. She now is at the point where she can experience CP even with changing position in bed. The pain typically lasts a few minutes and is relieved by rest. She has had episodes when she feels stressed as well. There is no radiation. She denies diaphoresis. She does c/o nausea but not always associated with CP.  Her cardiac history dates to 2004. She had a cath which showed non-obstructive CAD and 3+ MR. EF was 41%. A more recent echo showed an EF of 50% and mild MR. Her most recent cath was in March 2015. There was no hemodynamically significant CAD. EF was 55% and MR was mild.  She had a lexiscan myoview which demonstrated anterior ischemia. An echo on 9/7 showed moderate MR. Today she had cardiac catheterization which showed a tight ostial LAD stenosis. There was a 35% lesion in the circumflex.  Past Medical History  Diagnosis Date  . Non-ischemic cardiomyopathy   . GERD (gastroesophageal reflux disease)   . LBBB (left bundle branch block)   . HTN (hypertension)   . Anemia   . Lumbar disc disease   . Hypercholesterolemia   . Type II diabetes mellitus   . Poor short term memory     Past Surgical History  Procedure Laterality Date  . Orif ankle fracture Right 1980's  . Cataract extraction w/phaco  07/24/2011    Procedure: CATARACT EXTRACTION PHACO AND INTRAOCULAR LENS PLACEMENT (IOC);  Surgeon: Gemma Payor;  Location: [facility];  Service: Ophthalmology;  Laterality: Left;  CDE: 14.73  . Left heart catheterization with coronary angiogram N/A 01/31/2014    Procedure: LEFT HEART CATHETERIZATION WITH CORONARY ANGIOGRAM;   Surgeon: Peter M Swaziland, MD;  Location: [facility] CATH LAB;  Service: Cardiovascular;  Laterality: N/A;  . Fracture surgery    . Cardiac catheterization N/A 08/08/2015    Procedure: Left Heart Cath and Coronary Angiography;  Surgeon: Lyn Records, MD;  Location: [facility] INVASIVE CV LAB;  Service: Cardiovascular;  Laterality: N/A;  . Cardiac catheterization  01/31/2014  . Appendectomy  1970's    [place]  . Vaginal hysterectomy  1980's  . Dilation and curettage of uterus    . Tubal ligation    . Cataract extraction w/ intraocular lens implant Right   . Eye surgery Bilateral     "laser; related to diabetes"    Family History  Problem Relation Age of Onset  . Anesthesia problems Neg Hx   . Hypotension Neg Hx   . Malignant hyperthermia Neg Hx   . Pseudochol deficiency Neg Hx     Social History:  reports that she has never smoked. She has never used smokeless tobacco. She reports that she does not drink alcohol or use illicit drugs.  Allergies: No Known Allergies  Medications:  Prior to Admission:  Prescriptions prior to admission  Medication Sig Dispense Refill Last Dose  . aspirin EC 81 MG tablet Take 1 tablet (81 mg total) by mouth daily. 90 tablet 3 08/08/2015 at 0600  . aspirin-sod bicarb-citric acid (ALKA-SELTZER) 325 MG TBEF tablet Take 325 mg by mouth every 6 (six) hours as needed (stomach issues).     Marland Kitchen  atorvastatin (LIPITOR) 20 MG tablet Take 1 tablet (20 mg total) by mouth daily at 6 PM. 30 tablet 5 08/07/2015 at 1000  . glimepiride (AMARYL) 4 MG tablet Take 4 mg by mouth 2 (two) times daily.     08/07/2015 at 1000  . hydrochlorothiazide (MICROZIDE) 12.5 MG capsule Take 12.5 mg by mouth daily.   08/07/2015 at 1800  . iron polysaccharides (FERREX 150) 150 MG capsule Take 150 mg by mouth daily.     08/07/2015 at 1800  . lisinopril (PRINIVIL,ZESTRIL) 40 MG tablet Take 40 mg by mouth daily.   08/07/2015 at 1000  . metFORMIN (GLUCOPHAGE) 500 MG tablet Take 500 mg by mouth daily.   08/07/2015  at 1000  . metoprolol tartrate (LOPRESSOR) 25 MG tablet Take 1 tablet (25 mg total) by mouth 2 (two) times daily. 60 tablet 6 Unknown at Unknown time  . nitroGLYCERIN (NITROSTAT) 0.4 MG SL tablet Place 1 tablet (0.4 mg total) under the tongue every 5 (five) minutes as needed for chest pain. 25 tablet 6 Unknown at Unknown time    Results for orders placed or performed during the hospital encounter of 08/08/15 (from the past 48 hour(s))  Glucose, capillary     Status: Abnormal   Collection Time: 08/08/15  7:50 AM  Result Value Ref Range   Glucose-Capillary 172 (H) 65 - 99 mg/dL  Glucose, capillary     Status: Abnormal   Collection Time: 08/08/15  1:46 PM  Result Value Ref Range   Glucose-Capillary 141 (H) 65 - 99 mg/dL  Glucose, capillary     Status: Abnormal   Collection Time: 08/08/15  5:18 PM  Result Value Ref Range   Glucose-Capillary 155 (H) 65 - 99 mg/dL    Dg Chest 2 View  0/45/4098   CLINICAL DATA:  Mitral valve insufficiency.  EXAM: CHEST  2 VIEW  COMPARISON:  None.  FINDINGS: Mediastinum and hilar structures normal. Lungs are clear. Heart size normal. No pleural effusion or pneumothorax. No acute bony abnormality identified.  IMPRESSION: No acute cardiopulmonary disease.   Electronically Signed   By: Maisie Fus  Register   On: 08/07/2015 12:25    Review of Systems  Constitutional: Positive for malaise/fatigue. Negative for fever and chills.  Respiratory: Positive for shortness of breath. Negative for cough and wheezing.   Cardiovascular: Positive for chest pain. Negative for palpitations, orthopnea, claudication, leg swelling and PND.  Gastrointestinal: Positive for nausea. Negative for vomiting and abdominal pain.  Genitourinary: Negative for dysuria and urgency.  Musculoskeletal: Positive for joint pain. Negative for myalgias.  Neurological: Negative for dizziness, speech change, focal weakness and loss of consciousness.       Memory loss per patient's daughter  All other  systems reviewed and are negative.  Blood pressure 173/43, pulse 66, temperature 97.8 F (36.6 C), temperature source Oral, resp. rate 20, height 5' (1.524 m), weight 102 lb (46.267 kg), SpO2 100 %. Physical Exam  Vitals reviewed. Constitutional: She is oriented to person, place, and time. She appears well-developed and well-nourished. No distress.  HENT:  Head: Normocephalic and atraumatic.  Mouth/Throat: No oropharyngeal exudate.  Eyes: Conjunctivae and EOM are normal. No scleral icterus.  Neck: Normal range of motion. Neck supple. No thyromegaly present.  No carotid bruit  Cardiovascular: Normal rate, regular rhythm and intact distal pulses.  Exam reveals no gallop and no friction rub.   Murmur (faint systolic) heard. Respiratory: Effort normal and breath sounds normal. She has no wheezes. She has no rales.  GI:  Soft. Bowel sounds are normal. She exhibits no distension. There is no tenderness.  Musculoskeletal: She exhibits no edema.  Lymphadenopathy:    She has no cervical adenopathy.  Neurological: She is alert and oriented to person, place, and time. No cranial nerve deficit.  Motor 5/5 bilaterally  Skin: Skin is warm and dry.  Psychiatric: She has a normal mood and affect.      ECHOCARDIOGRAM 07/25/2015 Study Conclusions  - Left ventricle: The cavity size was normal. Wall thickness was normal. Systolic function was mildly reduced. The estimated ejection fraction was in the range of 45% to 50%. There is akinesis of the basal-midanteroseptal myocardium. Doppler parameters are consistent with abnormal left ventricular relaxation (grade 1 diastolic dysfunction). - Aortic valve: There was trivial regurgitation. - Mitral valve: There was moderate regurgitation. - Left atrium: The atrium was mildly dilated.  Impressions:  - When compared to prior echocardiogram, anteroseptal wall motion abnormality is currently more pronounced.  CARDIAC  CATHETERIZATION Conclusion    1. Ost LAD lesion, 90% stenosed. 2. Prox Cx to Mid Cx lesion, 35% stenosed.   Severe ostial LAD in a 81 year old diabetic.  Otherwise widely patent coronaries with less than 50% mid circumflex obstruction.  Overall normal left ventricular function.   Recommendations:   Treatment options include all pump LIMA to LAD versus LAD ostial stent. Given her diabetic status and otherwise relatively good health, LIMA should be considered. We will be prepared to do ostial LAD stent tomorrow if she is deemed non-surgical.     Assessment/Plan: 81 yo woman with severe single vessel, ostial LAD, CAD with unstable angina. She needs revascularization.  I agree with Dr. Katrinka Blazing that a LIMA to LAD is the best option for revascularization. This can be done off pump. She did have moderate MR on her most recent echo, but has had some degree of MR for years. Will d/w Dr. Jens Som whether he feels there is a need for mitral repair at the time of surgery but my bias would be to do OPCAB and leave the mitral alone given she has no heart failure symptoms.  I discussed the general nature of the procedure, the need for general anesthesia, and the incisions to be used with Mrs. Gatti and her son and daughter. We discussed the expected hospital stay, overall recovery and short and long term outcomes. I reviewed the indications, risks, benefits and alternatives. They understand the risks include, but are not limited to death, stroke, MI, DVT/PE, bleeding, possible need for transfusion, infections, cardiac arrhythmias, and other organ system dysfunction including respiratory, renal, or GI complications. She accepts the risks and agrees to proceed.  Plan OPCAB Friday 9/23 pending review of the echocardiogram.  Loreli Slot 08/08/2015, 6:24 PM

## 2015-08-09 ENCOUNTER — Inpatient Hospital Stay (HOSPITAL_COMMUNITY): Payer: Medicare HMO

## 2015-08-09 DIAGNOSIS — Z01810 Encounter for preprocedural cardiovascular examination: Secondary | ICD-10-CM

## 2015-08-09 DIAGNOSIS — I2510 Atherosclerotic heart disease of native coronary artery without angina pectoris: Secondary | ICD-10-CM

## 2015-08-09 DIAGNOSIS — I340 Nonrheumatic mitral (valve) insufficiency: Secondary | ICD-10-CM

## 2015-08-09 LAB — URINALYSIS, ROUTINE W REFLEX MICROSCOPIC
Bilirubin Urine: NEGATIVE
Glucose, UA: 100 mg/dL — AB
Hgb urine dipstick: NEGATIVE
Ketones, ur: 15 mg/dL — AB
Nitrite: NEGATIVE
Protein, ur: NEGATIVE mg/dL
Specific Gravity, Urine: 1.023 (ref 1.005–1.030)
Urobilinogen, UA: 0.2 mg/dL (ref 0.0–1.0)
pH: 5 (ref 5.0–8.0)

## 2015-08-09 LAB — PULMONARY FUNCTION TEST
DL/VA % pred: 88 %
DL/VA: 3.7 ml/min/mmHg/L
DLCO cor % pred: 70 %
DLCO cor: 12.76 ml/min/mmHg
DLCO unc % pred: 62 %
DLCO unc: 11.41 ml/min/mmHg
FEF 25-75 Post: 3.35 L/sec
FEF 25-75 Pre: 2.1 L/sec
FEF2575-%Change-Post: 59 %
FEF2575-%Pred-Post: 328 %
FEF2575-%Pred-Pre: 205 %
FEV1-%Change-Post: 10 %
FEV1-%Pred-Post: 174 %
FEV1-%Pred-Pre: 157 %
FEV1-Post: 2.05 L
FEV1-Pre: 1.85 L
FEV1FVC-%Change-Post: 0 %
FEV1FVC-%Pred-Pre: 109 %
FEV6-%Change-Post: 12 %
FEV6-%Pred-Post: 172 %
FEV6-%Pred-Pre: 152 %
FEV6-Post: 2.51 L
FEV6-Pre: 2.23 L
FEV6FVC-%Change-Post: 1 %
FEV6FVC-%Pred-Post: 106 %
FEV6FVC-%Pred-Pre: 104 %
FVC-%Change-Post: 10 %
FVC-%Pred-Post: 162 %
FVC-%Pred-Pre: 145 %
FVC-Post: 2.51 L
FVC-Pre: 2.26 L
Post FEV1/FVC ratio: 82 %
Post FEV6/FVC ratio: 100 %
Pre FEV1/FVC ratio: 82 %
Pre FEV6/FVC Ratio: 98 %
RV % pred: 74 %
RV: 1.6 L
TLC % pred: 90 %
TLC: 3.97 L

## 2015-08-09 LAB — GLUCOSE, CAPILLARY
Glucose-Capillary: 210 mg/dL — ABNORMAL HIGH (ref 65–99)
Glucose-Capillary: 154 mg/dL — ABNORMAL HIGH (ref 65–99)
Glucose-Capillary: 160 mg/dL — ABNORMAL HIGH (ref 65–99)
Glucose-Capillary: 191 mg/dL — ABNORMAL HIGH (ref 65–99)

## 2015-08-09 LAB — URINE MICROSCOPIC-ADD ON

## 2015-08-09 LAB — SURGICAL PCR SCREEN
MRSA, PCR: NEGATIVE
Staphylococcus aureus: POSITIVE — AB

## 2015-08-09 LAB — TYPE AND SCREEN
ABO/RH(D): A POS
Antibody Screen: NEGATIVE

## 2015-08-09 LAB — ABO/RH: ABO/RH(D): A POS

## 2015-08-09 MED ORDER — METOPROLOL TARTRATE 12.5 MG HALF TABLET
12.5000 mg | ORAL_TABLET | Freq: Once | ORAL | Status: AC
  Administered 2015-08-10: 12.5 mg via ORAL
  Filled 2015-08-09: qty 1

## 2015-08-09 MED ORDER — DEXTROSE 5 % IV SOLN
1.5000 g | INTRAVENOUS | Status: AC
  Administered 2015-08-10: 1.5 g via INTRAVENOUS
  Filled 2015-08-09 (×2): qty 1.5

## 2015-08-09 MED ORDER — SODIUM CHLORIDE 0.9 % IV SOLN
INTRAVENOUS | Status: DC
  Filled 2015-08-09: qty 30

## 2015-08-09 MED ORDER — CHLORHEXIDINE GLUCONATE 0.12 % MT SOLN
15.0000 mL | Freq: Once | OROMUCOSAL | Status: AC
  Administered 2015-08-10: 15 mL via OROMUCOSAL
  Filled 2015-08-09: qty 15

## 2015-08-09 MED ORDER — MAGNESIUM SULFATE 50 % IJ SOLN
40.0000 meq | INTRAMUSCULAR | Status: DC
  Filled 2015-08-09: qty 10

## 2015-08-09 MED ORDER — DEXTROSE 5 % IV SOLN
30.0000 ug/min | INTRAVENOUS | Status: AC
  Administered 2015-08-10: 10 ug/min via INTRAVENOUS
  Filled 2015-08-09: qty 2

## 2015-08-09 MED ORDER — ALBUTEROL SULFATE (2.5 MG/3ML) 0.083% IN NEBU
2.5000 mg | INHALATION_SOLUTION | Freq: Once | RESPIRATORY_TRACT | Status: AC
  Administered 2015-08-09: 15:00:00 2.5 mg via RESPIRATORY_TRACT

## 2015-08-09 MED ORDER — AMINOCAPROIC ACID 250 MG/ML IV SOLN
INTRAVENOUS | Status: DC
  Filled 2015-08-09: qty 40

## 2015-08-09 MED ORDER — EPINEPHRINE HCL 1 MG/ML IJ SOLN
0.0000 ug/min | INTRAVENOUS | Status: DC
  Filled 2015-08-09: qty 4

## 2015-08-09 MED ORDER — ~~LOC~~ CARDIAC SURGERY, PATIENT & FAMILY EDUCATION
Freq: Once | Status: AC
  Administered 2015-08-09: 15:00:00
  Filled 2015-08-09: qty 1

## 2015-08-09 MED ORDER — DOPAMINE-DEXTROSE 3.2-5 MG/ML-% IV SOLN
0.0000 ug/kg/min | INTRAVENOUS | Status: DC
  Filled 2015-08-09: qty 250

## 2015-08-09 MED ORDER — VANCOMYCIN HCL IN DEXTROSE 1-5 GM/200ML-% IV SOLN
1000.0000 mg | INTRAVENOUS | Status: AC
  Administered 2015-08-10: 1000 mg via INTRAVENOUS
  Filled 2015-08-09: qty 200

## 2015-08-09 MED ORDER — PLASMA-LYTE 148 IV SOLN
INTRAVENOUS | Status: AC
  Administered 2015-08-10: 500 mL
  Filled 2015-08-09: qty 2.5

## 2015-08-09 MED ORDER — DIAZEPAM 2 MG PO TABS
2.0000 mg | ORAL_TABLET | Freq: Once | ORAL | Status: AC
  Administered 2015-08-10: 2 mg via ORAL
  Filled 2015-08-09: qty 1

## 2015-08-09 MED ORDER — NITROGLYCERIN IN D5W 200-5 MCG/ML-% IV SOLN
2.0000 ug/min | INTRAVENOUS | Status: AC
Start: 2015-08-10 — End: 2015-08-10
  Administered 2015-08-10: 10 ug/min via INTRAVENOUS
  Filled 2015-08-09: qty 250

## 2015-08-09 MED ORDER — BISACODYL 5 MG PO TBEC
5.0000 mg | DELAYED_RELEASE_TABLET | Freq: Once | ORAL | Status: AC
  Administered 2015-08-09: 5 mg via ORAL
  Filled 2015-08-09: qty 1

## 2015-08-09 MED ORDER — CHLORHEXIDINE GLUCONATE 4 % EX LIQD
60.0000 mL | Freq: Once | CUTANEOUS | Status: AC
  Administered 2015-08-10: 4 via TOPICAL
  Filled 2015-08-09: qty 60

## 2015-08-09 MED ORDER — POTASSIUM CHLORIDE 2 MEQ/ML IV SOLN
80.0000 meq | INTRAVENOUS | Status: DC
  Filled 2015-08-09: qty 40

## 2015-08-09 MED ORDER — DEXMEDETOMIDINE HCL IN NACL 400 MCG/100ML IV SOLN
0.1000 ug/kg/h | INTRAVENOUS | Status: AC
  Administered 2015-08-10: .7 ug/kg/h via INTRAVENOUS
  Filled 2015-08-09: qty 100

## 2015-08-09 MED ORDER — TEMAZEPAM 15 MG PO CAPS: 15.0000 mg | ORAL_CAPSULE | Freq: Once | ORAL | Status: DC | PRN

## 2015-08-09 MED ORDER — CHLORHEXIDINE GLUCONATE 4 % EX LIQD
60.0000 mL | Freq: Once | CUTANEOUS | Status: AC
  Administered 2015-08-09: 4 via TOPICAL
  Filled 2015-08-09: qty 60

## 2015-08-09 MED ORDER — ALPRAZOLAM 0.25 MG PO TABS: 0.2500 mg | ORAL_TABLET | ORAL | Status: DC | PRN

## 2015-08-09 MED ORDER — SODIUM CHLORIDE 0.9 % IV SOLN
INTRAVENOUS | Status: AC
  Administered 2015-08-10: 3.9 [IU]/h via INTRAVENOUS
  Filled 2015-08-09: qty 2.5

## 2015-08-09 MED ORDER — CEFUROXIME SODIUM 750 MG IJ SOLR
750.0000 mg | INTRAMUSCULAR | Status: DC
Start: 2015-08-10 — End: 2015-08-10
  Filled 2015-08-09: qty 750

## 2015-08-09 NOTE — Progress Notes (Signed)
CARDIAC REHAB PHASE I   PRE:  Rate/Rhythm: 84 SR  BP:  Sitting: 134/78        SaO2: 99 RA  MODE:  Ambulation: 500 ft   POST:  Rate/Rhythm: 85 SR  BP:  Sitting: 149/52         SaO2: 100 RA  Pt ambulated 500 ft on RA, independent, steady gait, tolerated well. Pt denies CP, dizziness, DOE, declined rest stop. Completed [facility] pre-op education with pt daughter at bedside. Reviewed cardiac surgery book, OHS guidelines, IS, sternal precautions and activity progression. Pt and daughter verbalized understanding. Pt and daughter state they have watched the cardiac surgery videos. Pt to bed per pt request after walk, call bell within reach. Will follow post-op.   [phone]  Joylene Grapes, RN, BSN 08/09/2015 9:45 AM

## 2015-08-09 NOTE — Progress Notes (Signed)
MD notified of pauses 1.40-1.57 sec and asymptomatic. I will continue to monitor.

## 2015-08-09 NOTE — Progress Notes (Signed)
Patient Name: Kelly Castillo Date of Encounter: 08/09/2015   SUBJECTIVE  Feeling well. No chest pain, sob or palpitations. OPCAD tomorrow.   CURRENT MEDS . aspirin EC  81 mg Oral Daily  . atorvastatin  20 mg Oral q1800  . glimepiride  4 mg Oral BID WC  . hydrochlorothiazide  12.5 mg Oral Daily  . insulin aspart  0-15 Units Subcutaneous TID WC  . iron polysaccharides  150 mg Oral Daily  . lisinopril  40 mg Oral Daily  . [START ON 08/11/2015] metFORMIN  500 mg Oral Q breakfast  . metoprolol tartrate  25 mg Oral BID  . sodium chloride  3 mL Intravenous Q12H    OBJECTIVE  Filed Vitals:   08/08/15 1700 08/08/15 1949 08/09/15 0453 08/09/15 0759  BP: 158/45 128/47 141/45 136/41  Pulse:  95 76 83  Temp:  98.2 F (36.8 C) 97.9 F (36.6 C) 97.7 F (36.5 C)  TempSrc:  Oral Oral Oral  Resp: Height:      Weight:   103 lb 3.2 oz (46.811 kg)   SpO2: 100% 100% 100% 100%    Intake/Output Summary (Last 24 hours) at 08/09/15 0926 Last data filed at 08/08/15 1731  Gross per 24 hour  Intake 208.35 ml  Output   1100 ml  Net -891.65 ml   Filed Weights   08/08/15 0748 08/09/15 0453  Weight: 102 lb (46.267 kg) 103 lb 3.2 oz (46.811 kg)    PHYSICAL EXAM  General: Pleasant, NAD. Neuro: Alert and oriented X 3. Moves all extremities spontaneously. Psych: Normal affect. HEENT:  Normal  Neck: Supple without bruits or JVD. Lungs:  Resp regular and unlabored, CTA. Heart: RRR no s3, s4. Faint systolic murmurs. Abdomen: Soft, non-tender, non-distended, BS + x 4.  Extremities: No clubbing, cyanosis or edema. DP/PT/Radials 2+ and equal bilaterally. Right radial cath site without hematoma or bruit.   Accessory Clinical Findings  CBC  Recent Labs  08/06/15 1120  WBC 4.9  NEUTROABS 3.0  HGB 10.4*  HCT 32.4*  MCV 80.4  PLT 257   Basic Metabolic Panel  Recent Labs  08/06/15 1120  NA 138  K 4.6  CL 104  CO2 29  GLUCOSE 227*  BUN 21  CREATININE 0.76    CALCIUM 9.3    TELE  NSR with new pause.   Radiology/Studies  Dg Chest 2 View  08/07/2015   CLINICAL DATA:  Mitral valve insufficiency.  EXAM: CHEST  2 VIEW  COMPARISON:  None.  FINDINGS: Mediastinum and hilar structures normal. Lungs are clear. Heart size normal. No pleural effusion or pneumothorax. No acute bony abnormality identified.  IMPRESSION: No acute cardiopulmonary disease.   Electronically Signed   By: Maisie Fus  Register   On: 08/07/2015 12:25   Cath 08/08/15 Conclusion    1. Ost LAD lesion, 90% stenosed. 2. Prox Cx to Mid Cx lesion, 35% stenosed.   Severe ostial LAD in a 81 year old diabetic.  Otherwise widely patent coronaries with less than 50% mid circumflex obstruction.  Overall normal left ventricular function.   Recommendations:   Treatment options include all pump LIMA to LAD versus LAD ostial stent. Given her diabetic status and otherwise relatively good health, LIMA should be considered. We will be prepared to do ostial LAD stent tomorrow if she is deemed non-surgical.      ASSESSMENT AND PLAN Active Problems:   MITRAL REGURGITATION   Essential hypertension   Angina pectoris, crescendo   Abnormal  nuclear stress test   Plan: Cath showed Ost LAD lesion, 90% stenosed. Prox Cx to Mid Cx lesion, 35% stenosed. Seen by CT surgery. Plan for OPCAB tomorrow. An echo on 9/7 showed moderate MR. She has no HF symptoms. Cr stable post cath. Metformin on hold. Continue SSI. Continue ASA, statin, ACE and BB.   Signed, Bhagat,Bhavinkumar PA-C  I have personally seen and examined this patient with B Bhagat, PA-C. I agree with the assessment and plan as outlined above. She is admitted with unstable angina. She was found to have severe ostial LAD stenosis. Plan for CABG tomorrow. She also has moderate MR but this has been stable for years. She has no signs of CHF. No chest pain or SOB today. Ambulating. Lungs clear. RRR. No LE edema. Cath site right wrist is ok.    MCALHANY,CHRISTOPHER 08/09/2015 10:49 AM

## 2015-08-09 NOTE — Progress Notes (Signed)
1 Day Post-Op Procedure(s) (LRB): Left Heart Cath and Coronary Angiography (N/A) Subjective: No issues overnight Feels well today  Objective: Vital signs in last 24 hours: Temp:  [97.7 F (36.5 C)-98.2 F (36.8 C)] 97.7 F (36.5 C) (09/22 0759) Pulse Rate:  [0-95] 83 (09/22 0759) Cardiac Rhythm:  [-] Normal sinus rhythm (09/22 0857) Resp:  [0-21] 16 (09/22 0759) BP: (128-209)/(33-95) 136/41 mmHg (09/22 0759) SpO2:  [0 %-100 %] 100 % (09/22 0759) Weight:  [103 lb 3.2 oz (46.811 kg)] 103 lb 3.2 oz (46.811 kg) (09/22 0453)  Hemodynamic parameters for last 24 hours:    Intake/Output from previous day: 09/21 0701 - 09/22 0700 In: 208.4 [I.V.:208.4] Out: 1100 [Urine:1100] Intake/Output this shift:    General appearance: alert and no distress Heart: regular rate and rhythm Lungs: clear to auscultation bilaterally  Lab Results: No results for input(s): WBC, HGB, HCT, PLT in the last 72 hours. BMET: No results for input(s): NA, K, CL, CO2, GLUCOSE, BUN, CREATININE, CALCIUM in the last 72 hours.  PT/INR: No results for input(s): LABPROT, INR in the last 72 hours. ABG No results found for: PHART, HCO3, TCO2, ACIDBASEDEF, O2SAT CBG (last 3)   Recent Labs  08/08/15 2140 08/09/15 0636 08/09/15 1224  GLUCAP 83 210* 154*    Assessment/Plan: S/P Procedure(s) (LRB): Left Heart Cath and Coronary Angiography (N/A) -  D/ w Dr. Jens Som. Moderate MR unchanged, does not need mitral procedure  Will proceed with OPCAB LIMA to LAD in AM  All of the patient's questions were answered   LOS: 1 day    Loreli Slot 08/09/2015

## 2015-08-09 NOTE — Progress Notes (Signed)
Pre-op Cardiac Surgery  Carotid Findings:  1-39% ICA stenosis.  Vertebral artery flow is antegrade.   Upper Extremity Right Left  Brachial Pressures 156T 140T  Radial Waveforms T T  Ulnar Waveforms T T  Palmar Arch (Allen's Test) WNL WNL   Findings:  WNL    Lower  Extremity Right Left  Dorsalis Pedis    Anterior Tibial 148T 144T  Posterior Tibial 155T 147T  Ankle/Brachial Indices 0.99 0.94    Findings:  ABIs WNL

## 2015-08-10 ENCOUNTER — Inpatient Hospital Stay (HOSPITAL_COMMUNITY): Payer: Medicare HMO | Admitting: Anesthesiology

## 2015-08-10 ENCOUNTER — Encounter (HOSPITAL_COMMUNITY)
Admission: AD | Disposition: A | Discharge status: Home / Self Care | Payer: Medicare HMO | Source: Ambulatory Visit | Attending: Thoracic Surgery (Cardiothoracic Vascular Surgery)

## 2015-08-10 ENCOUNTER — Inpatient Hospital Stay (HOSPITAL_COMMUNITY): Payer: Medicare HMO

## 2015-08-10 ENCOUNTER — Inpatient Hospital Stay (HOSPITAL_COMMUNITY)
Admission: AD | Admit: 2015-08-10 | Discharge: 2015-08-10 | Disposition: A | Discharge status: Home / Self Care | Payer: Medicare HMO | Source: Ambulatory Visit | Attending: Thoracic Surgery (Cardiothoracic Vascular Surgery) | Admitting: Thoracic Surgery (Cardiothoracic Vascular Surgery)

## 2015-08-10 DIAGNOSIS — Z951 Presence of aortocoronary bypass graft: Secondary | ICD-10-CM

## 2015-08-10 DIAGNOSIS — I2510 Atherosclerotic heart disease of native coronary artery without angina pectoris: Secondary | ICD-10-CM

## 2015-08-10 HISTORY — PX: CORONARY ARTERY BYPASS GRAFT: SHX141

## 2015-08-10 HISTORY — PX: TEE WITHOUT CARDIOVERSION: SHX5443

## 2015-08-10 LAB — CREATININE, SERUM
Creatinine, Ser: 0.69 mg/dL (ref 0.44–1.00)
GFR calc Af Amer: >60 mL/min (ref >60)
GFR calc non Af Amer: >60 mL/min (ref >60)

## 2015-08-10 LAB — BLOOD GAS, ARTERIAL
Acid-Base Excess: 2.4 mmol/L — ABNORMAL HIGH (ref 0.0–2.0)
Bicarbonate: 26.5 mEq/L — ABNORMAL HIGH (ref 20.0–24.0)
Drawn by: 405301
FIO2: 0.21
O2 Saturation: 97.1 %
Patient temperature: 98.6
TCO2: 27.7 mmol/L (ref 0–100)
pCO2 arterial: 41.4 mmHg (ref 35.0–45.0)
pH, Arterial: 7.422 (ref 7.350–7.450)
pO2, Arterial: 85.5 mmHg (ref 80.0–100.0)

## 2015-08-10 LAB — POCT I-STAT, CHEM 8
BUN: 18 mg/dL (ref 6–20)
BUN: 16 mg/dL (ref 6–20)
BUN: 14 mg/dL (ref 6–20)
BUN: 12 mg/dL (ref 6–20)
Calcium, Ion: 1.22 mmol/L (ref 1.13–1.30)
Calcium, Ion: 1.22 mmol/L (ref 1.13–1.30)
Calcium, Ion: 1.24 mmol/L (ref 1.13–1.30)
Calcium, Ion: 1.26 mmol/L (ref 1.13–1.30)
Chloride: 100 mmol/L — ABNORMAL LOW (ref 101–111)
Chloride: 99 mmol/L — ABNORMAL LOW (ref 101–111)
Chloride: 101 mmol/L (ref 101–111)
Chloride: 105 mmol/L (ref 101–111)
Creatinine, Ser: 0.6 mg/dL (ref 0.44–1.00)
Creatinine, Ser: 0.5 mg/dL (ref 0.44–1.00)
Creatinine, Ser: 0.5 mg/dL (ref 0.44–1.00)
Creatinine, Ser: 0.6 mg/dL (ref 0.44–1.00)
Glucose, Bld: 254 mg/dL — ABNORMAL HIGH (ref 65–99)
Glucose, Bld: 270 mg/dL — ABNORMAL HIGH (ref 65–99)
Glucose, Bld: 203 mg/dL — ABNORMAL HIGH (ref 65–99)
Glucose, Bld: 143 mg/dL — ABNORMAL HIGH (ref 65–99)
HCT: 26 % — ABNORMAL LOW (ref 36.0–46.0)
HCT: 21 % — ABNORMAL LOW (ref 36.0–46.0)
HCT: 23 % — ABNORMAL LOW (ref 36.0–46.0)
HCT: 22 % — ABNORMAL LOW (ref 36.0–46.0)
Hemoglobin: 8.8 g/dL — ABNORMAL LOW (ref 12.0–15.0)
Hemoglobin: 7.1 g/dL — ABNORMAL LOW (ref 12.0–15.0)
Hemoglobin: 7.8 g/dL — ABNORMAL LOW (ref 12.0–15.0)
Hemoglobin: 7.5 g/dL — ABNORMAL LOW (ref 12.0–15.0)
Potassium: 3.6 mmol/L (ref 3.5–5.1)
Potassium: 3.2 mmol/L — ABNORMAL LOW (ref 3.5–5.1)
Potassium: 3 mmol/L — ABNORMAL LOW (ref 3.5–5.1)
Potassium: 4.2 mmol/L (ref 3.5–5.1)
Sodium: 136 mmol/L (ref 135–145)
Sodium: 135 mmol/L (ref 135–145)
Sodium: 139 mmol/L (ref 135–145)
Sodium: 139 mmol/L (ref 135–145)
TCO2: 27 mmol/L (ref 0–100)
TCO2: 27 mmol/L (ref 0–100)
TCO2: 29 mmol/L (ref 0–100)
TCO2: 28 mmol/L (ref 0–100)

## 2015-08-10 LAB — CBC
HCT: 30.8 % — ABNORMAL LOW (ref 36.0–46.0)
HCT: 26.7 % — ABNORMAL LOW (ref 36.0–46.0)
HCT: 23.7 % — ABNORMAL LOW (ref 36.0–46.0)
Hemoglobin: 10 g/dL — ABNORMAL LOW (ref 12.0–15.0)
Hemoglobin: 8.9 g/dL — ABNORMAL LOW (ref 12.0–15.0)
Hemoglobin: 7.7 g/dL — ABNORMAL LOW (ref 12.0–15.0)
MCH: 26.5 pg (ref 26.0–34.0)
MCH: 26.6 pg (ref 26.0–34.0)
MCH: 26.4 pg (ref 26.0–34.0)
MCHC: 32.5 g/dL (ref 30.0–36.0)
MCHC: 33.3 g/dL (ref 30.0–36.0)
MCHC: 32.5 g/dL (ref 30.0–36.0)
MCV: 81.7 fL (ref 78.0–100.0)
MCV: 79.9 fL (ref 78.0–100.0)
MCV: 81.2 fL (ref 78.0–100.0)
Platelets: 232 10*3/uL (ref 150–400)
Platelets: 133 10*3/uL — ABNORMAL LOW (ref 150–400)
Platelets: 140 K/uL — ABNORMAL LOW (ref 150–400)
RBC: 3.77 MIL/uL — ABNORMAL LOW (ref 3.87–5.11)
RBC: 3.34 MIL/uL — ABNORMAL LOW (ref 3.87–5.11)
RBC: 2.92 MIL/uL — ABNORMAL LOW (ref 3.87–5.11)
RDW: 15.1 % (ref 11.5–15.5)
RDW: 15 % (ref 11.5–15.5)
RDW: 15.2 % (ref 11.5–15.5)
WBC: 5.2 10*3/uL (ref 4.0–10.5)
WBC: 2.9 10*3/uL — ABNORMAL LOW (ref 4.0–10.5)
WBC: 7 K/uL (ref 4.0–10.5)

## 2015-08-10 LAB — POCT I-STAT 3, ART BLOOD GAS (G3+)
Acid-Base Excess: 3 mmol/L — ABNORMAL HIGH (ref 0.0–2.0)
Acid-Base Excess: 2 mmol/L (ref 0.0–2.0)
Acid-base deficit: 1 mmol/L (ref 0.0–2.0)
Bicarbonate: 26.4 mEq/L — ABNORMAL HIGH (ref 20.0–24.0)
Bicarbonate: 25.4 mEq/L — ABNORMAL HIGH (ref 20.0–24.0)
Bicarbonate: 28.7 mEq/L — ABNORMAL HIGH (ref 20.0–24.0)
O2 Saturation: 99 %
O2 Saturation: 98 %
O2 Saturation: 100 %
Patient temperature: 34
Patient temperature: 37.3
Patient temperature: 37
TCO2: 28 mmol/L (ref 0–100)
TCO2: 27 mmol/L (ref 0–100)
TCO2: 30 mmol/L (ref 0–100)
pCO2 arterial: 32.1 mmHg — ABNORMAL LOW (ref 35.0–45.0)
pCO2 arterial: 51.6 mmHg — ABNORMAL HIGH (ref 35.0–45.0)
pCO2 arterial: 55.5 mmHg — ABNORMAL HIGH (ref 35.0–45.0)
pH, Arterial: 7.513 — ABNORMAL HIGH (ref 7.350–7.450)
pH, Arterial: 7.301 — ABNORMAL LOW (ref 7.350–7.450)
pH, Arterial: 7.322 — ABNORMAL LOW (ref 7.350–7.450)
pO2, Arterial: 116 mmHg — ABNORMAL HIGH (ref 80.0–100.0)
pO2, Arterial: 123 mmHg — ABNORMAL HIGH (ref 80.0–100.0)
pO2, Arterial: 254 mmHg — ABNORMAL HIGH (ref 80.0–100.0)

## 2015-08-10 LAB — APTT
aPTT: 25 seconds (ref 24–37)
aPTT: 28 seconds (ref 24–37)

## 2015-08-10 LAB — BASIC METABOLIC PANEL
Anion gap: 6 (ref 5–15)
BUN: 18 mg/dL (ref 6–20)
CO2: 29 mmol/L (ref 22–32)
Calcium: 9.3 mg/dL (ref 8.9–10.3)
Chloride: 102 mmol/L (ref 101–111)
Creatinine, Ser: 0.95 mg/dL (ref 0.44–1.00)
GFR calc Af Amer: >60 mL/min (ref >60)
GFR calc non Af Amer: 55 mL/min — ABNORMAL LOW (ref >60)
Glucose, Bld: 231 mg/dL — ABNORMAL HIGH (ref 65–99)
Potassium: 3.9 mmol/L (ref 3.5–5.1)
Sodium: 137 mmol/L (ref 135–145)

## 2015-08-10 LAB — PROTIME-INR
INR: 1.29 (ref 0.00–1.49)
Prothrombin Time: 16.2 seconds — ABNORMAL HIGH (ref 11.6–15.2)

## 2015-08-10 LAB — MAGNESIUM: Magnesium: 3 mg/dL — ABNORMAL HIGH (ref 1.7–2.4)

## 2015-08-10 LAB — GLUCOSE, CAPILLARY
Glucose-Capillary: 178 mg/dL — ABNORMAL HIGH (ref 65–99)
Glucose-Capillary: 47 mg/dL — ABNORMAL LOW (ref 65–99)
Glucose-Capillary: 88 mg/dL (ref 65–99)
Glucose-Capillary: 176 mg/dL — ABNORMAL HIGH (ref 65–99)
Glucose-Capillary: 30 mg/dL — CL (ref 65–99)
Glucose-Capillary: 120 mg/dL — ABNORMAL HIGH (ref 65–99)
Glucose-Capillary: 71 mg/dL (ref 65–99)
Glucose-Capillary: 139 mg/dL — ABNORMAL HIGH (ref 65–99)
Glucose-Capillary: 223 mg/dL — ABNORMAL HIGH (ref 65–99)
Glucose-Capillary: 188 mg/dL — ABNORMAL HIGH (ref 65–99)
Glucose-Capillary: 151 mg/dL — ABNORMAL HIGH (ref 65–99)

## 2015-08-10 LAB — POCT I-STAT 4, (NA,K, GLUC, HGB,HCT)
Glucose, Bld: 150 mg/dL — ABNORMAL HIGH (ref 65–99)
Glucose, Bld: 128 mg/dL — ABNORMAL HIGH (ref 65–99)
HCT: 33 % — ABNORMAL LOW (ref 36.0–46.0)
HCT: 25 % — ABNORMAL LOW (ref 36.0–46.0)
Hemoglobin: 11.2 g/dL — ABNORMAL LOW (ref 12.0–15.0)
Hemoglobin: 8.5 g/dL — ABNORMAL LOW (ref 12.0–15.0)
Potassium: 2.9 mmol/L — ABNORMAL LOW (ref 3.5–5.1)
Potassium: 4.3 mmol/L (ref 3.5–5.1)
Sodium: 142 mmol/L (ref 135–145)
Sodium: 140 mmol/L (ref 135–145)

## 2015-08-10 SURGERY — OFF PUMP CORONARY ARTERY BYPASS GRAFTING (CABG)
Anesthesia: General | Site: Chest

## 2015-08-10 MED ORDER — CETYLPYRIDINIUM CHLORIDE 0.05 % MT LIQD
7.0000 mL | Freq: Two times a day (BID) | OROMUCOSAL | Status: DC
  Administered 2015-08-10 – 2015-08-14 (×8): 7 mL via OROMUCOSAL

## 2015-08-10 MED ORDER — ASPIRIN 81 MG PO CHEW: 324.0000 mg | CHEWABLE_TABLET | Freq: Every day | ORAL | Status: DC

## 2015-08-10 MED ORDER — 0.9 % SODIUM CHLORIDE (POUR BTL) OPTIME
TOPICAL | Status: DC | PRN
  Administered 2015-08-10: 4000 mL

## 2015-08-10 MED ORDER — TRAMADOL HCL 50 MG PO TABS: 50.0000 mg | ORAL_TABLET | ORAL | Status: DC | PRN

## 2015-08-10 MED ORDER — VANCOMYCIN HCL IN DEXTROSE 1-5 GM/200ML-% IV SOLN
1000.0000 mg | Freq: Once | INTRAVENOUS | Status: AC
  Administered 2015-08-10: 1000 mg via INTRAVENOUS
  Filled 2015-08-10: qty 200

## 2015-08-10 MED ORDER — FENTANYL CITRATE (PF) 250 MCG/5ML IJ SOLN
INTRAMUSCULAR | Status: AC
  Filled 2015-08-10: qty 5

## 2015-08-10 MED ORDER — LACTATED RINGERS IV SOLN
INTRAVENOUS | Status: DC | PRN
  Administered 2015-08-10 (×3): via INTRAVENOUS

## 2015-08-10 MED ORDER — ACETAMINOPHEN 650 MG RE SUPP
650.0000 mg | Freq: Once | RECTAL | Status: AC
  Administered 2015-08-10: 650 mg via RECTAL

## 2015-08-10 MED ORDER — PROPOFOL 10 MG/ML IV BOLUS
INTRAVENOUS | Status: AC
  Filled 2015-08-10: qty 20

## 2015-08-10 MED ORDER — LACTATED RINGERS IV SOLN
500.0000 mL | Freq: Once | INTRAVENOUS | Status: AC | PRN
  Administered 2015-08-10: 500 mL via INTRAVENOUS

## 2015-08-10 MED ORDER — DEXTROSE 50 % IV SOLN
INTRAVENOUS | Status: AC
  Filled 2015-08-10: qty 50

## 2015-08-10 MED ORDER — ANTISEPTIC ORAL RINSE SOLUTION (CORINZ): 7.0000 mL | Freq: Four times a day (QID) | OROMUCOSAL | Status: DC

## 2015-08-10 MED ORDER — ROCURONIUM BROMIDE 100 MG/10ML IV SOLN
INTRAVENOUS | Status: DC | PRN
  Administered 2015-08-10: 60 mg via INTRAVENOUS
  Administered 2015-08-10: 40 mg via INTRAVENOUS

## 2015-08-10 MED ORDER — SODIUM CHLORIDE 0.9 % IJ SOLN
3.0000 mL | Freq: Two times a day (BID) | INTRAMUSCULAR | Status: DC
  Administered 2015-08-11 – 2015-08-12 (×4): 3 mL via INTRAVENOUS

## 2015-08-10 MED ORDER — DOCUSATE SODIUM 100 MG PO CAPS
200.0000 mg | ORAL_CAPSULE | Freq: Every day | ORAL | Status: DC
  Administered 2015-08-11 – 2015-08-14 (×4): 200 mg via ORAL
  Filled 2015-08-10 (×5): qty 2

## 2015-08-10 MED ORDER — ACETAMINOPHEN 500 MG PO TABS
1000.0000 mg | ORAL_TABLET | Freq: Four times a day (QID) | ORAL | Status: DC
  Administered 2015-08-11 – 2015-08-14 (×11): 1000 mg via ORAL
  Filled 2015-08-10 (×18): qty 2

## 2015-08-10 MED ORDER — DEXTROSE 50 % IV SOLN
28.0000 mL | Freq: Once | INTRAVENOUS | Status: AC
  Administered 2015-08-10: 28 mL via INTRAVENOUS
  Filled 2015-08-10: qty 50

## 2015-08-10 MED ORDER — CHLORHEXIDINE GLUCONATE CLOTH 2 % EX PADS
6.0000 | MEDICATED_PAD | Freq: Every day | CUTANEOUS | Status: AC
  Administered 2015-08-10 – 2015-08-14 (×5): 6 via TOPICAL

## 2015-08-10 MED ORDER — PHENYLEPHRINE HCL 10 MG/ML IJ SOLN
INTRAMUSCULAR | Status: DC | PRN
  Administered 2015-08-10: 40 ug via INTRAVENOUS

## 2015-08-10 MED ORDER — ACETAMINOPHEN 160 MG/5ML PO SOLN: 650.0000 mg | Freq: Once | ORAL | Status: AC

## 2015-08-10 MED ORDER — PROTAMINE SULFATE 10 MG/ML IV SOLN
INTRAVENOUS | Status: DC | PRN
  Administered 2015-08-10: 10 mg via INTRAVENOUS
  Administered 2015-08-10: 50 mg via INTRAVENOUS

## 2015-08-10 MED ORDER — BISACODYL 5 MG PO TBEC
10.0000 mg | DELAYED_RELEASE_TABLET | Freq: Every day | ORAL | Status: DC
  Administered 2015-08-11: 10 mg via ORAL
  Filled 2015-08-10 (×2): qty 2

## 2015-08-10 MED ORDER — OXYCODONE HCL 5 MG PO TABS
5.0000 mg | ORAL_TABLET | ORAL | Status: DC | PRN
  Administered 2015-08-11: 10 mg via ORAL
  Administered 2015-08-11 – 2015-08-12 (×2): 5 mg via ORAL
  Filled 2015-08-10 (×2): qty 1
  Filled 2015-08-10: qty 2

## 2015-08-10 MED ORDER — ONDANSETRON HCL 4 MG/2ML IJ SOLN
4.0000 mg | Freq: Four times a day (QID) | INTRAMUSCULAR | Status: DC | PRN
  Administered 2015-08-11: 4 mg via INTRAVENOUS
  Filled 2015-08-10 (×2): qty 2

## 2015-08-10 MED ORDER — MIDAZOLAM HCL 10 MG/2ML IJ SOLN
INTRAMUSCULAR | Status: AC
Start: 2015-08-10 — End: 2015-08-10
  Filled 2015-08-10: qty 4

## 2015-08-10 MED ORDER — HEPARIN SODIUM (PORCINE) 1000 UNIT/ML IJ SOLN
INTRAMUSCULAR | Status: AC
  Filled 2015-08-10: qty 1

## 2015-08-10 MED ORDER — METOPROLOL TARTRATE 12.5 MG HALF TABLET
12.5000 mg | ORAL_TABLET | Freq: Two times a day (BID) | ORAL | Status: DC
  Administered 2015-08-11 – 2015-08-13 (×5): 12.5 mg via ORAL
  Filled 2015-08-10 (×7): qty 1

## 2015-08-10 MED ORDER — POTASSIUM CHLORIDE 10 MEQ/50ML IV SOLN
10.0000 meq | Freq: Once | INTRAVENOUS | Status: AC
  Administered 2015-08-10: 10 meq via INTRAVENOUS

## 2015-08-10 MED ORDER — MORPHINE SULFATE (PF) 2 MG/ML IV SOLN
2.0000 mg | INTRAVENOUS | Status: DC | PRN
  Administered 2015-08-10 – 2015-08-11 (×2): 2 mg via INTRAVENOUS
  Administered 2015-08-11: 4 mg via INTRAVENOUS
  Administered 2015-08-11: 2 mg via INTRAVENOUS
  Filled 2015-08-10: qty 2
  Filled 2015-08-10 (×3): qty 1

## 2015-08-10 MED ORDER — PHENYLEPHRINE HCL 10 MG/ML IJ SOLN
10.0000 mg | INTRAVENOUS | Status: DC | PRN
  Administered 2015-08-10: 15 ug/min via INTRAVENOUS

## 2015-08-10 MED ORDER — FENTANYL CITRATE (PF) 100 MCG/2ML IJ SOLN
INTRAMUSCULAR | Status: DC | PRN
  Administered 2015-08-10: 250 ug via INTRAVENOUS
  Administered 2015-08-10: 100 ug via INTRAVENOUS
  Administered 2015-08-10: 250 ug via INTRAVENOUS
  Administered 2015-08-10: 200 ug via INTRAVENOUS
  Administered 2015-08-10: 50 ug via INTRAVENOUS
  Administered 2015-08-10: 200 ug via INTRAVENOUS
  Administered 2015-08-10: 50 ug via INTRAVENOUS
  Administered 2015-08-10: 150 ug via INTRAVENOUS

## 2015-08-10 MED ORDER — SODIUM CHLORIDE 0.9 % IJ SOLN
3.0000 mL | INTRAMUSCULAR | Status: DC | PRN
  Administered 2015-08-13: 3 mL via INTRAVENOUS
  Filled 2015-08-10: qty 3

## 2015-08-10 MED ORDER — SUCCINYLCHOLINE CHLORIDE 20 MG/ML IJ SOLN
INTRAMUSCULAR | Status: AC
  Filled 2015-08-10: qty 1

## 2015-08-10 MED ORDER — NITROGLYCERIN IN D5W 200-5 MCG/ML-% IV SOLN
0.0000 ug/min | INTRAVENOUS | Status: DC
  Administered 2015-08-10: 0 ug/min via INTRAVENOUS

## 2015-08-10 MED ORDER — ARTIFICIAL TEARS OP OINT
TOPICAL_OINTMENT | OPHTHALMIC | Status: DC | PRN
  Administered 2015-08-10: 1 via OPHTHALMIC

## 2015-08-10 MED ORDER — VECURONIUM BROMIDE 10 MG IV SOLR
INTRAVENOUS | Status: DC | PRN
  Administered 2015-08-10: 5 mg via INTRAVENOUS

## 2015-08-10 MED ORDER — SODIUM CHLORIDE 0.9 % IV SOLN
INTRAVENOUS | Status: DC
  Administered 2015-08-10: 10 mL/h via INTRAVENOUS

## 2015-08-10 MED ORDER — METOPROLOL TARTRATE 1 MG/ML IV SOLN
2.5000 mg | INTRAVENOUS | Status: DC | PRN
Start: 2015-08-10 — End: 2015-08-14
  Administered 2015-08-10 (×2): 2.5 mg via INTRAVENOUS
  Filled 2015-08-10: qty 5

## 2015-08-10 MED ORDER — DEXTROSE 50 % IV SOLN
21.0000 mL | Freq: Once | INTRAVENOUS | Status: AC
  Administered 2015-08-10: 21 mL via INTRAVENOUS

## 2015-08-10 MED ORDER — MUPIROCIN 2 % EX OINT
TOPICAL_OINTMENT | Freq: Two times a day (BID) | CUTANEOUS | Status: DC
  Administered 2015-08-10: 06:00:00 via NASAL
  Filled 2015-08-10: qty 22

## 2015-08-10 MED ORDER — METOPROLOL TARTRATE 25 MG/10 ML ORAL SUSPENSION
12.5000 mg | Freq: Two times a day (BID) | ORAL | Status: DC
  Filled 2015-08-10 (×7): qty 5

## 2015-08-10 MED ORDER — SODIUM CHLORIDE 0.9 % IJ SOLN
OROMUCOSAL | Status: DC | PRN
  Administered 2015-08-10: 4 mL via TOPICAL
  Administered 2015-08-10: 8 mL via TOPICAL

## 2015-08-10 MED ORDER — ALBUMIN HUMAN 5 % IV SOLN
250.0000 mL | INTRAVENOUS | Status: AC | PRN
  Administered 2015-08-10 (×3): 250 mL via INTRAVENOUS
  Filled 2015-08-10 (×2): qty 250

## 2015-08-10 MED ORDER — PANTOPRAZOLE SODIUM 40 MG PO TBEC
40.0000 mg | DELAYED_RELEASE_TABLET | Freq: Every day | ORAL | Status: DC
  Administered 2015-08-12 – 2015-08-14 (×3): 40 mg via ORAL
  Filled 2015-08-10 (×3): qty 1

## 2015-08-10 MED ORDER — MIDAZOLAM HCL 5 MG/5ML IJ SOLN
INTRAMUSCULAR | Status: DC | PRN
  Administered 2015-08-10: 2 mg via INTRAVENOUS
  Administered 2015-08-10: 3 mg via INTRAVENOUS
  Administered 2015-08-10: 1 mg via INTRAVENOUS

## 2015-08-10 MED ORDER — ROCURONIUM BROMIDE 50 MG/5ML IV SOLN
INTRAVENOUS | Status: AC
  Filled 2015-08-10: qty 2

## 2015-08-10 MED ORDER — DEXMEDETOMIDINE HCL IN NACL 200 MCG/50ML IV SOLN
0.0000 ug/kg/h | INTRAVENOUS | Status: DC
Start: 2015-08-10 — End: 2015-08-11
  Administered 2015-08-10: 0.7 ug/kg/h via INTRAVENOUS

## 2015-08-10 MED ORDER — HEPARIN SODIUM (PORCINE) 1000 UNIT/ML IJ SOLN
INTRAMUSCULAR | Status: DC | PRN
  Administered 2015-08-10: 6000 [IU] via INTRAVENOUS

## 2015-08-10 MED ORDER — MAGNESIUM SULFATE 4 GM/100ML IV SOLN
4.0000 g | Freq: Once | INTRAVENOUS | Status: AC
  Administered 2015-08-10: 4 g via INTRAVENOUS
  Filled 2015-08-10: qty 100

## 2015-08-10 MED ORDER — INSULIN REGULAR BOLUS VIA INFUSION
0.0000 [IU] | Freq: Three times a day (TID) | INTRAVENOUS | Status: DC
  Filled 2015-08-10: qty 10

## 2015-08-10 MED ORDER — SODIUM CHLORIDE 0.45 % IV SOLN
INTRAVENOUS | Status: DC | PRN
  Administered 2015-08-10: 20 mL/h via INTRAVENOUS

## 2015-08-10 MED ORDER — BISACODYL 10 MG RE SUPP: 10.0000 mg | Freq: Every day | RECTAL | Status: DC

## 2015-08-10 MED ORDER — MORPHINE SULFATE (PF) 2 MG/ML IV SOLN
1.0000 mg | INTRAVENOUS | Status: AC | PRN
  Administered 2015-08-10: 2 mg via INTRAVENOUS
  Filled 2015-08-10: qty 1

## 2015-08-10 MED ORDER — CHLORHEXIDINE GLUCONATE 0.12% ORAL RINSE (MEDLINE KIT)
15.0000 mL | Freq: Two times a day (BID) | OROMUCOSAL | Status: DC
  Administered 2015-08-10: 15 mL via OROMUCOSAL

## 2015-08-10 MED ORDER — FAMOTIDINE IN NACL 20-0.9 MG/50ML-% IV SOLN
20.0000 mg | Freq: Two times a day (BID) | INTRAVENOUS | Status: AC
  Administered 2015-08-10 (×2): 20 mg via INTRAVENOUS
  Filled 2015-08-10: qty 50

## 2015-08-10 MED ORDER — MUPIROCIN 2 % EX OINT
1.0000 | TOPICAL_OINTMENT | Freq: Two times a day (BID) | CUTANEOUS | Status: DC
Start: 2015-08-10 — End: 2015-08-14
  Administered 2015-08-10 – 2015-08-14 (×9): 1 via NASAL
  Filled 2015-08-10: qty 22

## 2015-08-10 MED ORDER — DEXTROSE 5 % IV SOLN
1.5000 g | Freq: Two times a day (BID) | INTRAVENOUS | Status: AC
  Administered 2015-08-10 – 2015-08-12 (×4): 1.5 g via INTRAVENOUS
  Filled 2015-08-10 (×4): qty 1.5

## 2015-08-10 MED ORDER — SODIUM CHLORIDE 0.9 % IV SOLN: 250.0000 mL | INTRAVENOUS | Status: DC

## 2015-08-10 MED ORDER — LACTATED RINGERS IV SOLN
INTRAVENOUS | Status: DC
  Administered 2015-08-10: 20 mL/h via INTRAVENOUS

## 2015-08-10 MED ORDER — POTASSIUM CHLORIDE 10 MEQ/50ML IV SOLN
10.0000 meq | INTRAVENOUS | Status: AC
  Administered 2015-08-10 (×3): 10 meq via INTRAVENOUS

## 2015-08-10 MED ORDER — PROPOFOL 10 MG/ML IV BOLUS
INTRAVENOUS | Status: DC | PRN
  Administered 2015-08-10: 60 mg via INTRAVENOUS

## 2015-08-10 MED ORDER — MIDAZOLAM HCL 2 MG/2ML IJ SOLN: 2.0000 mg | INTRAMUSCULAR | Status: DC | PRN

## 2015-08-10 MED ORDER — DEXTROSE 5 % IV SOLN
0.0000 ug/min | INTRAVENOUS | Status: DC
  Administered 2015-08-10: 0 ug/min via INTRAVENOUS
  Filled 2015-08-10: qty 2

## 2015-08-10 MED ORDER — ASPIRIN EC 325 MG PO TBEC
325.0000 mg | DELAYED_RELEASE_TABLET | Freq: Every day | ORAL | Status: DC
Start: 2015-08-11 — End: 2015-08-14
  Administered 2015-08-11 – 2015-08-14 (×4): 325 mg via ORAL
  Filled 2015-08-10 (×4): qty 1

## 2015-08-10 MED ORDER — VECURONIUM BROMIDE 10 MG IV SOLR
INTRAVENOUS | Status: AC
  Filled 2015-08-10: qty 10

## 2015-08-10 MED ORDER — GLYCOPYRROLATE 0.2 MG/ML IJ SOLN
INTRAMUSCULAR | Status: DC | PRN
  Administered 2015-08-10: .2 mg via INTRAVENOUS

## 2015-08-10 MED ORDER — LACTATED RINGERS IV SOLN
INTRAVENOUS | Status: DC
Start: 2015-08-10 — End: 2015-08-11
  Administered 2015-08-10: 10 mL/h via INTRAVENOUS

## 2015-08-10 MED ORDER — EPHEDRINE SULFATE 50 MG/ML IJ SOLN
INTRAMUSCULAR | Status: AC
Start: 2015-08-10 — End: 2015-08-10
  Filled 2015-08-10: qty 1

## 2015-08-10 MED ORDER — STERILE WATER FOR IRRIGATION IR SOLN
Status: DC | PRN
  Administered 2015-08-10: 2000 mL

## 2015-08-10 MED ORDER — SODIUM CHLORIDE 0.9 % IV SOLN
INTRAVENOUS | Status: DC
  Administered 2015-08-10: 5.7 [IU]/h via INTRAVENOUS
  Filled 2015-08-10 (×2): qty 2.5

## 2015-08-10 MED ORDER — ACETAMINOPHEN 160 MG/5ML PO SOLN: 1000.0000 mg | Freq: Four times a day (QID) | ORAL | Status: DC

## 2015-08-10 MED FILL — Magnesium Sulfate Inj 50%: INTRAMUSCULAR | Qty: 10 | Status: AC

## 2015-08-10 MED FILL — Potassium Chloride Inj 2 mEq/ML: INTRAVENOUS | Qty: 40 | Status: AC

## 2015-08-10 MED FILL — Heparin Sodium (Porcine) Inj 1000 Unit/ML: INTRAMUSCULAR | Qty: 30 | Status: AC

## 2015-08-10 SURGICAL SUPPLY — 120 items
ADAPTER CARDIO PERF ANTE/RETRO (ADAPTER) IMPLANT
APPLIER CLIP 9.375 MED OPEN (MISCELLANEOUS)
APPLIER CLIP 9.375 SM OPEN (CLIP)
BAG DECANTER FOR FLEXI CONT (MISCELLANEOUS) ×3 IMPLANT
BANDAGE ELASTIC 4 VELCRO ST LF (GAUZE/BANDAGES/DRESSINGS) ×3 IMPLANT
BANDAGE ELASTIC 6 VELCRO ST LF (GAUZE/BANDAGES/DRESSINGS) ×3 IMPLANT
BASKET HEART (ORDER IN 25'S) (MISCELLANEOUS)
BASKET HEART (ORDER IN 25S) (MISCELLANEOUS) IMPLANT
BENZOIN TINCTURE PRP APPL 2/3 (GAUZE/BANDAGES/DRESSINGS) ×3 IMPLANT
BLADE STERNUM SYSTEM 6 (BLADE) ×3 IMPLANT
BLADE SURG 11 STRL SS (BLADE) IMPLANT
BLADE SURG ROTATE 9660 (MISCELLANEOUS) IMPLANT
BLOWER MISTER CAL-MED (MISCELLANEOUS) ×3 IMPLANT
BNDG GAUZE ELAST 4 BULKY (GAUZE/BANDAGES/DRESSINGS) ×3 IMPLANT
CANISTER SUCTION 2500CC (MISCELLANEOUS) ×3 IMPLANT
CANNULA GUNDRY RCSP 15FR (MISCELLANEOUS) IMPLANT
CATH ROBINSON RED A/P 18FR (CATHETERS) IMPLANT
CATH THORACIC 36FR RT ANG (CATHETERS) IMPLANT
CLIP APPLIE 9.375 MED OPEN (MISCELLANEOUS) IMPLANT
CLIP APPLIE 9.375 SM OPEN (CLIP) IMPLANT
CLIP FOGARTY SPRING 6M (CLIP) IMPLANT
CLIP RETRACTION 3.0MM CORONARY (MISCELLANEOUS) IMPLANT
CLIP TI MEDIUM 24 (CLIP) IMPLANT
CLIP TI WIDE RED SMALL 24 (CLIP) IMPLANT
CONN 3/8X3/8 GISH STERILE (MISCELLANEOUS) ×3 IMPLANT
CONN Y 3/8X3/8X3/8  BEN (MISCELLANEOUS)
CONN Y 3/8X3/8X3/8 BEN (MISCELLANEOUS) IMPLANT
CRADLE DONUT ADULT HEAD (MISCELLANEOUS) ×3 IMPLANT
DRAPE CARDIOVASCULAR INCISE (DRAPES) ×1
DRAPE SLUSH/WARMER DISC (DRAPES) IMPLANT
DRAPE SRG 135X102X78XABS (DRAPES) ×2 IMPLANT
DRSG AQUACEL AG ADV 3.5X14 (GAUZE/BANDAGES/DRESSINGS) ×3 IMPLANT
DRSG COVADERM 4X14 (GAUZE/BANDAGES/DRESSINGS) ×3 IMPLANT
ELECT CAUTERY BLADE 6.4 (BLADE) IMPLANT
ELECT REM PT RETURN 9FT ADLT (ELECTROSURGICAL) ×6
ELECTRODE REM PT RTRN 9FT ADLT (ELECTROSURGICAL) ×4 IMPLANT
GAUZE SPONGE 4X4 12PLY STRL (GAUZE/BANDAGES/DRESSINGS) ×6 IMPLANT
GLOVE BIO SURGEON STRL SZ7 (GLOVE) ×3 IMPLANT
GLOVE BIO SURGEON STRL SZ8.5 (GLOVE) ×6 IMPLANT
GLOVE BIOGEL PI IND STRL 6 (GLOVE) ×6 IMPLANT
GLOVE BIOGEL PI IND STRL 6.5 (GLOVE) ×6 IMPLANT
GLOVE BIOGEL PI INDICATOR 6 (GLOVE) ×3
GLOVE BIOGEL PI INDICATOR 6.5 (GLOVE) ×3
GLOVE EUDERMIC 7 POWDERFREE (GLOVE) IMPLANT
GOWN STRL REUS W/ TWL LRG LVL3 (GOWN DISPOSABLE) ×8 IMPLANT
GOWN STRL REUS W/TWL LRG LVL3 (GOWN DISPOSABLE) ×4
HEMOSTAT POWDER SURGIFOAM 1G (HEMOSTASIS) IMPLANT
HEMOSTAT SURGICEL 2X14 (HEMOSTASIS) IMPLANT
INSERT FOGARTY XLG (MISCELLANEOUS) IMPLANT
KIT BASIN OR (CUSTOM PROCEDURE TRAY) ×3 IMPLANT
KIT ROOM TURNOVER OR (KITS) ×3 IMPLANT
KIT SUCTION CATH 14FR (SUCTIONS) IMPLANT
KIT VASOVIEW W/TROCAR VH 2000 (KITS) ×3 IMPLANT
LINE VENT (MISCELLANEOUS) IMPLANT
MARKER GRAFT CORONARY BYPASS (MISCELLANEOUS) IMPLANT
MARKER SKIN DUAL TIP RULER LAB (MISCELLANEOUS) ×3 IMPLANT
NS IRRIG 1000ML POUR BTL (IV SOLUTION) ×12 IMPLANT
PACK OPEN HEART (CUSTOM PROCEDURE TRAY) ×3 IMPLANT
PAD ARMBOARD 7.5X6 YLW CONV (MISCELLANEOUS) ×6 IMPLANT
PEDIATRIC SUCKERS (MISCELLANEOUS) IMPLANT
PENCIL BUTTON HOLSTER BLD 10FT (ELECTRODE) IMPLANT
PUNCH AORTIC ROTATE 4.0MM (MISCELLANEOUS) IMPLANT
PUNCH AORTIC ROTATE 4.5MM 8IN (MISCELLANEOUS) IMPLANT
PUNCH AORTIC ROTATE 5MM 8IN (MISCELLANEOUS) IMPLANT
SET CARDIOPLEGIA MPS 5001102 (MISCELLANEOUS) IMPLANT
SHUNT FLO COIL 2.00MM (MISCELLANEOUS) ×3 IMPLANT
SOLUTION ANTI FOG 6CC (MISCELLANEOUS) IMPLANT
SPONGE GAUZE 4X4 12PLY STER LF (GAUZE/BANDAGES/DRESSINGS) ×3 IMPLANT
STABILIZER COHN CARD 89 9340 (MISCELLANEOUS) IMPLANT
STOPCOCK 4 WAY LG BORE MALE ST (IV SETS) IMPLANT
SUT BONE WAX W31G (SUTURE) IMPLANT
SUT ETHIBOND 2 0 SH (SUTURE) ×4
SUT ETHIBOND 2 0 SH 36X2 (SUTURE) ×8 IMPLANT
SUT MNCRL AB 4-0 PS2 18 (SUTURE) IMPLANT
SUT PROLENE 3 0 SH DA (SUTURE) IMPLANT
SUT PROLENE 4 0 RB 1 (SUTURE) ×1
SUT PROLENE 4 0 SH DA (SUTURE) IMPLANT
SUT PROLENE 4-0 RB1 .5 CRCL 36 (SUTURE) ×2 IMPLANT
SUT PROLENE 5 0 C 1 36 (SUTURE) ×6 IMPLANT
SUT PROLENE 6 0 C 1 30 (SUTURE) ×9 IMPLANT
SUT PROLENE 7 0 BV 1 (SUTURE) ×6 IMPLANT
SUT PROLENE 8 0 BV175 6 (SUTURE) ×6 IMPLANT
SUT SILK  1 MH (SUTURE) ×3
SUT SILK 1 MH (SUTURE) ×6 IMPLANT
SUT SILK 1 SH (SUTURE) IMPLANT
SUT SILK 1 TIES 10X30 (SUTURE) ×6 IMPLANT
SUT SILK 2 0 SH CR/8 (SUTURE) ×6 IMPLANT
SUT SILK 2 0 TIES 10X30 (SUTURE) ×3 IMPLANT
SUT SILK 2 0 TIES 17X18 (SUTURE) ×1
SUT SILK 2-0 18XBRD TIE BLK (SUTURE) ×2 IMPLANT
SUT SILK 3 0 SH CR/8 (SUTURE) ×3 IMPLANT
SUT SILK 4 0 TIE 10X30 (SUTURE) ×6 IMPLANT
SUT STEEL 6MS V (SUTURE) IMPLANT
SUT STEEL STERNAL CCS#1 18IN (SUTURE) IMPLANT
SUT STEEL SZ 6 DBL 3X14 BALL (SUTURE) IMPLANT
SUT TEM PAC WIRE 2 0 SH (SUTURE) ×12 IMPLANT
SUT VIC AB 1 CTX 36 (SUTURE)
SUT VIC AB 1 CTX36XBRD ANBCTR (SUTURE) IMPLANT
SUT VIC AB 2-0 CT1 27 (SUTURE)
SUT VIC AB 2-0 CT1 TAPERPNT 27 (SUTURE) IMPLANT
SUT VIC AB 2-0 CTX 27 (SUTURE) IMPLANT
SUT VIC AB 3-0 SH 27 (SUTURE)
SUT VIC AB 3-0 SH 27X BRD (SUTURE) IMPLANT
SUT VIC AB 3-0 X1 27 (SUTURE) IMPLANT
SUT VICRYL 2 0 J607H (SUTURE) ×6 IMPLANT
SUT VICRYL 3 0 (SUTURE) ×6 IMPLANT
SUT VICRYL 4-0 PS2 18IN ABS (SUTURE) IMPLANT
SUTURE E-PAK OPEN HEART (SUTURE) ×3 IMPLANT
SYS GUIDANT ACHIEVE OFF PUMP (MISCELLANEOUS) ×3 IMPLANT
SYSTEM SAHARA CHEST DRAIN ATS (WOUND CARE) ×3 IMPLANT
TAPE CLOTH SURG 4X10 WHT LF (GAUZE/BANDAGES/DRESSINGS) ×3 IMPLANT
TAPES RETRACTO (MISCELLANEOUS) ×3 IMPLANT
TOWEL OR 17X24 6PK STRL BLUE (TOWEL DISPOSABLE) ×3 IMPLANT
TOWEL OR 17X26 10 PK STRL BLUE (TOWEL DISPOSABLE) ×3 IMPLANT
TRAY FOLEY IC TEMP SENS 16FR (CATHETERS) ×3 IMPLANT
TUBING INSUFFLATION (TUBING) ×3 IMPLANT
TUNNELER SHEATH ON-Q 11GX8 DSP (PAIN MANAGEMENT) ×3 IMPLANT
UNDERPAD 30X30 INCONTINENT (UNDERPADS AND DIAPERS) ×3 IMPLANT
WATER STERILE IRR 1000ML POUR (IV SOLUTION) ×6 IMPLANT
XPOSE 4 ACCESS ×3 IMPLANT

## 2015-08-10 NOTE — Progress Notes (Signed)
*  PRELIMINARY RESULTS* Echocardiogram Echocardiogram Transesophageal has been performed.  Jeryl Columbia 08/10/2015, 8:36 AM

## 2015-08-10 NOTE — Brief Op Note (Addendum)
08/08/2015 - 08/10/2015  9:43 AM  PATIENT:  Kelly Castillo  81 y.o. female  PRE-OPERATIVE DIAGNOSIS:  Single vessel CAD  POST-OPERATIVE DIAGNOSIS: Single vessel  CAD  PROCEDURE:   OFF PUMP CORONARY ARTERY BYPASS GRAFTING x 1 (LIMA-LAD)  SURGEON:  Loreli Slot, MD  ASSISTANT: Coral Ceo, PA-C  ANESTHESIA:   general  PATIENT CONDITION:  ICU - intubated and hemodynamically stable.  PRE-OPERATIVE WEIGHT: 47.9 kg   LIMA and LAD both good quality vessels Transient ST elevation with reperfusion

## 2015-08-10 NOTE — Procedures (Signed)
Extubation Procedure Note  Patient Details:   Name: Kelly Castillo DOB: 1934/10/31 MRN: [id number]   Airway Documentation:     Evaluation  O2 sats: stable throughout and currently acceptable Complications: No apparent complications Patient did tolerate procedure well. Bilateral Breath Sounds: Clear, Diminished   Yes   Prior to extubation:  NIF -20, VC 850, positive cuff leak noted.  Dr. Dorris Fetch called with ABG results and OK'd extubation per Saunders Glance, RN.  Pt suctioned orally and via ETT.  Post-extubation:  Pt able to state her name, cough to produce sputum, no stridor noted.  Antoine Poche 08/10/2015, 3:52 PM

## 2015-08-10 NOTE — Interval H&P Note (Signed)
History and Physical Interval Note:  08/10/2015 7:23 AM  Kelly Castillo  has presented today for surgery, with the diagnosis of CAD  The various methods of treatment have been discussed with the patient and family. After consideration of risks, benefits and other options for treatment, the patient has consented to  Procedure(s): OFF PUMP CORONARY ARTERY BYPASS GRAFTING (CABG) (N/A) TRANSESOPHAGEAL ECHOCARDIOGRAM (TEE) (N/A) as a surgical intervention .  The patient's history has been reviewed, patient examined, no change in status, stable for surgery.  I have reviewed the patient's chart and labs.  Questions were answered to the patient's satisfaction.     Loreli Slot

## 2015-08-10 NOTE — Progress Notes (Signed)
Hypoglycemic Event  CBG: 30  Treatment: 32ml of D50 per glucostabilizer  Symptoms: None  Follow-up CBG: Time:1345 CBG Result:176  Possible Reasons for Event: Unknown  Comments/MD notified:Dr. Quillian Quince  Remember to initiate Hypoglycemia Order Set & complete

## 2015-08-10 NOTE — Op Note (Signed)
NAMETERRESSA, HUTZELL NO.:  [id number]  MEDICAL RECORD NO.:  [id number]  LOCATION:  2S05C                        FACILITY:  [facility]  PHYSICIAN:  Salvatore Decent. Dorris Fetch, M.D.DATE OF BIRTH:  Mar 18, 1934  DATE OF PROCEDURE:  08/10/2015 DATE OF DISCHARGE:                              OPERATIVE REPORT   PREOPERATIVE DIAGNOSIS:  Severe single-vessel coronary disease with unstable angina.  POSTOPERATIVE DIAGNOSIS:  Severe single-vessel coronary disease with unstable angina.  PROCEDURE:   Median sternotomy Off pump coronary artery bypass grafting x1  Left internal mammary artery to left anterior descending  SURGEON:  Salvatore Decent. Dorris Fetch, M.D.  ASSISTANT:  Coral Ceo, PA  ANESTHESIA:  General.  FINDINGS:  Mild anterior wall hypokinesis on transesophageal echocardiography.  Mild mitral regurgitation.  Mammary artery and LAD both 2 mm good quality vessels, transient ST-elevation after reperfusion.  CLINICAL NOTE:  Ms. Rosenau is a 81 year old woman with known mild-to- moderate mitral regurgitation who presented with unstable angina.  She had a positive Lexiscan Myoview.  At catheterization, she had critical ostial LAD stenosis with no other significant coronary stenoses.  She was advised to undergo coronary bypass grafting.  The indications, risks, benefits, and alternatives were discussed in detail with the patient, she understood and accepted the risks and agreed to proceed.  OPERATIVE NOTE:  Ms. Meservey was brought to the preoperative holding area on August 10, 2015.  Anesthesia placed a Swan-Ganz catheter and arterial blood pressure monitoring line.  Intravenous antibiotics were administered.  She was taken to the operating room, anesthetized, and intubated.  A Foley catheter was placed.  Transesophageal echocardiography was performed.  There was mild MR and mild anterior wall hypokinesis.  The chest, abdomen, and legs were prepped and draped in the  usual sterile fashion.  A median sternotomy was performed.  The left internal mammary artery was harvested using standard technique.  6000 units of heparin was administered before dividing the distal end of the mammary.  There was excellent flow through the cut end of the vessel.  The retractor was placed.  The pericardium was opened.  The heart was elevated and the LAD was a good quality target as expected based on catheterization findings.  The Guidant off-pump grafting system was used.  The patient was placed in Trendelenburg position.  Suction was applied to the apex of the heart and it was brought anteriorly and to the right side.  The patient had some transient hypotension with positioning of the heart, but recovered quickly.  The left internal mammary artery was brought through a window in the pericardium.  The distal end was beveled.  There was excellent flow through the mammary artery.  The stabilizer then was placed at the site selected for anastomosis in the mid LAD.  Silastic tapes were applied proximally and distally.  There were no ischemic changes when the silastic tapes were tightened.  An arteriotomy was made.  The LAD was a 2 mm good quality target vessel. The left mammary to LAD anastomosis then was performed end-to-side with a running 8-0 Prolene suture.  At the completion of anastomosis, the distal silastic tape was released.  A probe passed easily distally into the vessel.  The  suture was tied and the proximal silastic tape and the bulldog clamp on the mammary artery were removed.  The mammary pedicle was tacked to the epicardial surface of the heart with 6-0 Prolene sutures.  After inspecting the anastomosis for hemostasis, the heart was then allowed to rest back in its normal position in the pericardium.  At this point, it was noted that the patient had some ST elevation, which appeared most prominent in lead II.  This resolved over the next several minutes.  The patient  remained hemodynamically stable throughout the entire procedure.  The chest was copiously irrigated with warm saline. Hemostasis was achieved.  Epicardial pacing wires were placed on the right ventricle and right atrium.  The pericardium was not closed.  A left pleural and mediastinal chest tubes were placed through separate subcostal incisions.  The sternum was closed with a combination of single and double heavy gauge stainless steel wires.  The patient tolerated the sternal closure well without any EKG or hemodynamic changes.  The pectoralis fascia, subcutaneous tissue, and skin were closed in a standard fashion.  All sponge, needle, and instrument counts were correct at the end of the procedure.  The patient was taken from the operating room to the Surgical Intensive Care Unit intubated and in good condition.     Salvatore Decent Dorris Fetch, M.D.     SCH/MEDQ  D:  08/10/2015  T:  08/10/2015  Job:  [id number]

## 2015-08-10 NOTE — Plan of Care (Signed)
Problem: Phase I - Pre-Op Goal: Point person for discharge identified Outcome: Progressing Daughter [name] [phone]  Problem: Phase II - Intermediate Post-Op Goal: Maintain Hemodynamic Stability Outcome: Progressing On and off neo and nitro. Required volume Goal: Activity Progressed Outcome: Progressing Dangle x 1

## 2015-08-10 NOTE — Anesthesia Procedure Notes (Signed)
Procedure Name: Intubation Date/Time: 08/10/2015 7:50 AM Performed by: Marena Chancy Pre-anesthesia Checklist: Patient identified, Emergency Drugs available, Suction available, Patient being monitored and Timeout performed Patient Re-evaluated:Patient Re-evaluated prior to inductionOxygen Delivery Method: Circle system utilized Preoxygenation: Pre-oxygenation with 100% oxygen Intubation Type: IV induction Ventilation: Oral airway inserted - appropriate to patient size Laryngoscope Size: Hyacinth Meeker and 2 Grade View: Grade I Tube type: Oral Tube size: 8.0 mm Number of attempts: 1 Airway Equipment and Method: Stylet Placement Confirmation: ETT inserted through vocal cords under direct vision,  positive ETCO2 and breath sounds checked- equal and bilateral Secured at: 20 cm Dental Injury: Teeth and Oropharynx as per pre-operative assessment  Comments: Intubation by Lyndee Leo, SRNA

## 2015-08-10 NOTE — Anesthesia Preprocedure Evaluation (Addendum)
Anesthesia Evaluation  Patient identified by MRN, date of birth, ID band Patient awake    Reviewed: Allergy & Precautions, NPO status , Patient's Chart, lab work & pertinent test results  Airway Mallampati: II  TM Distance: >3 FB Neck ROM: Full    Dental   Pulmonary    breath sounds clear to auscultation       Cardiovascular hypertension,  Rhythm:Regular Rate:Normal     Neuro/Psych    GI/Hepatic   Endo/Other  diabetes  Renal/GU      Musculoskeletal   Abdominal   Peds  Hematology   Anesthesia Other Findings   Reproductive/Obstetrics                            Anesthesia Physical Anesthesia Plan  ASA: III  Anesthesia Plan: General   Post-op Pain Management:    Induction: Intravenous  Airway Management Planned: Oral ETT  Additional Equipment: Arterial line, PA Cath, CVP and 3D TEE  Intra-op Plan:   Post-operative Plan:   Informed Consent: I have reviewed the patients History and Physical, chart, labs and discussed the procedure including the risks, benefits and alternatives for the proposed anesthesia with the patient or authorized representative who has indicated his/her understanding and acceptance.   Dental advisory given  Plan Discussed with: CRNA and Anesthesiologist  Anesthesia Plan Comments:         Anesthesia Quick Evaluation

## 2015-08-10 NOTE — H&P (View-Only) (Signed)
Reason for Consult:Single vessel CAD with unstable angina Referring Physician: Dr. Crenshaw/ Smith  Kelly Castillo is an 81 y.o. female.  HPI: Kelly Castillo is a 81 yo woman with a history of mitral regurgitation, hypertension and non-obstructive CAD. She now presents with a cc/o of substernal chest pressure with exertion. This pain has been occuring for the past couple of months. She now is at the point where she can experience CP even with changing position in bed. The pain typically lasts a few minutes and is relieved by rest. She has had episodes when she feels stressed as well. There is no radiation. She denies diaphoresis. She does c/o nausea but not always associated with CP.  Her cardiac history dates to 2004. She had a cath which showed non-obstructive CAD and 3+ MR. EF was 41%. A more recent echo showed an EF of 50% and mild MR. Her most recent cath was in March 2015. There was no hemodynamically significant CAD. EF was 55% and MR was mild.  She had a lexiscan myoview which demonstrated anterior ischemia. An echo on 9/7 showed moderate MR. Today she had cardiac catheterization which showed a tight ostial LAD stenosis. There was a 35% lesion in the circumflex.  Past Medical History  Diagnosis Date  . Non-ischemic cardiomyopathy   . GERD (gastroesophageal reflux disease)   . LBBB (left bundle branch block)   . HTN (hypertension)   . Anemia   . Lumbar disc disease   . Hypercholesterolemia   . Type II diabetes mellitus   . Poor short term memory     Past Surgical History  Procedure Laterality Date  . Orif ankle fracture Right 1980's  . Cataract extraction w/phaco  07/24/2011    Procedure: CATARACT EXTRACTION PHACO AND INTRAOCULAR LENS PLACEMENT (IOC);  Surgeon: Kerry Hunt;  Location: [facility];  Service: Ophthalmology;  Laterality: Left;  CDE: 14.73  . Left heart catheterization with coronary angiogram N/A 01/31/2014    Procedure: LEFT HEART CATHETERIZATION WITH CORONARY ANGIOGRAM;   Surgeon: Peter M Jordan, MD;  Location: [facility] CATH [facility];  Service: Cardiovascular;  Laterality: N/A;  . Fracture surgery    . Cardiac catheterization N/A 08/08/2015    Procedure: Left Heart Cath and Coronary Angiography;  Surgeon: Henry W Smith, MD;  Location: [facility] INVASIVE CV LAB;  Service: Cardiovascular;  Laterality: N/A;  . Cardiac catheterization  01/31/2014  . [place]ectomy  1970's    [place]  . Vaginal hysterectomy  1980's  . Dilation and curettage of uterus    . Tubal ligation    . Cataract extraction w/ intraocular lens implant Right   . Eye surgery Bilateral     "laser; related to diabetes"    Family History  Problem Relation Age of Onset  . Anesthesia problems Neg Hx   . Hypotension Neg Hx   . Malignant hyperthermia Neg Hx   . Pseudochol deficiency Neg Hx     Social History:  reports that she has never smoked. She has never used smokeless tobacco. She reports that she does not drink alcohol or use illicit drugs.  Allergies: No Known Allergies  Medications:  Prior to Admission:  Prescriptions prior to admission  Medication Sig Dispense Refill Last Dose  . aspirin EC 81 MG tablet Take 1 tablet (81 mg total) by mouth daily. 90 tablet 3 08/08/2015 at 0600  . aspirin-sod bicarb-citric acid (ALKA-SELTZER) 325 MG TBEF tablet Take 325 mg by mouth every 6 (six) hours as needed (stomach issues).     .   atorvastatin (LIPITOR) 20 MG tablet Take 1 tablet (20 mg total) by mouth daily at 6 PM. 30 tablet 5 08/07/2015 at 1000  . glimepiride (AMARYL) 4 MG tablet Take 4 mg by mouth 2 (two) times daily.     08/07/2015 at 1000  . hydrochlorothiazide (MICROZIDE) 12.5 MG capsule Take 12.5 mg by mouth daily.   08/07/2015 at 1800  . iron polysaccharides (FERREX 150) 150 MG capsule Take 150 mg by mouth daily.     08/07/2015 at 1800  . lisinopril (PRINIVIL,ZESTRIL) 40 MG tablet Take 40 mg by mouth daily.   08/07/2015 at 1000  . metFORMIN (GLUCOPHAGE) 500 MG tablet Take 500 mg by mouth daily.   08/07/2015  at 1000  . metoprolol tartrate (LOPRESSOR) 25 MG tablet Take 1 tablet (25 mg total) by mouth 2 (two) times daily. 60 tablet 6 Unknown at Unknown time  . nitroGLYCERIN (NITROSTAT) 0.4 MG SL tablet Place 1 tablet (0.4 mg total) under the tongue every 5 (five) minutes as needed for chest pain. 25 tablet 6 Unknown at Unknown time    Results for orders placed or performed during the hospital encounter of 08/08/15 (from the past 48 hour(s))  Glucose, capillary     Status: Abnormal   Collection Time: 08/08/15  7:50 AM  Result Value Ref Range   Glucose-Capillary 172 (H) 65 - 99 mg/dL  Glucose, capillary     Status: Abnormal   Collection Time: 08/08/15  1:46 PM  Result Value Ref Range   Glucose-Capillary 141 (H) 65 - 99 mg/dL  Glucose, capillary     Status: Abnormal   Collection Time: 08/08/15  5:18 PM  Result Value Ref Range   Glucose-Capillary 155 (H) 65 - 99 mg/dL    Dg Chest 2 View  08/07/2015   CLINICAL DATA:  Mitral valve insufficiency.  EXAM: CHEST  2 VIEW  COMPARISON:  None.  FINDINGS: Mediastinum and hilar structures normal. Lungs are clear. Heart size normal. No pleural effusion or pneumothorax. No acute bony abnormality identified.  IMPRESSION: No acute cardiopulmonary disease.   Electronically Signed   By: Thomas  Register   On: 08/07/2015 12:25    Review of Systems  Constitutional: Positive for malaise/fatigue. Negative for fever and chills.  Respiratory: Positive for shortness of breath. Negative for cough and wheezing.   Cardiovascular: Positive for chest pain. Negative for palpitations, orthopnea, claudication, leg swelling and PND.  Gastrointestinal: Positive for nausea. Negative for vomiting and abdominal pain.  Genitourinary: Negative for dysuria and urgency.  Musculoskeletal: Positive for joint pain. Negative for myalgias.  Neurological: Negative for dizziness, speech change, focal weakness and loss of consciousness.       Memory loss per patient's daughter  All other  systems reviewed and are negative.  Blood pressure 173/43, pulse 66, temperature 97.8 F (36.6 C), temperature source Oral, resp. rate 20, height 5' (1.524 m), weight 102 lb (46.267 kg), SpO2 100 %. Physical Exam  Vitals reviewed. Constitutional: She is oriented to person, place, and time. She appears well-developed and well-nourished. No distress.  HENT:  Head: Normocephalic and atraumatic.  Mouth/Throat: No oropharyngeal exudate.  Eyes: Conjunctivae and EOM are normal. No scleral icterus.  Neck: Normal range of motion. Neck supple. No thyromegaly present.  No carotid bruit  Cardiovascular: Normal rate, regular rhythm and intact distal pulses.  Exam reveals no gallop and no friction rub.   Murmur (faint systolic) heard. Respiratory: Effort normal and breath sounds normal. She has no wheezes. She has no rales.  GI:   Soft. Bowel sounds are normal. She exhibits no distension. There is no tenderness.  Musculoskeletal: She exhibits no edema.  Lymphadenopathy:    She has no cervical adenopathy.  Neurological: She is alert and oriented to person, place, and time. No cranial nerve deficit.  Motor 5/5 bilaterally  Skin: Skin is warm and dry.  Psychiatric: She has a normal mood and affect.      ECHOCARDIOGRAM 07/25/2015 Study Conclusions  - Left ventricle: The cavity size was normal. Wall thickness was normal. Systolic function was mildly reduced. The estimated ejection fraction was in the range of 45% to 50%. There is akinesis of the basal-midanteroseptal myocardium. Doppler parameters are consistent with abnormal left ventricular relaxation (grade 1 diastolic dysfunction). - Aortic valve: There was trivial regurgitation. - Mitral valve: There was moderate regurgitation. - Left atrium: The atrium was mildly dilated.  Impressions:  - When compared to prior echocardiogram, anteroseptal wall motion abnormality is currently more pronounced.  CARDIAC  CATHETERIZATION Conclusion    1. Ost LAD lesion, 90% stenosed. 2. Prox Cx to Mid Cx lesion, 35% stenosed.   Severe ostial LAD in a 81-year-old diabetic.  Otherwise widely patent coronaries with less than 50% mid circumflex obstruction.  Overall normal left ventricular function.   Recommendations:   Treatment options include all pump LIMA to LAD versus LAD ostial stent. Given her diabetic status and otherwise relatively good health, LIMA should be considered. We will be prepared to do ostial LAD stent tomorrow if she is deemed non-surgical.     Assessment/Plan: 81 yo woman with severe single vessel, ostial LAD, CAD with unstable angina. She needs revascularization.  I agree with Dr. Smith that a LIMA to LAD is the best option for revascularization. This can be done off pump. She did have moderate MR on her most recent echo, but has had some degree of MR for years. Will d/w Dr. Crenshaw whether he feels there is a need for mitral repair at the time of surgery but my bias would be to do OPCAB and leave the mitral alone given she has no heart failure symptoms.  I discussed the general nature of the procedure, the need for general anesthesia, and the incisions to be used with Kelly Castillo and her son and daughter. We discussed the expected hospital stay, overall recovery and short and long term outcomes. I reviewed the indications, risks, benefits and alternatives. They understand the risks include, but are not limited to death, stroke, MI, DVT/PE, bleeding, possible need for transfusion, infections, cardiac arrhythmias, and other organ system dysfunction including respiratory, renal, or GI complications. She accepts the risks and agrees to proceed.  Plan OPCAB Friday 9/23 pending review of the echocardiogram.  Steven C Hendrickson 08/08/2015, 6:24 PM      

## 2015-08-10 NOTE — Anesthesia Postprocedure Evaluation (Signed)
  Anesthesia Post-op Note  Patient: Kelly Castillo  Procedure(s) Performed: Procedure(s): OFF PUMP CORONARY ARTERY BYPASS GRAFTING (CABG) UTILIZING THE LEFT INTERNAL MAMMARY ARTERY (N/A) TRANSESOPHAGEAL ECHOCARDIOGRAM (TEE) (N/A)  Patient Location: PACU  Anesthesia Type:General  Level of Consciousness: sedated and Patient remains intubated per anesthesia plan  Airway and Oxygen Therapy: Patient remains intubated per anesthesia plan and Patient placed on Ventilator (see vital sign flow sheet for setting)  Post-op Pain: none  Post-op Assessment: Post-op Vital signs reviewed, Patient's Cardiovascular Status Stable, Respiratory Function Stable, Patent Airway and Pain level controlled              Post-op Vital Signs: stable  Last Vitals:  Filed Vitals:   08/10/15 1415  BP:   Pulse: 70  Temp: 36.9 C  Resp: 12    Complications: No apparent anesthesia complications

## 2015-08-10 NOTE — Progress Notes (Signed)
CT surgery p.m. Rounds  Sitting up in bed and extubated after off-pump CABG Stable hemodynamics Breathing comfortably good oxygen saturation Minimal chest tube drainage P.m. labs reviewed and are satisfactory

## 2015-08-10 NOTE — Addendum Note (Signed)
Addendum  created 08/10/15 1549 by Kipp Brood, MD   Modules edited: Notes Section   Notes Section:  File: [id number]; Pend: [id number]; Pend: [id number]; Pend: [id number]

## 2015-08-10 NOTE — OR Nursing (Signed)
SICU Calls; 1st call @ [phone]; 2nd call @ [phone]; 3rd call @ 1007; and final call @ 1032.     Frances Maywood Demeka Sutter,RN

## 2015-08-10 NOTE — CV Procedure (Signed)
Intra-operative Transesophageal Echocardiography Report:  Kelly Castillo is a 81 year old female with long-standing history of mitral regurgitation. She recently developed chest pain and underwent a myocardial perfusion study which demonstrated anterior wall ischemia. A cardiac catheterization revealed ostial LAD occlusion. She is now scheduled to undergo coronary artery bypass grafting with a LIMA to the LAD by Dr. Dorris Fetch done off pump. Intraoperative transesophageal echocardiography was indicated to evaluate the mitral valve and to assess for any other valvular pathology, to assess the right and left ventricular function' and to serve as a monitor for intraoperative volume status and cardiac function. The patient was brought to the operating room at [facility] and general anesthesia was induced without difficulty. Following endotracheal intubation and orogastric suctioning, the echocardiography probe was inserted in the esophagus without difficulty.   1. Aortic valve: The aortic valve was trileaflet. The leaflets were mildly thickened but opened normally without restriction. There was trace aortic insufficiency.   2. Mitral valve: The mitral leaflets opened normally without restriction. The leaflets showed no prolapsing or flail segments. There was a central jet of mitral regurgitation which was graded as 1+.  3. Left ventricle: The left ventricular size was within normal limits. Left ventricular end-diastolic diameter measured 4.58 cm at the mid-papillary level in the transgastric short axis view. Left ventricular end-systolic diameter was 3.65 cm. There was mild global left ventricular hypokinesis and the ejection fraction was estimated at 50-55%. There were no anterior wall regional wall motion abnormalities that could be appreciated. There was no thinning of the anterior wall or akinetic segments appreciated.  4. Right ventricle: The right ventricular size was within normal limits.  There was normal appearing right ventricular function.  5. Tricuspid valve: The tricuspid valve was structurally normal with trace tricuspid insufficiency.  6. Interatrial septum: The interatrial septum was intact without evidence of patent foramen ovale or atrial septal defect by color Doppler or bubble study.   7. Left atrium: There was no thrombus seen within left atrium or left atrial appendage.  8. Ascending aorta: There was a well-defined aortic root and sinotubular ridge without effacement or dilatation. There was mild thickening of the wall the ascending aorta but no protruding atheromatous disease appreciated. The aortic root at the sinuses of Valsalva measured 2.86 cm, at the sinotubular ridge 2.41 cm, and the proximal ascending aorta measured 2.59 cm.  9. Descending aorta: There was mild scattered atheromatous disease within the wall of the descending aorta. The descending aorta was not dilated and measured 2.2 cm.  Kipp Brood, MD

## 2015-08-10 NOTE — Progress Notes (Signed)
Hypoglycemic Event  CBG: 47  Treatment: D50 IV 50 mL  Symptoms: None  Follow-up CBG: Time:1224  CBG Result:88  Possible Reasons for Event: Unknown  Comments/MD notified: Dr. Quillian Quince  Remember to initiate Hypoglycemia Order Set & complete

## 2015-08-10 NOTE — Transfer of Care (Signed)
Immediate Anesthesia Transfer of Care Note  Patient: Kelly Castillo  Procedure(s) Performed: Procedure(s): OFF PUMP CORONARY ARTERY BYPASS GRAFTING (CABG) UTILIZING THE LEFT INTERNAL MAMMARY ARTERY (N/A) TRANSESOPHAGEAL ECHOCARDIOGRAM (TEE) (N/A)  Patient Location: SICU  Anesthesia Type:General  Level of Consciousness: sedated and unresponsive  Airway & Oxygen Therapy: Patient remains intubated per anesthesia plan and Patient placed on Ventilator (see vital sign flow sheet for setting)  Post-op Assessment: Report given to RN and Post -op Vital signs reviewed and stable  Post vital signs: Reviewed and stable  Last Vitals:  Filed Vitals:   08/10/15 0332  BP: 131/43  Pulse: 63  Temp: 36.7 C  Resp: 17    Complications: No apparent anesthesia complications

## 2015-08-11 ENCOUNTER — Inpatient Hospital Stay (HOSPITAL_COMMUNITY): Payer: Medicare HMO

## 2015-08-11 LAB — BASIC METABOLIC PANEL
Anion gap: 6 (ref 5–15)
BUN: 11 mg/dL (ref 6–20)
CO2: 24 mmol/L (ref 22–32)
Calcium: 8.3 mg/dL — ABNORMAL LOW (ref 8.9–10.3)
Chloride: 104 mmol/L (ref 101–111)
Creatinine, Ser: 0.7 mg/dL (ref 0.44–1.00)
GFR calc Af Amer: >60 mL/min (ref >60)
GFR calc non Af Amer: >60 mL/min (ref >60)
Glucose, Bld: 157 mg/dL — ABNORMAL HIGH (ref 65–99)
Potassium: 3.9 mmol/L (ref 3.5–5.1)
Sodium: 134 mmol/L — ABNORMAL LOW (ref 135–145)

## 2015-08-11 LAB — CBC
HCT: 25.4 % — ABNORMAL LOW (ref 36.0–46.0)
HCT: 23.8 % — ABNORMAL LOW (ref 36.0–46.0)
Hemoglobin: 8 g/dL — ABNORMAL LOW (ref 12.0–15.0)
Hemoglobin: 7.6 g/dL — ABNORMAL LOW (ref 12.0–15.0)
MCH: 25.7 pg — ABNORMAL LOW (ref 26.0–34.0)
MCH: 26.2 pg (ref 26.0–34.0)
MCHC: 31.5 g/dL (ref 30.0–36.0)
MCHC: 31.9 g/dL (ref 30.0–36.0)
MCV: 81.7 fL (ref 78.0–100.0)
MCV: 82.1 fL (ref 78.0–100.0)
Platelets: 174 10*3/uL (ref 150–400)
Platelets: 126 10*3/uL — ABNORMAL LOW (ref 150–400)
RBC: 3.11 MIL/uL — ABNORMAL LOW (ref 3.87–5.11)
RBC: 2.9 MIL/uL — ABNORMAL LOW (ref 3.87–5.11)
RDW: 15.4 % (ref 11.5–15.5)
RDW: 15.6 % — ABNORMAL HIGH (ref 11.5–15.5)
WBC: 8.5 10*3/uL (ref 4.0–10.5)
WBC: 7.6 10*3/uL (ref 4.0–10.5)

## 2015-08-11 LAB — GLUCOSE, CAPILLARY
Glucose-Capillary: 101 mg/dL — ABNORMAL HIGH (ref 65–99)
Glucose-Capillary: 102 mg/dL — ABNORMAL HIGH (ref 65–99)
Glucose-Capillary: 107 mg/dL — ABNORMAL HIGH (ref 65–99)
Glucose-Capillary: 117 mg/dL — ABNORMAL HIGH (ref 65–99)
Glucose-Capillary: 120 mg/dL — ABNORMAL HIGH (ref 65–99)
Glucose-Capillary: 127 mg/dL — ABNORMAL HIGH (ref 65–99)
Glucose-Capillary: 147 mg/dL — ABNORMAL HIGH (ref 65–99)
Glucose-Capillary: 149 mg/dL — ABNORMAL HIGH (ref 65–99)
Glucose-Capillary: 155 mg/dL — ABNORMAL HIGH (ref 65–99)
Glucose-Capillary: 156 mg/dL — ABNORMAL HIGH (ref 65–99)
Glucose-Capillary: 182 mg/dL — ABNORMAL HIGH (ref 65–99)
Glucose-Capillary: 243 mg/dL — ABNORMAL HIGH (ref 65–99)
Glucose-Capillary: 291 mg/dL — ABNORMAL HIGH (ref 65–99)
Glucose-Capillary: 97 mg/dL (ref 65–99)

## 2015-08-11 LAB — MAGNESIUM
Magnesium: 2.4 mg/dL (ref 1.7–2.4)
Magnesium: 2.3 mg/dL (ref 1.7–2.4)

## 2015-08-11 LAB — POCT I-STAT, CHEM 8
BUN: 14 mg/dL (ref 6–20)
Calcium, Ion: 1.25 mmol/L (ref 1.13–1.30)
Chloride: 98 mmol/L — ABNORMAL LOW (ref 101–111)
Creatinine, Ser: 0.7 mg/dL (ref 0.44–1.00)
Glucose, Bld: 305 mg/dL — ABNORMAL HIGH (ref 65–99)
HCT: 24 % — ABNORMAL LOW (ref 36.0–46.0)
Hemoglobin: 8.2 g/dL — ABNORMAL LOW (ref 12.0–15.0)
Potassium: 4.5 mmol/L (ref 3.5–5.1)
Sodium: 133 mmol/L — ABNORMAL LOW (ref 135–145)
TCO2: 23 mmol/L (ref 0–100)

## 2015-08-11 LAB — HEMOGLOBIN A1C
Hgb A1c MFr Bld: 7.7 % — ABNORMAL HIGH (ref 4.8–5.6)
Mean Plasma Glucose: 174 mg/dL

## 2015-08-11 LAB — CREATININE, SERUM
Creatinine, Ser: 0.8 mg/dL (ref 0.44–1.00)
GFR calc Af Amer: >60 mL/min (ref >60)
GFR calc non Af Amer: >60 mL/min (ref >60)

## 2015-08-11 MED ORDER — INSULIN ASPART 100 UNIT/ML ~~LOC~~ SOLN
0.0000 [IU] | SUBCUTANEOUS | Status: DC
  Administered 2015-08-11: 4 [IU] via SUBCUTANEOUS
  Administered 2015-08-11: 2 [IU] via SUBCUTANEOUS
  Administered 2015-08-11: 12 [IU] via SUBCUTANEOUS
  Administered 2015-08-12: 2 [IU] via SUBCUTANEOUS
  Administered 2015-08-12: 8 [IU] via SUBCUTANEOUS

## 2015-08-11 MED ORDER — FE FUMARATE-B12-VIT C-FA-IFC PO CAPS
1.0000 | ORAL_CAPSULE | Freq: Three times a day (TID) | ORAL | Status: DC
  Administered 2015-08-12 – 2015-08-13 (×6): 1 via ORAL
  Filled 2015-08-11 (×11): qty 1

## 2015-08-11 NOTE — Progress Notes (Signed)
SICU p.m. Rounds  Patient examined and record reviewed.Hemodynamics stable,labs satisfactory.Patient had stable day.Continue current care. Kathlee Nations Trigt III 08/11/2015

## 2015-08-11 NOTE — Plan of Care (Signed)
Problem: Phase II - Intermediate Post-Op Goal: Maintain Hemodynamic Stability Outcome: Completed/Met Date Met:  08/11/15 Nitro gtt weaned off this shift Goal: CBGs/Blood Glucose per SCIP Criteria Outcome: Completed/Met Date Met:  08/11/15 Insulin gtt stopped. CBGs q4h Goal: Pain controlled with appropriate interventions Outcome: Completed/Met Date Met:  08/11/15 Pain controlled with PO medications Goal: Advance Diet Outcome: Progressing Advanced to clear liquids this shift with some nausea Goal: Activity Progressed Outcome: Completed/Met Date Met:  08/11/15 Pt up to chair and ambulating in hallways Goal: Patient advanced to Phase III: Barriers addressed Outcome: Progressing Introducer and foley likely to be removed tomorrow. Continuing to wean O2 to room air. Progressing activity.

## 2015-08-11 NOTE — Progress Notes (Signed)
1 Day Post-Op Procedure(s) (LRB): OFF PUMP CORONARY ARTERY BYPASS GRAFTING (CABG) UTILIZING THE LEFT INTERNAL MAMMARY ARTERY (N/A) TRANSESOPHAGEAL ECHOCARDIOGRAM (TEE) (N/A) Subjective: Doing well OPCAB nsr  Objective: Vital signs in last 24 hours: Temp:  [93 F (33.9 C)-99.9 F (37.7 C)] 98.8 F (37.1 C) (09/24 0800) Pulse Rate:  [70-94] 80 (09/24 0800) Cardiac Rhythm:  [-] Normal sinus rhythm;Bundle branch block (09/24 0730) Resp:  [0-39] 18 (09/24 0800) BP: (64-174)/(38-92) 118/41 mmHg (09/24 0800) SpO2:  [96 %-100 %] 98 % (09/24 0800) Arterial Line BP: (78-224)/(37-97) 132/39 mmHg (09/24 0800) FiO2 (%):  [40 %-50 %] 40 % (09/23 1515) Weight:  [105 lb 9.6 oz (47.9 kg)-108 lb 14.5 oz (49.4 kg)] 108 lb 14.5 oz (49.4 kg) (09/24 0500)  Hemodynamic parameters for last 24 hours: PAP: (14-34)/(3-22) 21/4 mmHg CO:  [2.7 L/min-4.4 L/min] 4.2 L/min CI:  [1.9 L/min/m2-3.1 L/min/m2] 3 L/min/m2  Intake/Output from previous day: 09/23 0701 - 09/24 0700 In: 4699.3 [P.O.:30; I.V.:3269.3; IV Piggyback:1400] Out: 2360 [Urine:1900; Chest Tube:460] Intake/Output this shift:    Neuro intact  Lab Results:  Recent Labs  08/10/15 1654 08/11/15 0342  WBC 7.0 8.5  HGB 7.7* 8.0*  HCT 23.7* 25.4*  PLT 140* 174   BMET:  Recent Labs  08/10/15 0317  08/10/15 1650 08/10/15 1654 08/11/15 0342  NA 137  < > 139  --  134*  K 3.9  < > 4.2  --  3.9  CL 102  < > 105  --  104  CO2 29  --   --   --  24  GLUCOSE 231*  < > 143*  --  157*  BUN 18  < > 12  --  11  CREATININE 0.95  < > 0.60 0.69 0.70  CALCIUM 9.3  --   --   --  8.3*  < > = values in this interval not displayed.  PT/INR:  Recent Labs  08/10/15 1115  LABPROT 16.2*  INR 1.29   ABG    Component Value Date/Time   PHART 7.322* 08/10/2015 1656   HCO3 28.7* 08/10/2015 1656   TCO2 30 08/10/2015 1656   ACIDBASEDEF 1.0 08/10/2015 1543   O2SAT 100.0 08/10/2015 1656   CBG (last 3)   Recent Labs  08/10/15 2154  08/10/15 2258 08/11/15 0004  GLUCAP 102* 101* 120*    Assessment/Plan: S/P Procedure(s) (LRB): OFF PUMP CORONARY ARTERY BYPASS GRAFTING (CABG) UTILIZING THE LEFT INTERNAL MAMMARY ARTERY (N/A) TRANSESOPHAGEAL ECHOCARDIOGRAM (TEE) (N/A) Mobilize d/c tubes/lines See progression orders   LOS: 3 days    Kelly Castillo 08/11/2015

## 2015-08-12 ENCOUNTER — Inpatient Hospital Stay (HOSPITAL_COMMUNITY): Payer: Medicare HMO

## 2015-08-12 LAB — CBC
HCT: 24.7 % — ABNORMAL LOW (ref 36.0–46.0)
Hemoglobin: 7.9 g/dL — ABNORMAL LOW (ref 12.0–15.0)
MCH: 26.4 pg (ref 26.0–34.0)
MCHC: 32 g/dL (ref 30.0–36.0)
MCV: 82.6 fL (ref 78.0–100.0)
Platelets: 172 10*3/uL (ref 150–400)
RBC: 2.99 MIL/uL — ABNORMAL LOW (ref 3.87–5.11)
RDW: 15.5 % (ref 11.5–15.5)
WBC: 7.5 10*3/uL (ref 4.0–10.5)

## 2015-08-12 LAB — BASIC METABOLIC PANEL
Anion gap: 5 (ref 5–15)
BUN: 11 mg/dL (ref 6–20)
CO2: 28 mmol/L (ref 22–32)
Calcium: 8.7 mg/dL — ABNORMAL LOW (ref 8.9–10.3)
Chloride: 103 mmol/L (ref 101–111)
Creatinine, Ser: 0.82 mg/dL (ref 0.44–1.00)
GFR calc Af Amer: >60 mL/min (ref >60)
GFR calc non Af Amer: >60 mL/min (ref >60)
Glucose, Bld: 106 mg/dL — ABNORMAL HIGH (ref 65–99)
Potassium: 4.3 mmol/L (ref 3.5–5.1)
Sodium: 136 mmol/L (ref 135–145)

## 2015-08-12 LAB — GLUCOSE, CAPILLARY
Glucose-Capillary: 103 mg/dL — ABNORMAL HIGH (ref 65–99)
Glucose-Capillary: 121 mg/dL — ABNORMAL HIGH (ref 65–99)
Glucose-Capillary: 171 mg/dL — ABNORMAL HIGH (ref 65–99)
Glucose-Capillary: 206 mg/dL — ABNORMAL HIGH (ref 65–99)
Glucose-Capillary: 214 mg/dL — ABNORMAL HIGH (ref 65–99)
Glucose-Capillary: 76 mg/dL (ref 65–99)

## 2015-08-12 MED ORDER — INSULIN ASPART 100 UNIT/ML ~~LOC~~ SOLN
0.0000 [IU] | Freq: Three times a day (TID) | SUBCUTANEOUS | Status: DC
  Administered 2015-08-12: 8 [IU] via SUBCUTANEOUS
  Administered 2015-08-12 – 2015-08-13 (×2): 4 [IU] via SUBCUTANEOUS
  Administered 2015-08-13 – 2015-08-14 (×4): 2 [IU] via SUBCUTANEOUS

## 2015-08-12 MED ORDER — SODIUM CHLORIDE 0.9 % IJ SOLN: 3.0000 mL | INTRAMUSCULAR | Status: DC | PRN

## 2015-08-12 MED ORDER — FUROSEMIDE 40 MG PO TABS
40.0000 mg | ORAL_TABLET | Freq: Every day | ORAL | Status: DC
  Administered 2015-08-12: 40 mg via ORAL
  Filled 2015-08-12 (×2): qty 1

## 2015-08-12 MED ORDER — SODIUM CHLORIDE 0.9 % IV SOLN: 250.0000 mL | INTRAVENOUS | Status: DC | PRN

## 2015-08-12 MED ORDER — MAGNESIUM HYDROXIDE 400 MG/5ML PO SUSP: 30.0000 mL | Freq: Every day | ORAL | Status: DC | PRN

## 2015-08-12 MED ORDER — MOVING RIGHT ALONG BOOK
Freq: Once | Status: AC
  Administered 2015-08-12: 16:00:00
  Filled 2015-08-12: qty 1

## 2015-08-12 MED ORDER — SODIUM CHLORIDE 0.9 % IJ SOLN: 3.0000 mL | Freq: Two times a day (BID) | INTRAMUSCULAR | Status: DC

## 2015-08-12 NOTE — Progress Notes (Signed)
CT surgery p.m. Rounds  Patient examined and record reviewed.Hemodynamics stable,labs satisfactory.Patient had stable day.Continue current care. Peter Van Trigt III 08/12/2015   

## 2015-08-12 NOTE — Progress Notes (Signed)
2 Days Post-Op Procedure(s) (LRB): OFF PUMP CORONARY ARTERY BYPASS GRAFTING (CABG) UTILIZING THE LEFT INTERNAL MAMMARY ARTERY (N/A) TRANSESOPHAGEAL ECHOCARDIOGRAM (TEE) (N/A) Subjective: nsr Cont to progress Small L effusion on CXR - follow  Objective: Vital signs in last 24 hours: Temp:  [97.6 F (36.4 C)-98.9 F (37.2 C)] 98.1 F (36.7 C) (09/25 0821) Pulse Rate:  [69-92] 86 (09/25 0800) Cardiac Rhythm:  [-] Normal sinus rhythm;Bundle branch block (09/25 0800) Resp:  [11-40] 22 (09/25 0800) BP: (103-169)/(34-123) 126/42 mmHg (09/25 0800) SpO2:  [89 %-99 %] 92 % (09/25 0800) Arterial Line BP: (153-172)/(38-44) 153/40 mmHg (09/24 1100) Weight:  [106 lb 0.7 oz (48.1 kg)] 106 lb 0.7 oz (48.1 kg) (09/25 0500)  Hemodynamic parameters for last 24 hours:  stable  Intake/Output from previous day: 09/24 0701 - 09/25 0700 In: 1115.3 [P.O.:720; I.V.:295.3; IV Piggyback:100] Out: 1270 [Urine:1240; Chest Tube:30] Intake/Output this shift: Total I/O In: 60 [I.V.:10; IV Piggyback:50] Out: 30 [Urine:30]  Neuro intact  Lab Results:  Recent Labs  08/11/15 1640 08/11/15 1642 08/12/15 0446  WBC 7.6  --  7.5  HGB 7.6* 8.2* 7.9*  HCT 23.8* 24.0* 24.7*  PLT 126*  --  172   BMET:  Recent Labs  08/11/15 0342  08/11/15 1642 08/12/15 0446  NA 134*  --  133* 136  K 3.9  --  4.5 4.3  CL 104  --  98* 103  CO2 24  --   --  28  GLUCOSE 157*  --  305* 106*  BUN 11  --  14 11  CREATININE 0.70  < > 0.70 0.82  CALCIUM 8.3*  --   --  8.7*  < > = values in this interval not displayed.  PT/INR:  Recent Labs  08/10/15 1115  LABPROT 16.2*  INR 1.29   ABG    Component Value Date/Time   PHART 7.322* 08/10/2015 1656   HCO3 28.7* 08/10/2015 1656   TCO2 23 08/11/2015 1642   ACIDBASEDEF 1.0 08/10/2015 1543   O2SAT 100.0 08/10/2015 1656   CBG (last 3)   Recent Labs  08/12/15 08/12/15 0401 08/12/15 0816  GLUCAP 76 103* 121*    Assessment/Plan: S/P Procedure(s) (LRB): OFF  PUMP CORONARY ARTERY BYPASS GRAFTING (CABG) UTILIZING THE LEFT INTERNAL MAMMARY ARTERY (N/A) TRANSESOPHAGEAL ECHOCARDIOGRAM (TEE) (N/A) Mobilize Diuresis Diabetes control Plan for transfer to step-down: see transfer orders small L pleural effusion   LOS: 4 days    Kelly Castillo 08/12/2015

## 2015-08-12 NOTE — Plan of Care (Signed)
Problem: Phase II - Intermediate Post-Op Goal: Advance Diet Outcome: Progressing Advanced to cardiac carb modified  Problem: Phase III - Recovery through Discharge Goal: Activity Progressed Outcome: Completed/Met Date Met:  08/12/15 Ambulating 3 times/day  Problem: Surgery Discharge Goal: Pain controlled with appropriate interventions Outcome: Completed/Met Date Met:  08/12/15 Pain controlled with minimal PO pain medication use

## 2015-08-13 ENCOUNTER — Encounter (HOSPITAL_COMMUNITY): Payer: Self-pay | Admitting: Thoracic Surgery (Cardiothoracic Vascular Surgery)

## 2015-08-13 ENCOUNTER — Inpatient Hospital Stay (HOSPITAL_COMMUNITY): Payer: Medicare HMO

## 2015-08-13 DIAGNOSIS — I080 Rheumatic disorders of both mitral and aortic valves: Secondary | ICD-10-CM

## 2015-08-13 DIAGNOSIS — R931 Abnormal findings on diagnostic imaging of heart and coronary circulation: Secondary | ICD-10-CM

## 2015-08-13 DIAGNOSIS — Z951 Presence of aortocoronary bypass graft: Secondary | ICD-10-CM

## 2015-08-13 DIAGNOSIS — I10 Essential (primary) hypertension: Secondary | ICD-10-CM

## 2015-08-13 LAB — GLUCOSE, CAPILLARY
Glucose-Capillary: 124 mg/dL — ABNORMAL HIGH (ref 65–99)
Glucose-Capillary: 147 mg/dL — ABNORMAL HIGH (ref 65–99)
Glucose-Capillary: 180 mg/dL — ABNORMAL HIGH (ref 65–99)
Glucose-Capillary: 155 mg/dL — ABNORMAL HIGH (ref 65–99)

## 2015-08-13 LAB — CBC
HCT: 26.7 % — ABNORMAL LOW (ref 36.0–46.0)
Hemoglobin: 8.5 g/dL — ABNORMAL LOW (ref 12.0–15.0)
MCH: 25.8 pg — ABNORMAL LOW (ref 26.0–34.0)
MCHC: 31.8 g/dL (ref 30.0–36.0)
MCV: 81.2 fL (ref 78.0–100.0)
Platelets: 193 10*3/uL (ref 150–400)
RBC: 3.29 MIL/uL — ABNORMAL LOW (ref 3.87–5.11)
RDW: 14.9 % (ref 11.5–15.5)
WBC: 6.5 10*3/uL (ref 4.0–10.5)

## 2015-08-13 LAB — BASIC METABOLIC PANEL
Anion gap: 4 — ABNORMAL LOW (ref 5–15)
BUN: 11 mg/dL (ref 6–20)
CO2: 31 mmol/L (ref 22–32)
Calcium: 8.6 mg/dL — ABNORMAL LOW (ref 8.9–10.3)
Chloride: 102 mmol/L (ref 101–111)
Creatinine, Ser: 0.79 mg/dL (ref 0.44–1.00)
GFR calc Af Amer: >60 mL/min (ref >60)
GFR calc non Af Amer: >60 mL/min (ref >60)
Glucose, Bld: 70 mg/dL (ref 65–99)
Potassium: 3.6 mmol/L (ref 3.5–5.1)
Sodium: 137 mmol/L (ref 135–145)

## 2015-08-13 MED ORDER — POTASSIUM CHLORIDE CRYS ER 20 MEQ PO TBCR
20.0000 meq | EXTENDED_RELEASE_TABLET | Freq: Two times a day (BID) | ORAL | Status: AC
  Administered 2015-08-13 (×2): 20 meq via ORAL
  Filled 2015-08-13 (×2): qty 1

## 2015-08-13 MED ORDER — LISINOPRIL 5 MG PO TABS
5.0000 mg | ORAL_TABLET | Freq: Every day | ORAL | Status: DC
  Administered 2015-08-13: 5 mg via ORAL
  Filled 2015-08-13: qty 1

## 2015-08-13 MED ORDER — METOPROLOL TARTRATE 25 MG PO TABS
25.0000 mg | ORAL_TABLET | Freq: Two times a day (BID) | ORAL | Status: DC
  Administered 2015-08-13 – 2015-08-14 (×3): 25 mg via ORAL
  Filled 2015-08-13 (×5): qty 1

## 2015-08-13 MED ORDER — METFORMIN HCL 500 MG PO TABS
500.0000 mg | ORAL_TABLET | Freq: Every day | ORAL | Status: DC
  Administered 2015-08-13 – 2015-08-14 (×2): 500 mg via ORAL
  Filled 2015-08-13 (×3): qty 1

## 2015-08-13 MED ORDER — POTASSIUM CHLORIDE CRYS ER 20 MEQ PO TBCR
20.0000 meq | EXTENDED_RELEASE_TABLET | ORAL | Status: DC | PRN
  Administered 2015-08-13: 20 meq via ORAL
  Filled 2015-08-13: qty 1

## 2015-08-13 NOTE — Progress Notes (Signed)
3 Days Post-Op Procedure(s) (LRB): OFF PUMP CORONARY ARTERY BYPASS GRAFTING (CABG) UTILIZING THE LEFT INTERNAL MAMMARY ARTERY (N/A) TRANSESOPHAGEAL ECHOCARDIOGRAM (TEE) (N/A) Subjective: Feels well Mild incisional soreness  Objective: Vital signs in last 24 hours: Temp:  [98 F (36.7 C)-98.6 F (37 C)] 98 F (36.7 C) (09/26 0400) Pulse Rate:  [75-110] 110 (09/26 0616) Cardiac Rhythm:  [-] Normal sinus rhythm;Bundle branch block (09/26 0200) Resp:  [15-36] 19 (09/26 0500) BP: (113-140)/(42-87) 137/61 mmHg (09/26 0500) SpO2:  [90 %-100 %] 93 % (09/26 0500) Weight:  [105 lb 11.2 oz (47.945 kg)] 105 lb 11.2 oz (47.945 kg) (09/26 0100)  Hemodynamic parameters for last 24 hours:    Intake/Output from previous day: 09/25 0701 - 09/26 0700 In: 732.5 [P.O.:660; I.V.:22.5; IV Piggyback:50] Out: 2030 [Urine:2030] Intake/Output this shift:    General appearance: alert, cooperative and no distress Neurologic: intact Heart: regular rate and rhythm Lungs: clear to auscultation bilaterally Wound: clean and dry  Lab Results:  Recent Labs  08/12/15 0446 08/13/15 0309  WBC 7.5 6.5  HGB 7.9* 8.5*  HCT 24.7* 26.7*  PLT 172 193   BMET:  Recent Labs  08/12/15 0446 08/13/15 0309  NA 136 137  K 4.3 3.6  CL 103 102  CO2 28 31  GLUCOSE 106* 70  BUN 11 11  CREATININE 0.82 0.79  CALCIUM 8.7* 8.6*    PT/INR:  Recent Labs  08/10/15 1115  LABPROT 16.2*  INR 1.29   ABG    Component Value Date/Time   PHART 7.322* 08/10/2015 1656   HCO3 28.7* 08/10/2015 1656   TCO2 23 08/11/2015 1642   ACIDBASEDEF 1.0 08/10/2015 1543   O2SAT 100.0 08/10/2015 1656   CBG (last 3)   Recent Labs  08/12/15 1219 08/12/15 1714 08/12/15 2122  GLUCAP 206* 171* 214*    Assessment/Plan: S/P Procedure(s) (LRB): OFF PUMP CORONARY ARTERY BYPASS GRAFTING (CABG) UTILIZING THE LEFT INTERNAL MAMMARY ARTERY (N/A) TRANSESOPHAGEAL ECHOCARDIOGRAM (TEE) (N/A) Plan for transfer to step-down: see  transfer orders   Awaiting bed on 2 west BP mildly elevated, increase metoprolol to 25 mg BID Supplement K Continue ambulation Restart metformin Possibly home in AM   LOS: 5 days    Loreli Slot 08/13/2015

## 2015-08-13 NOTE — Progress Notes (Addendum)
CARDIAC REHAB PHASE I   Went to ambulate with pt, pt sitting in recliner eating lunch, states she just walked over from the ICU (has walked twice today), asked to return around 3pm to walk. Pt asleep upon return, RN notified to encourage pt to ambulate later today. Will follow-up tomorrow.  Joylene Grapes, RN, BSN 08/13/2015 2:59 PM

## 2015-08-13 NOTE — Care Management Important Message (Signed)
Important Message  Patient Details  Name: Kelly Castillo MRN: [id number] Date of Birth: Aug 01, 1934   Medicare Important Message Given:  Yes-second notification given    Orson Aloe 08/13/2015, 2:04 PM

## 2015-08-13 NOTE — Progress Notes (Signed)
Report received from RN for transfer to 2W

## 2015-08-13 NOTE — Progress Notes (Signed)
Patient transferred from 2S. Alert and oriented x4. No signs of acute distress. Patient and family oriented to room and plan of care. Patient placed on telemetry. VSS.

## 2015-08-13 NOTE — Discharge Summary (Signed)
[address]       [place] [address]             [phone]              Discharge Summary  Name: Kelly Castillo DOB: 12-24-1933 81 y.o. MRN: [id number]   Admission Date: 08/08/2015 Discharge Date:     Admitting Diagnosis: Chest pain   Discharge Diagnosis:  Severe single vessel coronary artery disease Unstable angina Expected postoperative blood loss anemia  Past Medical History  Diagnosis Date  . Non-ischemic cardiomyopathy   . GERD (gastroesophageal reflux disease)   . LBBB (left bundle branch block)   . HTN (hypertension)   . Anemia   . Lumbar disc disease   . Hypercholesterolemia   . Type II diabetes mellitus   . Poor short term memory       Procedures: OFF PUMP CORONARY ARTERY BYPASS GRAFTING x 1 - 08/10/2015  Left internal mammary artery to left anterior descending    HPI:  The patient is a 81 y.o. female with a known history of nonobstructive CAD and mitral regurgitation since 2004.  At that time, she was noted to have EF 41% and 3+ MR.  She has been followed since that time and most recent cardiac catheterization from March 2015 showed 30-40% proximal LAD and 20% mid circumflex, with EF 55-65%. She was recently seen by Dr. Jens Som on 07/11/2015. At that time, she noted symptoms that were concerning for angina. As a result, Dr. Jens Som ordered  a Lexiscan nuclear study for risk stratification. This was performed 07/26/2015. There is a medium defect of moderate severity present in the apical anterior, apical septum and apex location. Findings were consistent with ischemia. EF was 53% by stress test. This was interpreted as an intermediate risk study. She also had a 2D echo which showed light reduction in EF to 45-50% and with akinesis of the basal-midanteroseptal myocardium. When compared to prior echocardiogram, anteroseptal wall motion abnormality is currently more pronounced. Cardiac catheterization was recommended to  further delineate her coronary anatomy. This was performed on 08/08/2015 and showed a tight ostial LAD stenosis.  There was a 35% circumflex lesion.  She was admitted following the procedure for cardiac surgical evaluation.    Hospital Course:  The patient was admitted to [facility] on 08/08/2015. Dr. Dorris Fetch saw the patient in consultation. It was felt that she would benefit from off pump CABG x 1.  In light of her know moderate MR, he discussed the possibility of mitral repair/replacement with Dr. Jens Som, and it was felt that since this had not changed, she would not require any mitral  intervention at this time. All risks, benefits and alternatives of surgery were explained in detail, and the patient agreed to proceed. The patient was taken to the operating room and underwent the above procedure.    The postoperative course has been generally uneventful.  She is ambulating in the hall without difficulty and is tolerating a regular diet.  Incisions are healing well. Blood pressures have been mildly elevated and beta blocker dose has been titrated accordingly.  Overall, she is progressing well and is medically stable for discharge home on today's date.     Recent vital signs:  Filed Vitals:   08/13/15 1216  BP:   Pulse:   Temp: 98.6 F (37 C)  Resp:     Recent laboratory studies:  CBC: Recent Labs  08/12/15 0446 08/13/15 0309  WBC 7.5  6.5  HGB 7.9* 8.5*  HCT 24.7* 26.7*  PLT 172 193   BMET:  Recent Labs  08/12/15 0446 08/13/15 0309  NA 136 137  K 4.3 3.6  CL 103 102  CO2 28 31  GLUCOSE 106* 70  BUN 11 11  CREATININE 0.82 0.79  CALCIUM 8.7* 8.6*    PT/INR: No results for input(s): LABPROT, INR in the last 72 hours.     Medication List    STOP taking these medications        aspirin-sod bicarb-citric acid 325 MG Tbef tablet  Commonly known as:  ALKA-SELTZER     FERREX 150 150 MG capsule  Generic drug:  iron polysaccharides     nitroGLYCERIN 0.4 MG SL  tablet  Commonly known as:  NITROSTAT      TAKE these medications        aspirin 325 MG EC tablet  Take 1 tablet (325 mg total) by mouth daily.     atorvastatin 20 MG tablet  Commonly known as:  LIPITOR  Take 1 tablet (20 mg total) by mouth daily at 6 PM.     ferrous fumarate-b12-vitamic C-folic acid capsule  Commonly known as:  TRINSICON / FOLTRIN  Take 1 capsule by mouth 3 (three) times daily after meals.     glimepiride 4 MG tablet  Commonly known as:  AMARYL  Take 4 mg by mouth 2 (two) times daily.     hydrochlorothiazide 12.5 MG capsule  Commonly known as:  MICROZIDE  Take 12.5 mg by mouth daily.     lisinopril 40 MG tablet  Commonly known as:  PRINIVIL,ZESTRIL  Take 40 mg by mouth daily.     metFORMIN 500 MG tablet  Commonly known as:  GLUCOPHAGE  Take 500 mg by mouth daily.     metoprolol tartrate 25 MG tablet  Commonly known as:  LOPRESSOR  Take 1 tablet (25 mg total) by mouth 2 (two) times daily.     traMADol 50 MG tablet  Commonly known as:  ULTRAM  Take 1-2 tablets (50-100 mg total) by mouth every 6 (six) hours as needed for moderate pain.       Discharge Instructions:  The patient is to refrain from driving, heavy lifting or strenuous activity.  May shower daily and clean incisions with soap and water.  May resume regular diet.   Follow Up: Follow-up Information    Follow up with Loreli Slot, MD On 09/12/2015.   Specialty:  Cardiothoracic Surgery   Why:  Have a chest x-ray at [facility] at 12:00, then see MD at 1:00   Contact information:   [address] [place] [address] [phone]       Follow up with Olga Millers, MD In 2 weeks.   Specialty:  Cardiology   Contact information:   [address] [place] [address] [phone]      The patient has been discharged on:  1.Beta Blocker: Yes [ x ]  No   If No, reason:    2.Ace Inhibitor/ARB: Yes [ x]  No [  ]  If No, reason:     3.Statin: Yes [ x ]  No   If No, reason:    4.Ecasa: Yes [ x ]  No   If No, reason:       COLLINS,GINA H 08/13/2015, 12:36 PM

## 2015-08-13 NOTE — Progress Notes (Signed)
Subjective:  POD #3 CABG X1 (Off pump Lima --->> LAD). No CP/SOB  Objective:  Temp:  [98 F (36.7 C)-98.7 F (37.1 C)] 98.7 F (37.1 C) (09/26 0802) Pulse Rate:  [75-110] 110 (09/26 0616) Resp:  [15-36] 19 (09/26 0500) BP: (113-140)/(42-87) 137/61 mmHg (09/26 0500) SpO2:  [90 %-100 %] 93 % (09/26 0500) Weight:  [105 lb 11.2 oz (47.945 kg)] 105 lb 11.2 oz (47.945 kg) (09/26 0100) Weight change: -5.5 oz (-0.155 kg)  Intake/Output from previous day: 09/25 0701 - 09/26 0700 In: 732.5 [P.O.:660; I.V.:22.5; IV Piggyback:50] Out: 2030 [Urine:2030]  Intake/Output from this shift:    Physical Exam: General appearance: alert and no distress Neck: no adenopathy, no carotid bruit, no JVD, supple, symmetrical, trachea midline and thyroid not enlarged, symmetric, no tenderness/mass/nodules Lungs: clear to auscultation bilaterally Heart: regular rate and rhythm, S1, S2 normal, no murmur, click, rub or gallop Extremities: extremities normal, atraumatic, no cyanosis or edema  Lab Results: Results for orders placed or performed during the hospital encounter of 08/08/15 (from the past 48 hour(s))  Glucose, capillary     Status: Abnormal   Collection Time: 08/11/15  8:27 AM  Result Value Ref Range   Glucose-Capillary 107 (H) 65 - 99 mg/dL  Glucose, capillary     Status: Abnormal   Collection Time: 08/11/15 12:15 PM  Result Value Ref Range   Glucose-Capillary 182 (H) 65 - 99 mg/dL   Comment 1 Capillary Specimen    Comment 2 Notify RN   Glucose, capillary     Status: Abnormal   Collection Time: 08/11/15  3:49 PM  Result Value Ref Range   Glucose-Capillary 291 (H) 65 - 99 mg/dL   Comment 1 Capillary Specimen    Comment 2 Notify RN   Magnesium     Status: None   Collection Time: 08/11/15  4:40 PM  Result Value Ref Range   Magnesium 2.3 1.7 - 2.4 mg/dL  CBC     Status: Abnormal   Collection Time: 08/11/15  4:40 PM  Result Value Ref Range   WBC 7.6 4.0 - 10.5 K/uL   RBC 2.90  (L) 3.87 - 5.11 MIL/uL   Hemoglobin 7.6 (L) 12.0 - 15.0 g/dL   HCT 23.8 (L) 36.0 - 46.0 %   MCV 82.1 78.0 - 100.0 fL   MCH 26.2 26.0 - 34.0 pg   MCHC 31.9 30.0 - 36.0 g/dL   RDW 15.6 (H) 11.5 - 15.5 %   Platelets 126 (L) 150 - 400 K/uL  Creatinine, serum     Status: None   Collection Time: 08/11/15  4:40 PM  Result Value Ref Range   Creatinine, Ser 0.80 0.44 - 1.00 mg/dL   GFR calc non Af Amer >60 >60 mL/min   GFR calc Af Amer >60 >60 mL/min    Comment: (NOTE) The eGFR has been calculated using the CKD EPI equation. This calculation has not been validated in all clinical situations. eGFR's persistently <60 mL/min signify possible Chronic Kidney Disease.   I-STAT, chem 8     Status: Abnormal   Collection Time: 08/11/15  4:42 PM  Result Value Ref Range   Sodium 133 (L) 135 - 145 mmol/L   Potassium 4.5 3.5 - 5.1 mmol/L   Chloride 98 (L) 101 - 111 mmol/L   BUN 14 6 - 20 mg/dL   Creatinine, Ser 0.70 0.44 - 1.00 mg/dL   Glucose, Bld 305 (H) 65 - 99 mg/dL   Calcium, Ion 1.25 1.13 -  1.30 mmol/L   TCO2 23 0 - 100 mmol/L   Hemoglobin 8.2 (L) 12.0 - 15.0 g/dL   HCT 24.0 (L) 36.0 - 46.0 %  Glucose, capillary     Status: Abnormal   Collection Time: 08/11/15  5:38 PM  Result Value Ref Range   Glucose-Capillary 243 (H) 65 - 99 mg/dL   Comment 1 Capillary Specimen   Glucose, capillary     Status: Abnormal   Collection Time: 08/11/15  7:54 PM  Result Value Ref Range   Glucose-Capillary 149 (H) 65 - 99 mg/dL  Glucose, capillary     Status: None   Collection Time: 08/12/15 12:00 AM  Result Value Ref Range   Glucose-Capillary 76 65 - 99 mg/dL   Comment 1 Notify RN    Comment 2 Document in Chart   Glucose, capillary     Status: Abnormal   Collection Time: 08/12/15  4:01 AM  Result Value Ref Range   Glucose-Capillary 103 (H) 65 - 99 mg/dL   Comment 1 Notify RN    Comment 2 Document in Chart   Basic metabolic panel     Status: Abnormal   Collection Time: 08/12/15  4:46 AM  Result  Value Ref Range   Sodium 136 135 - 145 mmol/L   Potassium 4.3 3.5 - 5.1 mmol/L   Chloride 103 101 - 111 mmol/L   CO2 28 22 - 32 mmol/L   Glucose, Bld 106 (H) 65 - 99 mg/dL   BUN 11 6 - 20 mg/dL   Creatinine, Ser 0.82 0.44 - 1.00 mg/dL   Calcium 8.7 (L) 8.9 - 10.3 mg/dL   GFR calc non Af Amer >60 >60 mL/min   GFR calc Af Amer >60 >60 mL/min    Comment: (NOTE) The eGFR has been calculated using the CKD EPI equation. This calculation has not been validated in all clinical situations. eGFR's persistently <60 mL/min signify possible Chronic Kidney Disease.    Anion gap 5 5 - 15  CBC     Status: Abnormal   Collection Time: 08/12/15  4:46 AM  Result Value Ref Range   WBC 7.5 4.0 - 10.5 K/uL   RBC 2.99 (L) 3.87 - 5.11 MIL/uL   Hemoglobin 7.9 (L) 12.0 - 15.0 g/dL   HCT 24.7 (L) 36.0 - 46.0 %   MCV 82.6 78.0 - 100.0 fL   MCH 26.4 26.0 - 34.0 pg   MCHC 32.0 30.0 - 36.0 g/dL   RDW 15.5 11.5 - 15.5 %   Platelets 172 150 - 400 K/uL  Glucose, capillary     Status: Abnormal   Collection Time: 08/12/15  8:16 AM  Result Value Ref Range   Glucose-Capillary 121 (H) 65 - 99 mg/dL   Comment 1 Capillary Specimen    Comment 2 Notify RN   Glucose, capillary     Status: Abnormal   Collection Time: 08/12/15 12:19 PM  Result Value Ref Range   Glucose-Capillary 206 (H) 65 - 99 mg/dL   Comment 1 Capillary Specimen    Comment 2 Notify RN   Glucose, capillary     Status: Abnormal   Collection Time: 08/12/15  5:14 PM  Result Value Ref Range   Glucose-Capillary 171 (H) 65 - 99 mg/dL   Comment 1 Capillary Specimen    Comment 2 Notify RN   Glucose, capillary     Status: Abnormal   Collection Time: 08/12/15  9:22 PM  Result Value Ref Range   Glucose-Capillary 214 (H) 65 -  99 mg/dL   Comment 1 Capillary Specimen   CBC     Status: Abnormal   Collection Time: 08/13/15  3:09 AM  Result Value Ref Range   WBC 6.5 4.0 - 10.5 K/uL   RBC 3.29 (L) 3.87 - 5.11 MIL/uL   Hemoglobin 8.5 (L) 12.0 - 15.0 g/dL     HCT 26.7 (L) 36.0 - 46.0 %   MCV 81.2 78.0 - 100.0 fL   MCH 25.8 (L) 26.0 - 34.0 pg   MCHC 31.8 30.0 - 36.0 g/dL   RDW 14.9 11.5 - 15.5 %   Platelets 193 150 - 400 K/uL  Basic metabolic panel     Status: Abnormal   Collection Time: 08/13/15  3:09 AM  Result Value Ref Range   Sodium 137 135 - 145 mmol/L   Potassium 3.6 3.5 - 5.1 mmol/L   Chloride 102 101 - 111 mmol/L   CO2 31 22 - 32 mmol/L   Glucose, Bld 70 65 - 99 mg/dL   BUN 11 6 - 20 mg/dL   Creatinine, Ser 0.79 0.44 - 1.00 mg/dL   Calcium 8.6 (L) 8.9 - 10.3 mg/dL   GFR calc non Af Amer >60 >60 mL/min   GFR calc Af Amer >60 >60 mL/min    Comment: (NOTE) The eGFR has been calculated using the CKD EPI equation. This calculation has not been validated in all clinical situations. eGFR's persistently <60 mL/min signify possible Chronic Kidney Disease.    Anion gap 4 (L) 5 - 15    Imaging: Imaging results have been reviewed  Tele- NSR  Assessment/Plan:   1. Active Problems: 2.   MITRAL REGURGITATION 3.   Essential hypertension 4.   Angina pectoris, crescendo 5.   Abnormal nuclear stress test 6.   S/P CABG x 1 7.   Time Spent Directly with Patient:  15 minutes  Length of Stay:  LOS: 5 days   POD #3 CABG X1 (Off pump Lima--->> LAD). Looks great!!! [employer]. NSR. VSS. Awaiting bed on Tele. Prob home tomorrow per Dr Roxan Hockey. [facility]. F/U with Dr. Stanford Breed as OP. Thx for excellent care!!!  Quay Burow 08/13/2015, 8:13 AM

## 2015-08-14 LAB — GLUCOSE, CAPILLARY
Glucose-Capillary: 128 mg/dL — ABNORMAL HIGH (ref 65–99)
Glucose-Capillary: 143 mg/dL — ABNORMAL HIGH (ref 65–99)

## 2015-08-14 MED ORDER — LISINOPRIL 10 MG PO TABS: 10.0000 mg | ORAL_TABLET | Freq: Every day | ORAL | Status: DC

## 2015-08-14 MED ORDER — ASPIRIN 325 MG PO TBEC: 325.0000 mg | DELAYED_RELEASE_TABLET | Freq: Every day | ORAL | Status: DC

## 2015-08-14 MED ORDER — FE FUMARATE-B12-VIT C-FA-IFC PO CAPS: 1.0000 | ORAL_CAPSULE | Freq: Three times a day (TID) | ORAL | Status: DC

## 2015-08-14 MED ORDER — TRAMADOL HCL 50 MG PO TABS: 50.0000 mg | ORAL_TABLET | Freq: Four times a day (QID) | ORAL | Status: DC | PRN

## 2015-08-14 MED ORDER — LISINOPRIL 40 MG PO TABS
40.0000 mg | ORAL_TABLET | Freq: Every day | ORAL | Status: DC
  Administered 2015-08-14: 40 mg via ORAL
  Filled 2015-08-14: qty 1

## 2015-08-14 NOTE — Progress Notes (Addendum)
[address]       [place] [address]             [phone]      4 Days Post-Op Procedure(s) (LRB): OFF PUMP CORONARY ARTERY BYPASS GRAFTING (CABG) UTILIZING THE LEFT INTERNAL MAMMARY ARTERY (N/A) TRANSESOPHAGEAL ECHOCARDIOGRAM (TEE) (N/A) Subjective:  mild surgical pain  Objective: Vital signs in last 24 hours: Temp:  [98 F (36.7 C)-98.6 F (37 C)] 98 F (36.7 C) (09/27 0437) Pulse Rate:  [85-107] 87 (09/27 0437) Cardiac Rhythm:  [-] Normal sinus rhythm;Bundle branch block (09/26 1930) Resp:  [18-36] 18 (09/27 0437) BP: (134-154)/(52-69) 134/57 mmHg (09/27 0437) SpO2:  [95 %-98 %] 95 % (09/27 0437) Weight:  [104 lb 11.5 oz (47.5 kg)] 104 lb 11.5 oz (47.5 kg) (09/27 0437)  Hemodynamic parameters for last 24 hours:    Intake/Output from previous day: 09/26 0701 - 09/27 0700 In: 240 [P.O.:240] Out: -  Intake/Output this shift:    General appearance: alert, cooperative and no distress Heart: regular rate and rhythm Lungs: dim in base Abdomen: benign Extremities: no signif edema Wound: incis healing well  Lab Results:  Recent Labs  08/12/15 0446 08/13/15 0309  WBC 7.5 6.5  HGB 7.9* 8.5*  HCT 24.7* 26.7*  PLT 172 193   BMET:  Recent Labs  08/12/15 0446 08/13/15 0309  NA 136 137  K 4.3 3.6  CL 103 102  CO2 28 31  GLUCOSE 106* 70  BUN 11 11  CREATININE 0.82 0.79  CALCIUM 8.7* 8.6*    PT/INR: No results for input(s): LABPROT, INR in the last 72 hours. ABG    Component Value Date/Time   PHART 7.322* 08/10/2015 1656   HCO3 28.7* 08/10/2015 1656   TCO2 23 08/11/2015 1642   ACIDBASEDEF 1.0 08/10/2015 1543   O2SAT 100.0 08/10/2015 1656   CBG (last 3)   Recent Labs  08/13/15 1630 08/13/15 2124 08/14/15 0617  GLUCAP 180* 155* 128*    Meds Scheduled Meds: . acetaminophen  1,000 mg Oral 4 times per day   Or  . acetaminophen (TYLENOL) oral liquid 160 mg/5 mL  1,000 mg Per Tube 4 times per day  . antiseptic oral  rinse  7 mL Mouth Rinse BID  . aspirin EC  325 mg Oral Daily   Or  . aspirin  324 mg Per Tube Daily  . atorvastatin  20 mg Oral q1800  . bisacodyl  10 mg Oral Daily   Or  . bisacodyl  10 mg Rectal Daily  . Chlorhexidine Gluconate Cloth  6 each Topical Daily  . docusate sodium  200 mg Oral Daily  . ferrous fumarate-b12-vitamic C-folic acid  1 capsule Oral TID PC  . insulin aspart  0-24 Units Subcutaneous TID AC & HS  . lisinopril  5 mg Oral Daily  . metFORMIN  500 mg Oral Q breakfast  . metoprolol tartrate  25 mg Oral BID  . mupirocin ointment  1 application Nasal BID  . pantoprazole  40 mg Oral Daily  . sodium chloride  3 mL Intravenous Q12H   Continuous Infusions:  PRN Meds:.magnesium hydroxide, metoprolol, ondansetron (ZOFRAN) IV, oxyCODONE, sodium chloride, traMADol  Xrays Dg Chest 2 View  08/13/2015   CLINICAL DATA:  Shortness of breath.  EXAM: CHEST  2 VIEW  COMPARISON:  08/12/2015  FINDINGS: Sternotomy wires unchanged. Lungs are adequately inflated and demonstrate persistent left base opacification likely small to moderate effusion with associated atelectasis with slight  improvement. Small amount right pleural fluid. Mild linear atelectasis over the right midlung unchanged. Cardiomediastinal silhouette and remainder of the exam is unchanged.  IMPRESSION: Mild interval improvement in left base opacification likely small to moderate effusion with associated atelectasis. Small amount right pleural fluid as well as linear atelectasis right midlung.   Electronically Signed   By: Kelly Castillo M.D.   On: 08/13/2015 07:56    Assessment/Plan: S/P Procedure(s) (LRB): OFF PUMP CORONARY ARTERY BYPASS GRAFTING (CABG) UTILIZING THE LEFT INTERNAL MAMMARY ARTERY (N/A) TRANSESOPHAGEAL ECHOCARDIOGRAM (TEE) (N/A)  1 doing well, appears stable for discharge, d/c epw's and if no new issues, home this afternoon 2 increase ace inhibitor dose for HTN- to home dose, creat normal. Also home dose  microzide  LOS: 6 days    GOLD,[name] 08/14/2015  Patient seen and examined, agree with above  Viviann Spare C. Dorris Fetch, MD [facility] [phone]

## 2015-08-14 NOTE — Care Management Note (Signed)
Case Management Note  Patient Details  Name: Kelly Castillo MRN: [id number] Date of Birth: September 25, 1934  Subjective/Objective:  Late Entry - 09/12/15 10:00 a.m.:  Pt OOB, states she has ambulated with staff and did well.  Dtr present, plans to provide 24/7 assistance as needed when pt medically stable for discharge.                             Expected Discharge Plan:  Home/Self Care  Discharge planning Services  CM Consult  Status of Service:  In process, will continue to follow  Medicare Important Message Given:  [facility] notification given  Magdalene River, RN 08/14/2015, 6:29 AM

## 2015-08-14 NOTE — Care Management Note (Addendum)
Case Management Note  Patient Details  Name: Kelly Castillo MRN: [id number] Date of Birth: 1934/08/06  Subjective/Objective:  Late Entry - 09/12/15 10:00 a.m.:  Pt OOB, states she has ambulated with staff and did well.  Dtr present, plans to provide 24/7 assistance as needed when pt medically stable for discharge.                             Expected Discharge Plan:  Home/Self Care  Discharge planning Services  CM Consult  Status of Service:  Complete, will sign off  Medicare Important Message Given:  Yes-second notification given   Disposition Plan:  Home, self care  Cherylann Parr, RN 08/14/2015, 11:43 AM   Additional Comments;  Pt will discharge to [facility] 08/14/15

## 2015-08-14 NOTE — Progress Notes (Signed)
CARDIAC REHAB PHASE I   PRE:  Rate/Rhythm: 103 ST  BP:  Supine: 150/68  Sitting:   Standing:    SaO2: 94%RA  MODE:  Ambulation: 550 ft   POST:  Rate/Rhythm: 116 ST  BP:  Supine: 160/68  Sitting:   Standing:    SaO2: 94%RA 0947-1035 Pt walked 550 ft with minimal asst and rolling walker. Pt stated she does not need walker for home. Tolerated well. Education completed with pt and daughter who voiced understanding. Encouraged IS. Has seen post op video. Discussed carb counting and heart healthy food choices. Discussed CRP 2 and permission given to refer to [place].   Luetta Nutting, RN BSN  08/14/2015 10:31 AM

## 2015-08-14 NOTE — Discharge Instructions (Signed)
Coronary Artery Bypass Grafting, Care After °These instructions give you information on caring for yourself after your procedure. Your doctor may also give you more specific instructions. Call your doctor if you have any problems or questions after your procedure.  °HOME CARE °· Only take medicine as told by your doctor. Take medicines exactly as told. Do not stop taking medicines or start any new medicines without talking to your doctor first. °· Take your pulse as told by your doctor. °· Do deep breathing as told by your doctor. Use your breathing device (incentive spirometer), if given, to practice deep breathing several times a day. Support your chest with a pillow or your arms when you take deep breaths or cough. °· Keep the area clean, dry, and protected where the surgery cuts (incisions) were made. Remove bandages (dressings) only as told by your doctor. If strips were applied to surgical area, do not take them off. They fall off on their own. °· Check the surgery area daily for puffiness (swelling), redness, or leaking fluid. °· If surgery cuts were made in your legs: °¨ Avoid crossing your legs. °¨ Avoid sitting for long periods of time. Change positions every 30 minutes. °¨ Raise your legs when you are sitting. Place them on pillows. °· Wear stockings that help keep blood clots from forming in your legs (compression stockings). °· Only take sponge baths until your doctor says it is okay to take showers. Pat the surgery area dry. Do not rub the surgery area with a washcloth or towel. Do not bathe, swim, or use a hot tub until your doctor says it is okay. °· Eat foods that are high in fiber. These include raw fruits and vegetables, whole grains, beans, and nuts. Choose lean meats. Avoid canned, processed, and fried foods. °· Drink enough fluids to keep your pee (urine) clear or pale yellow. °· Weigh yourself every day. °· Rest and limit activity as told by your doctor. You may be told to: °¨ Stop any  activity if you have chest pain, shortness of breath, changes in heartbeat, or dizziness. Get help right away if this happens. °¨ Move around often for short amounts of time or take short walks as told by your doctor. Gradually become more active. You may need help to strengthen your muscles and build endurance. °¨ Avoid lifting, pushing, or pulling anything heavier than 10 pounds (4.5 kg) for at least 6 weeks after surgery. °· Do not drive until your doctor says it is okay. °· Ask your doctor when you can go back to work. °· Ask your doctor when you can begin sexual activity again. °· Follow up with your doctor as told. °GET HELP IF: °· You have puffiness, redness, more pain, or fluid draining from the incision site. °· You have a fever. °· You have puffiness in your ankles or legs. °· You have pain in your legs. °· You gain 2 or more pounds (0.9 kg) a day. °· You feel sick to your stomach (nauseous) or throw up (vomit). °· You have watery poop (diarrhea). °GET HELP RIGHT AWAY IF: °· You have chest pain that goes to your jaw or arms. °· You have shortness of breath. °· You have a fast or irregular heartbeat. °· You notice a "clicking" in your breastbone when you move. °· You have numbness or weakness in your arms or legs. °· You feel dizzy or light-headed. °MAKE SURE YOU: °· Understand these instructions. °· Will watch your condition. °· Will get   help right away if you are not doing well or get worse. °Document Released: 11/08/2013 Document Reviewed: 11/08/2013 °[employer]® Patient Information ©2015 [employer]. This information is not intended to replace advice given to you by your health care provider. Make sure you discuss any questions you have with your health care provider. ° °

## 2015-08-14 NOTE — Progress Notes (Addendum)
Pacing wires removed. Tips in tact. No issues. Vitals stable. Will continue to monitor.  Luzmaria Devaux, Charlaine Dalton RN

## 2015-08-22 ENCOUNTER — Ambulatory Visit: Payer: Medicare HMO | Admitting: Physician Assistant

## 2015-09-05 ENCOUNTER — Encounter: Payer: Self-pay | Admitting: Physician Assistant

## 2015-09-05 ENCOUNTER — Ambulatory Visit (INDEPENDENT_AMBULATORY_CARE_PROVIDER_SITE_OTHER): Payer: Medicare HMO | Admitting: Physician Assistant

## 2015-09-05 VITALS — BP 118/48 | HR 82 | Ht 60.0 in | Wt 100.0 lb

## 2015-09-05 DIAGNOSIS — I2510 Atherosclerotic heart disease of native coronary artery without angina pectoris: Secondary | ICD-10-CM | POA: Diagnosis not present

## 2015-09-05 DIAGNOSIS — Z951 Presence of aortocoronary bypass graft: Secondary | ICD-10-CM | POA: Diagnosis not present

## 2015-09-05 DIAGNOSIS — E785 Hyperlipidemia, unspecified: Secondary | ICD-10-CM | POA: Diagnosis not present

## 2015-09-05 DIAGNOSIS — E119 Type 2 diabetes mellitus without complications: Secondary | ICD-10-CM

## 2015-09-05 DIAGNOSIS — I2583 Coronary atherosclerosis due to lipid rich plaque: Secondary | ICD-10-CM

## 2015-09-05 DIAGNOSIS — Z79899 Other long term (current) drug therapy: Secondary | ICD-10-CM | POA: Diagnosis not present

## 2015-09-05 DIAGNOSIS — I080 Rheumatic disorders of both mitral and aortic valves: Secondary | ICD-10-CM

## 2015-09-05 DIAGNOSIS — I10 Essential (primary) hypertension: Secondary | ICD-10-CM

## 2015-09-05 MED ORDER — FE FUMARATE-B12-VIT C-FA-IFC PO CAPS: 1.0000 | ORAL_CAPSULE | Freq: Three times a day (TID) | ORAL | Status: DC

## 2015-09-05 NOTE — Patient Instructions (Signed)
Your physician recommends that you return for lab work in: 6 weeks fasting.  Your physician recommends that you schedule a follow-up appointment in: 3 months with Dr. Jens Som.

## 2015-09-05 NOTE — Progress Notes (Signed)
Patient ID: Kelly Castillo, female   DOB: 27-Sep-1934, 81 y.o.   MRN: [id number]    Date:  09/05/2015   ID:  Kelly Castillo, DOB 10-Sep-1934, MRN [id number]  PCP:  Kirstie Peri, MD  Primary Cardiologist:  Jens Som   Chief Complaint  Patient presents with  . Follow-up  . Chest Pain    pt c/o no chest pain   . Shortness of Breath    no SOB or dizziness  . Edema    no swelling in legs     History of Present Illness:  Kelly Castillo is a 81 y.o. female  History of ischemic cardiomyopathy,  GERD, left bundle branch block, essential hypertension , hyperlipidemia, diabetes mellitus type 2 and anemia.   Admitted to [facility] on 08/08/2015 and underwent an off-pump coronary bypass grafting 1- LIMA graft to the LAD.    She has known moderate mitral valve regurgitation  however , since it had not changed, It was decided not to pursue surgery.  she had an uneventful postop  Course.      The patient presents for posthospital follow-up.  She reports doing very well and has no particular complaints.  She was a little concerned about starting Lipitor and has not done so yet., however, after discussion she will start taking it. She has no concerns and her incision is healing well. She denies nausea, vomiting, fever, chest pain, shortness of breath, orthopnea, dizziness, PND, cough, congestion, abdominal pain, hematochezia, melena, lower extremity edema, claudication.   Wt Readings from Last 3 Encounters:  09/05/15 100 lb (45.36 kg)  08/14/15 104 lb 11.5 oz (47.5 kg)  08/06/15 102 lb 14.4 oz (46.675 kg)     Past Medical History  Diagnosis Date  . Non-ischemic cardiomyopathy (HCC)   . GERD (gastroesophageal reflux disease)   . LBBB (left bundle branch block)   . HTN (hypertension)   . Anemia   . Lumbar disc disease   . Hypercholesterolemia   . Type II diabetes mellitus (HCC)   . Poor short term memory     Current Outpatient Prescriptions  Medication Sig Dispense Refill  .  aspirin EC 325 MG EC tablet Take 1 tablet (325 mg total) by mouth daily.    . ferrous fumarate-b12-vitamic C-folic acid (TRINSICON / FOLTRIN) capsule Take 1 capsule by mouth 3 (three) times daily after meals. 30 capsule 1  . glimepiride (AMARYL) 4 MG tablet Take 4 mg by mouth 2 (two) times daily.      . hydrochlorothiazide (MICROZIDE) 12.5 MG capsule Take 12.5 mg by mouth daily.    Marland Kitchen lisinopril (PRINIVIL,ZESTRIL) 40 MG tablet Take 40 mg by mouth daily.    . metFORMIN (GLUCOPHAGE) 500 MG tablet Take 500 mg by mouth daily.    . metoprolol tartrate (LOPRESSOR) 25 MG tablet Take 1 tablet (25 mg total) by mouth 2 (two) times daily. 60 tablet 6  . naproxen sodium (ALEVE) 220 MG tablet Take 220 mg by mouth as needed.    Marland Kitchen NITROSTAT 0.4 MG SL tablet Take 0.4 mg by mouth as needed.     No current facility-administered medications for this visit.    Allergies:   No Known Allergies  Social History:  The patient  reports that she has never smoked. She has never used smokeless tobacco. She reports that she does not drink alcohol or use illicit drugs.   Family history:   Family History  Problem Relation Age of Onset  . Anesthesia problems Neg Hx   .  Hypotension Neg Hx   . Malignant hyperthermia Neg Hx   . Pseudochol deficiency Neg Hx     ROS:  Please see the history of present illness.  All other systems reviewed and negative.   PHYSICAL EXAM: VS:  BP 118/48 mmHg  Pulse 82  Ht 5' (1.524 m)  Wt 100 lb (45.36 kg)  BMI 19.53 kg/m2 Well nourished, well developed, in no acute distress HEENT: Pupils are equal round react to light accommodation extraocular movements are intact.  Neck: no JVDNo cervical lymphadenopathy. Cardiac: Regular rate and rhythm without murmurs rubs or gallops. Lungs:  clear to auscultation bilaterally, no wheezing, rhonchi or rales Abd: soft, nontender, positive bowel sounds all quadrants, no hepatosplenomegaly Ext: no lower extremity edema.  2+ radial and dorsalis pedis  pulses. Skin: warm and dry,  Surgical incision is healing nicely. There is no erythema or discharge. Neuro:  Grossly normal  EKG:  Sinus rhythm PACs and a left bundle branch block. Rate 82 bpm    ASSESSMENT AND PLAN:  Problem List Items Addressed This Visit    S/P CABG x 1   MITRAL REGURGITATION   Relevant Medications   NITROSTAT 0.4 MG SL tablet   Essential hypertension   Relevant Medications   NITROSTAT 0.4 MG SL tablet   Diabetes mellitus type 2 in nonobese (HCC)    Other Visit Diagnoses    Hyperlipidemia    -  Primary    Relevant Medications    NITROSTAT 0.4 MG SL tablet    Other Relevant Orders    Lipid panel    Polypharmacy        Relevant Orders    Comprehensive metabolic panel    Coronary artery disease due to lipid rich plaque        Relevant Medications    NITROSTAT 0.4 MG SL tablet    Other Relevant Orders    EKG 12-Lead    Lipid panel       hyperlipidemia:   We discussed starting to take atorvastatin 20 mg. And the patient will do so. We'll check LFTs and recheck her cholesterol in 6 weeks.   coronary artery disease :   No complaints of angina. Status post coronary bypass grafting 1 with a LIMA to the LAD. Incision is healing well. She is on aspirin, statin as well as metoprolol.   Essential hypertension : blood pressure well-controlled no changes current therapy.   ischemic myopathy: based on her most recent echo she has a mildly depressed ejection fraction. She is on lisinopril 40 mg daily as well as HCTZ 12.5 mg. She appears euvolemic.   much valve regurgitation , moderate:   Stable on last echo

## 2015-09-06 ENCOUNTER — Telehealth: Payer: Self-pay | Admitting: Physician Assistant

## 2015-09-06 NOTE — Telephone Encounter (Signed)
Incoming call from pharmacy - order clarification addressed, NFQs.

## 2015-09-10 ENCOUNTER — Other Ambulatory Visit: Payer: Self-pay | Admitting: Thoracic Surgery (Cardiothoracic Vascular Surgery)

## 2015-09-10 DIAGNOSIS — Z951 Presence of aortocoronary bypass graft: Secondary | ICD-10-CM

## 2015-09-11 ENCOUNTER — Ambulatory Visit: Payer: Self-pay | Admitting: Thoracic Surgery (Cardiothoracic Vascular Surgery)

## 2015-09-11 ENCOUNTER — Ambulatory Visit: Payer: Medicare HMO | Admitting: Cardiology

## 2015-09-12 ENCOUNTER — Ambulatory Visit
Admission: RE | Admit: 2015-09-12 | Discharge: 2015-09-12 | Disposition: A | Discharge status: Home / Self Care | Payer: Medicare HMO | Source: Ambulatory Visit | Attending: Thoracic Surgery (Cardiothoracic Vascular Surgery) | Admitting: Thoracic Surgery (Cardiothoracic Vascular Surgery)

## 2015-09-12 DIAGNOSIS — Z951 Presence of aortocoronary bypass graft: Secondary | ICD-10-CM

## 2015-09-21 ENCOUNTER — Other Ambulatory Visit: Payer: Self-pay | Admitting: Thoracic Surgery (Cardiothoracic Vascular Surgery)

## 2015-09-21 DIAGNOSIS — Z951 Presence of aortocoronary bypass graft: Secondary | ICD-10-CM

## 2015-09-25 ENCOUNTER — Encounter: Payer: Self-pay | Admitting: Thoracic Surgery (Cardiothoracic Vascular Surgery)

## 2015-09-25 ENCOUNTER — Ambulatory Visit (INDEPENDENT_AMBULATORY_CARE_PROVIDER_SITE_OTHER): Payer: Self-pay | Admitting: Thoracic Surgery (Cardiothoracic Vascular Surgery)

## 2015-09-25 VITALS — BP 110/64 | HR 71 | Resp 15 | Ht 60.0 in | Wt 100.0 lb

## 2015-09-25 DIAGNOSIS — Z951 Presence of aortocoronary bypass graft: Secondary | ICD-10-CM

## 2015-09-25 DIAGNOSIS — I25110 Atherosclerotic heart disease of native coronary artery with unstable angina pectoris: Secondary | ICD-10-CM

## 2015-09-25 DIAGNOSIS — I2510 Atherosclerotic heart disease of native coronary artery without angina pectoris: Secondary | ICD-10-CM

## 2015-09-25 NOTE — Progress Notes (Signed)
[address]       [place] [address]             [phone]       HPI: Mrs. Kubota returns for a scheduled postoperative follow-up visit.  She is a 81 year old woman with known moderate mitral regurgitation. She presented with an unstable coronary syndrome and had critical LAD stenosis. Her mitral regurgitation was not severe enough to warrant surgery, so I did an off-pump CABG 1 with the left mammary to her LAD on 08/10/2015.  Her postoperative course was uncomplicated.  She is feeling well. She still is tender to touch around her sternum, but is not requiring any pain medication for that. She has not had any recurrent angina. She denies shortness of breath. She has not had any swelling in her legs. Her appetite is improving. She is walking on a daily basis. Past Medical History  Diagnosis Date  . Non-ischemic cardiomyopathy (HCC)   . GERD (gastroesophageal reflux disease)   . LBBB (left bundle branch block)   . HTN (hypertension)   . Anemia   . Lumbar disc disease   . Hypercholesterolemia   . Type II diabetes mellitus (HCC)   . Poor short term memory      Current Outpatient Prescriptions  Medication Sig Dispense Refill  . aspirin EC 325 MG EC tablet Take 1 tablet (325 mg total) by mouth daily.    . ferrous fumarate-b12-vitamic C-folic acid (TRINSICON / FOLTRIN) capsule Take 1 capsule by mouth 3 (three) times daily after meals. 30 capsule 1  . glimepiride (AMARYL) 4 MG tablet Take 4 mg by mouth 2 (two) times daily.      . hydrochlorothiazide (MICROZIDE) 12.5 MG capsule Take 12.5 mg by mouth daily.    . metFORMIN (GLUCOPHAGE) 500 MG tablet Take 500 mg by mouth daily.    . metoprolol tartrate (LOPRESSOR) 25 MG tablet Take 1 tablet (25 mg total) by mouth 2 (two) times daily. 60 tablet 6  . naproxen sodium (ALEVE) 220 MG tablet Take 220 mg by mouth as needed.    Marland Kitchen NITROSTAT 0.4 MG SL tablet Take 0.4 mg by mouth as needed.    Marland Kitchen lisinopril  (PRINIVIL,ZESTRIL) 40 MG tablet Take 40 mg by mouth daily.     No current facility-administered medications for this visit.    Physical Exam BP 110/64 mmHg  Pulse 71  Resp 15  Ht 5' (1.524 m)  Wt 100 lb (45.36 kg)  BMI 19.53 kg/m2  SpO29 5% 81 year old woman in no acute distress Alert and oriented 3 with no focal deficits Sternal incision clean dry and intact, sternum stable, mild tenderness to palpation Cardiac regular rate and rhythm normal S1 and S2 Lungs clear with equal breath sounds bilaterally No peripheral edema  Diagnostic Tests: CHEST - 2 VIEW  COMPARISON: 08/13/2015  FINDINGS: Previously seen left-sided pleural effusion has resolved in the interval. Postsurgical changes are noted. The cardiac shadow is within normal limits. The lungs are clear bilaterally. No acute bony abnormality is seen.  IMPRESSION: No active disease.   Electronically Signed  By: Alcide Clever M.D.  On: 09/12/2015 12:14  I personally reviewed the chest x-ray and concur with the findings as above  Impression: 81 year old woman who is about 6 weeks out from off-pump coronary bypass grafting 1. She is doing exceptionally well.  She does still have a little incisional tenderness but is not requiring any pain medication for that. That should improve with time.  She is walking on a regular basis without difficulty. She denies any shortness of breath or anginal pain.  At this point her activities are unrestricted, but she was advised to increase her activities gradually to avoid undue discomfort.  Plan: She will continue to be followed by Dr. Olga Millers of cardiology.  I will be happy to see her again at any time if I can be of any further assistance with her care.  Loreli Slot, MD [facility] [phone]

## 2015-10-22 LAB — COMPREHENSIVE METABOLIC PANEL
ALT: 9 U/L (ref 6–29)
AST: 18 U/L (ref 10–35)
Albumin: 4.1 g/dL (ref 3.6–5.1)
Alkaline Phosphatase: 33 U/L (ref 33–130)
BUN: 18 mg/dL (ref 7–25)
CO2: 30 mmol/L (ref 20–31)
Calcium: 9.6 mg/dL (ref 8.6–10.4)
Chloride: 104 mmol/L (ref 98–110)
Creat: 1.07 mg/dL — ABNORMAL HIGH (ref 0.60–0.93)
Glucose, Bld: 104 mg/dL — ABNORMAL HIGH (ref 65–99)
Potassium: 4.4 mmol/L (ref 3.5–5.3)
Sodium: 140 mmol/L (ref 135–146)
Total Bilirubin: 0.4 mg/dL (ref 0.2–1.2)
Total Protein: 6.5 g/dL (ref 6.1–8.1)

## 2015-10-22 LAB — LIPID PANEL
Cholesterol: 127 mg/dL (ref 125–200)
HDL: 58 mg/dL (ref >=46)
LDL Cholesterol: 58 mg/dL (ref <130)
Total CHOL/HDL Ratio: 2.2 Ratio (ref <=5.0)
Triglycerides: 57 mg/dL (ref <150)
VLDL: 11 mg/dL (ref <30)

## 2015-10-24 ENCOUNTER — Telehealth: Payer: Self-pay | Admitting: Cardiology

## 2015-10-24 MED ORDER — LISINOPRIL 10 MG PO TABS
10.0000 mg | ORAL_TABLET | Freq: Every day | ORAL | Status: DC
Start: 2015-10-24 — End: 2015-12-17

## 2015-10-24 NOTE — Telephone Encounter (Signed)
Pt says she is returning the nurse's call about some medications for the pt. She did not specify. Please f/u with her  Thanks

## 2015-10-24 NOTE — Telephone Encounter (Signed)
Returned call to patient's daughter [name].She stated she helps mother with her medication.Mother received a call from [facility] office yesterday 12/6 about her lab work.Stated she was calling back to report she is taking combination Lisinopril/HCTZ

## 2015-10-24 NOTE — Telephone Encounter (Signed)
Returned call to patient's daughter [name].Wilburt Finlay PA advised to stop Lisinopril/HCTZ.Advised to start Lisinopril 10 mg daily.Advised to keep appointment with Dr.Crenshaw 12/17/15 at 11:30 am at [facility] office.

## 2015-10-24 NOTE — Telephone Encounter (Signed)
Daughter stated mother is taking Lisinopril/HCTZ 10/12.5 mg daily.I will send message to Wilburt Finlay PA for advice on how to take.

## 2015-10-24 NOTE — Telephone Encounter (Signed)
Stop the combination medicine and write a script for lisinopril 10mg  daily.  Nazyia Gaugh, PAC

## 2015-12-13 NOTE — Progress Notes (Signed)
HPI: FU CAD. Echocardiogram September 2016 showed ejection fraction 45-50% and grade 1 diastolic dysfunction. There was moderate mitral regurgitation and mild left atrial enlargement. Trace aortic insufficiency. Cardiac catheterization September 2016 showed 90% ostial LAD and 35% circumflex. She subsequently had coronary artery bypass graft with a LIMA to the LAD. Note preoperative carotid Doppler September 2016 showed 1-39% bilateral stenosis. Since last seen, the patient denies any dyspnea on exertion, orthopnea, PND, pedal edema, palpitations, syncope or chest pain.   Current Outpatient Prescriptions  Medication Sig Dispense Refill  . aspirin EC 325 MG EC tablet Take 1 tablet (325 mg total) by mouth daily.    . ferrous fumarate-b12-vitamic C-folic acid (TRINSICON / FOLTRIN) capsule Take 1 capsule by mouth 3 (three) times daily after meals. 30 capsule 1  . glimepiride (AMARYL) 4 MG tablet Take 4 mg by mouth 2 (two) times daily.      Marland Kitchen lisinopril (PRINIVIL,ZESTRIL) 40 MG tablet Take 40 mg by mouth daily.    . metFORMIN (GLUCOPHAGE) 500 MG tablet Take 500 mg by mouth daily.    . metoprolol tartrate (LOPRESSOR) 25 MG tablet Take 1 tablet (25 mg total) by mouth 2 (two) times daily. 60 tablet 6  . naproxen sodium (ALEVE) 220 MG tablet Take 220 mg by mouth as needed.    Marland Kitchen NITROSTAT 0.4 MG SL tablet Take 0.4 mg by mouth every 5 (five) minutes as needed for chest pain.      No current facility-administered medications for this visit.     Past Medical History  Diagnosis Date  . Non-ischemic cardiomyopathy (HCC)   . GERD (gastroesophageal reflux disease)   . LBBB (left bundle branch block)   . HTN (hypertension)   . Anemia   . Lumbar disc disease   . Hypercholesterolemia   . Type II diabetes mellitus (HCC)   . Poor short term memory     Past Surgical History  Procedure Laterality Date  . Orif ankle fracture Right 1980's  . Cataract extraction w/phaco  07/24/2011    Procedure:  CATARACT EXTRACTION PHACO AND INTRAOCULAR LENS PLACEMENT (IOC);  Surgeon: Gemma Payor;  Location: [facility];  Service: Ophthalmology;  Laterality: Left;  CDE: 14.73  . Left heart catheterization with coronary angiogram N/A 01/31/2014    Procedure: LEFT HEART CATHETERIZATION WITH CORONARY ANGIOGRAM;  Surgeon: Peter M Swaziland, MD;  Location: [facility] CATH LAB;  Service: Cardiovascular;  Laterality: N/A;  . Fracture surgery    . Cardiac catheterization N/A 08/08/2015    Procedure: Left Heart Cath and Coronary Angiography;  Surgeon: Lyn Records, MD;  Location: [facility] INVASIVE CV LAB;  Service: Cardiovascular;  Laterality: N/A;  . Cardiac catheterization  01/31/2014  . Appendectomy  1970's    [place]  . Vaginal hysterectomy  1980's  . Dilation and curettage of uterus    . Tubal ligation    . Cataract extraction w/ intraocular lens implant Right   . Eye surgery Bilateral     "laser; related to diabetes"  . Coronary artery bypass graft N/A 08/10/2015    Procedure: OFF PUMP CORONARY ARTERY BYPASS GRAFTING (CABG) UTILIZING THE LEFT INTERNAL MAMMARY ARTERY;  Surgeon: Loreli Slot, MD;  Location: [facility] OR;  Service: Open Heart Surgery;  Laterality: N/A;  . Tee without cardioversion N/A 08/10/2015    Procedure: TRANSESOPHAGEAL ECHOCARDIOGRAM (TEE);  Surgeon: Loreli Slot, MD;  Location: [facility] OR;  Service: Open Heart Surgery;  Laterality: N/A;    Social History   Social History  .  Marital Status: Widowed    Spouse Name: N/A  . Number of Children: 10  . Years of Education: N/A   Occupational History  . Not on file.   Social History Main Topics  . Smoking status: Never Smoker   . Smokeless tobacco: Never Used  . Alcohol Use: No  . Drug Use: No  . Sexual Activity: Yes    Birth Control/ Protection: Surgical   Other Topics Concern  . Not on file   Social History Narrative    Family History  Problem Relation Age of Onset  . Anesthesia problems Neg Hx   . Hypotension Neg Hx   . Malignant  hyperthermia Neg Hx   . Pseudochol deficiency Neg Hx     ROS: no fevers or chills, productive cough, hemoptysis, dysphasia, odynophagia, melena, hematochezia, dysuria, hematuria, rash, seizure activity, orthopnea, PND, pedal edema, claudication. Remaining systems are negative.  Physical Exam: Well-developed well-nourished in no acute distress.  Skin is warm and dry.  HEENT is normal.  Neck is supple.  Chest is clear to auscultation with normal expansion.  Cardiovascular exam is regular rate and rhythm.  Abdominal exam nontender or distended. No masses palpated. Extremities show no edema. neuro grossly intact  ECG Sinus rhythm with PACs and left bundle branch block.

## 2015-12-17 ENCOUNTER — Encounter: Payer: Self-pay | Admitting: Cardiology

## 2015-12-17 ENCOUNTER — Ambulatory Visit (INDEPENDENT_AMBULATORY_CARE_PROVIDER_SITE_OTHER): Payer: Medicare HMO | Admitting: Cardiology

## 2015-12-17 VITALS — BP 142/60 | HR 60 | Ht 60.0 in | Wt 100.9 lb

## 2015-12-17 DIAGNOSIS — I10 Essential (primary) hypertension: Secondary | ICD-10-CM | POA: Diagnosis not present

## 2015-12-17 DIAGNOSIS — I2510 Atherosclerotic heart disease of native coronary artery without angina pectoris: Secondary | ICD-10-CM

## 2015-12-17 DIAGNOSIS — I080 Rheumatic disorders of both mitral and aortic valves: Secondary | ICD-10-CM

## 2015-12-17 NOTE — Assessment & Plan Note (Signed)
Blood pressure is reasonable. Continue present medications.

## 2015-12-17 NOTE — Assessment & Plan Note (Signed)
Patient will need follow-up echoes in the future. 

## 2015-12-17 NOTE — Assessment & Plan Note (Signed)
Continue aspirin and statin. 

## 2015-12-17 NOTE — Patient Instructions (Signed)
Your physician wants you to follow-up in: 6 MONTHS WITH DR CRENSHAW You will receive a reminder letter in the mail two months in advance. If you don't receive a letter, please call our office to schedule the follow-up appointment.   If you need a refill on your cardiac medications before your next appointment, please call your pharmacy.  

## 2016-04-19 ENCOUNTER — Other Ambulatory Visit: Payer: Self-pay | Admitting: Physician Assistant

## 2016-04-21 NOTE — Telephone Encounter (Signed)
Rx(s) sent to pharmacy electronically.  

## 2016-06-05 NOTE — Progress Notes (Signed)
HPI: FU CAD. Echocardiogram September 2016 showed ejection fraction 45-50% and grade 1 diastolic dysfunction. There was moderate mitral regurgitation and mild left atrial enlargement. Trace aortic insufficiency. Cardiac catheterization September 2016 showed 90% ostial LAD and 35% circumflex. She subsequently had coronary artery bypass graft with a LIMA to the LAD. Note preoperative carotid Doppler September 2016 showed 1-39% bilateral stenosis. Since last seen, the patient denies any dyspnea on exertion, orthopnea, PND, pedal edema, palpitations, syncope or chest pain.   Current Outpatient Prescriptions  Medication Sig Dispense Refill  . aspirin EC 325 MG EC tablet Take 1 tablet (325 mg total) by mouth daily.    Marland Kitchen atorvastatin (LIPITOR) 20 MG tablet Take 20 mg by mouth daily.    . ferrous fumarate-b12-vitamic C-folic acid (TRINSICON / FOLTRIN) capsule Take 1 capsule by mouth 3 (three) times daily after meals. 30 capsule 1  . glimepiride (AMARYL) 4 MG tablet Take 4 mg by mouth 2 (two) times daily.      Marland Kitchen lisinopril (PRINIVIL,ZESTRIL) 10 MG tablet TAKE 1 TABLET DAILY 30 tablet 10  . metFORMIN (GLUCOPHAGE) 500 MG tablet Take 500 mg by mouth daily.    . metoprolol tartrate (LOPRESSOR) 25 MG tablet Take 1 tablet (25 mg total) by mouth 2 (two) times daily. 60 tablet 6  . naproxen sodium (ALEVE) 220 MG tablet Take 220 mg by mouth as needed.    Marland Kitchen NITROSTAT 0.4 MG SL tablet Take 0.4 mg by mouth every 5 (five) minutes as needed for chest pain.      No current facility-administered medications for this visit.     Past Medical History  Diagnosis Date  . Non-ischemic cardiomyopathy (HCC)   . GERD (gastroesophageal reflux disease)   . LBBB (left bundle branch block)   . HTN (hypertension)   . Anemia   . Lumbar disc disease   . Hypercholesterolemia   . Type II diabetes mellitus (HCC)   . Poor short term memory     Past Surgical History  Procedure Laterality Date  . Orif ankle fracture  Right 1980's  . Cataract extraction w/phaco  07/24/2011    Procedure: CATARACT EXTRACTION PHACO AND INTRAOCULAR LENS PLACEMENT (IOC);  Surgeon: Gemma Payor;  Location: [facility];  Service: Ophthalmology;  Laterality: Left;  CDE: 14.73  . Left heart catheterization with coronary angiogram N/A 01/31/2014    Procedure: LEFT HEART CATHETERIZATION WITH CORONARY ANGIOGRAM;  Surgeon: Peter M Swaziland, MD;  Location: [facility] CATH LAB;  Service: Cardiovascular;  Laterality: N/A;  . Fracture surgery    . Cardiac catheterization N/A 08/08/2015    Procedure: Left Heart Cath and Coronary Angiography;  Surgeon: Lyn Records, MD;  Location: [facility] INVASIVE CV LAB;  Service: Cardiovascular;  Laterality: N/A;  . Cardiac catheterization  01/31/2014  . Appendectomy  1970's    [place]  . Vaginal hysterectomy  1980's  . Dilation and curettage of uterus    . Tubal ligation    . Cataract extraction w/ intraocular lens implant Right   . Eye surgery Bilateral     "laser; related to diabetes"  . Coronary artery bypass graft N/A 08/10/2015    Procedure: OFF PUMP CORONARY ARTERY BYPASS GRAFTING (CABG) UTILIZING THE LEFT INTERNAL MAMMARY ARTERY;  Surgeon: Loreli Slot, MD;  Location: [facility] OR;  Service: Open Heart Surgery;  Laterality: N/A;  . Tee without cardioversion N/A 08/10/2015    Procedure: TRANSESOPHAGEAL ECHOCARDIOGRAM (TEE);  Surgeon: Loreli Slot, MD;  Location: [facility] OR;  Service: Open  Heart Surgery;  Laterality: N/A;    Social History   Social History  . Marital Status: Widowed    Spouse Name: N/A  . Number of Children: 10  . Years of Education: N/A   Occupational History  . Not on file.   Social History Main Topics  . Smoking status: Never Smoker   . Smokeless tobacco: Never Used  . Alcohol Use: No  . Drug Use: No  . Sexual Activity: Yes    Birth Control/ Protection: Surgical   Other Topics Concern  . Not on file   Social History Narrative    Family History  Problem Relation Age of Onset    . Anesthesia problems Neg Hx   . Hypotension Neg Hx   . Malignant hyperthermia Neg Hx   . Pseudochol deficiency Neg Hx     ROS: no fevers or chills, productive cough, hemoptysis, dysphasia, odynophagia, melena, hematochezia, dysuria, hematuria, rash, seizure activity, orthopnea, PND, pedal edema, claudication. Remaining systems are negative.  Physical Exam: Well-developed well-nourished in no acute distress.  Skin is warm and dry.  HEENT is normal.  Neck is supple.  Chest is clear to auscultation with normal expansion.  Cardiovascular exam is regular rate and rhythm.  Abdominal exam nontender or distended. No masses palpated. Extremities show no edema. neuro grossly intact  ECG-Sinus rhythm at a rate of 63. Left bundle branch block.   A/P  1 Coronary artery disease-continue aspirin and statin.  2 hypertension-blood pressure is elevated. I have asked her to follow this at home and if it remains elevated we will increase lisinopril. Check potassium and renal function in 4 weeks.  3 mitral regurgitation-Plan repeat echocardiogram when she returns in 6 months.  4 hyperlipidemia-Given documented coronary disease I will increase Lipitor to 80 mg daily. Check lipids and liver in 4 weeks.  Olga Millers, MD

## 2016-06-06 ENCOUNTER — Encounter: Payer: Self-pay | Admitting: Cardiology

## 2016-06-06 ENCOUNTER — Ambulatory Visit (INDEPENDENT_AMBULATORY_CARE_PROVIDER_SITE_OTHER): Payer: Medicare HMO | Admitting: Cardiology

## 2016-06-06 VITALS — BP 160/60 | HR 63 | Ht <= 58 in | Wt 106.2 lb

## 2016-06-06 DIAGNOSIS — I10 Essential (primary) hypertension: Secondary | ICD-10-CM | POA: Diagnosis not present

## 2016-06-06 DIAGNOSIS — I2510 Atherosclerotic heart disease of native coronary artery without angina pectoris: Secondary | ICD-10-CM

## 2016-06-06 DIAGNOSIS — I080 Rheumatic disorders of both mitral and aortic valves: Secondary | ICD-10-CM | POA: Diagnosis not present

## 2016-06-06 DIAGNOSIS — E785 Hyperlipidemia, unspecified: Secondary | ICD-10-CM

## 2016-06-06 MED ORDER — ATORVASTATIN CALCIUM 80 MG PO TABS: 80.0000 mg | ORAL_TABLET | Freq: Every day | ORAL | Status: DC

## 2016-06-06 NOTE — Patient Instructions (Signed)
Medication Instructions:   INCREASE ATORVASTATIN TO 80 MG ONCE DAILY= 4 OF THE 20 MG TABLETS ONCE DAILY  Labwork:  Your physician recommends that you return for lab work in: 4 WEEKS= DO NOT EAT PRIOR TO LAB WORK   Follow-Up:  Your physician wants you to follow-up in: 6 MONTHS WITH DR CRENSHAW You will receive a reminder letter in the mail two months in advance. If you don't receive a letter, please call our office to schedule the follow-up appointment.   If you need a refill on your cardiac medications before your next appointment, please call your pharmacy.    

## 2016-08-06 ENCOUNTER — Encounter: Payer: Self-pay | Admitting: *Deleted

## 2016-08-06 LAB — LIPID PANEL
Cholesterol: 150 mg/dL (ref 125–200)
HDL: 64 mg/dL (ref >=46)
LDL Cholesterol: 71 mg/dL (ref <130)
Total CHOL/HDL Ratio: 2.3 Ratio (ref <=5.0)
Triglycerides: 73 mg/dL (ref <150)
VLDL: 15 mg/dL (ref <30)

## 2016-08-06 LAB — HEPATIC FUNCTION PANEL
ALT: 5 U/L — ABNORMAL LOW (ref 6–29)
AST: 14 U/L (ref 10–35)
Albumin: 4.2 g/dL (ref 3.6–5.1)
Alkaline Phosphatase: 36 U/L (ref 33–130)
Bilirubin, Direct: 0.1 mg/dL (ref <=0.2)
Indirect Bilirubin: 0.4 mg/dL (ref 0.2–1.2)
Total Bilirubin: 0.5 mg/dL (ref 0.2–1.2)
Total Protein: 6.6 g/dL (ref 6.1–8.1)

## 2016-09-10 ENCOUNTER — Other Ambulatory Visit: Payer: Self-pay | Admitting: Cardiology

## 2016-09-10 NOTE — Telephone Encounter (Signed)
Rx(s) sent to pharmacy electronically.  

## 2016-09-21 IMAGING — DX DG CHEST 2V
2 series · 2 of 2 positions shown · non-contrast
Comparison: None.

CLINICAL DATA: Mitral valve insufficiency.

EXAM:
CHEST  2 VIEW

[chest pa]
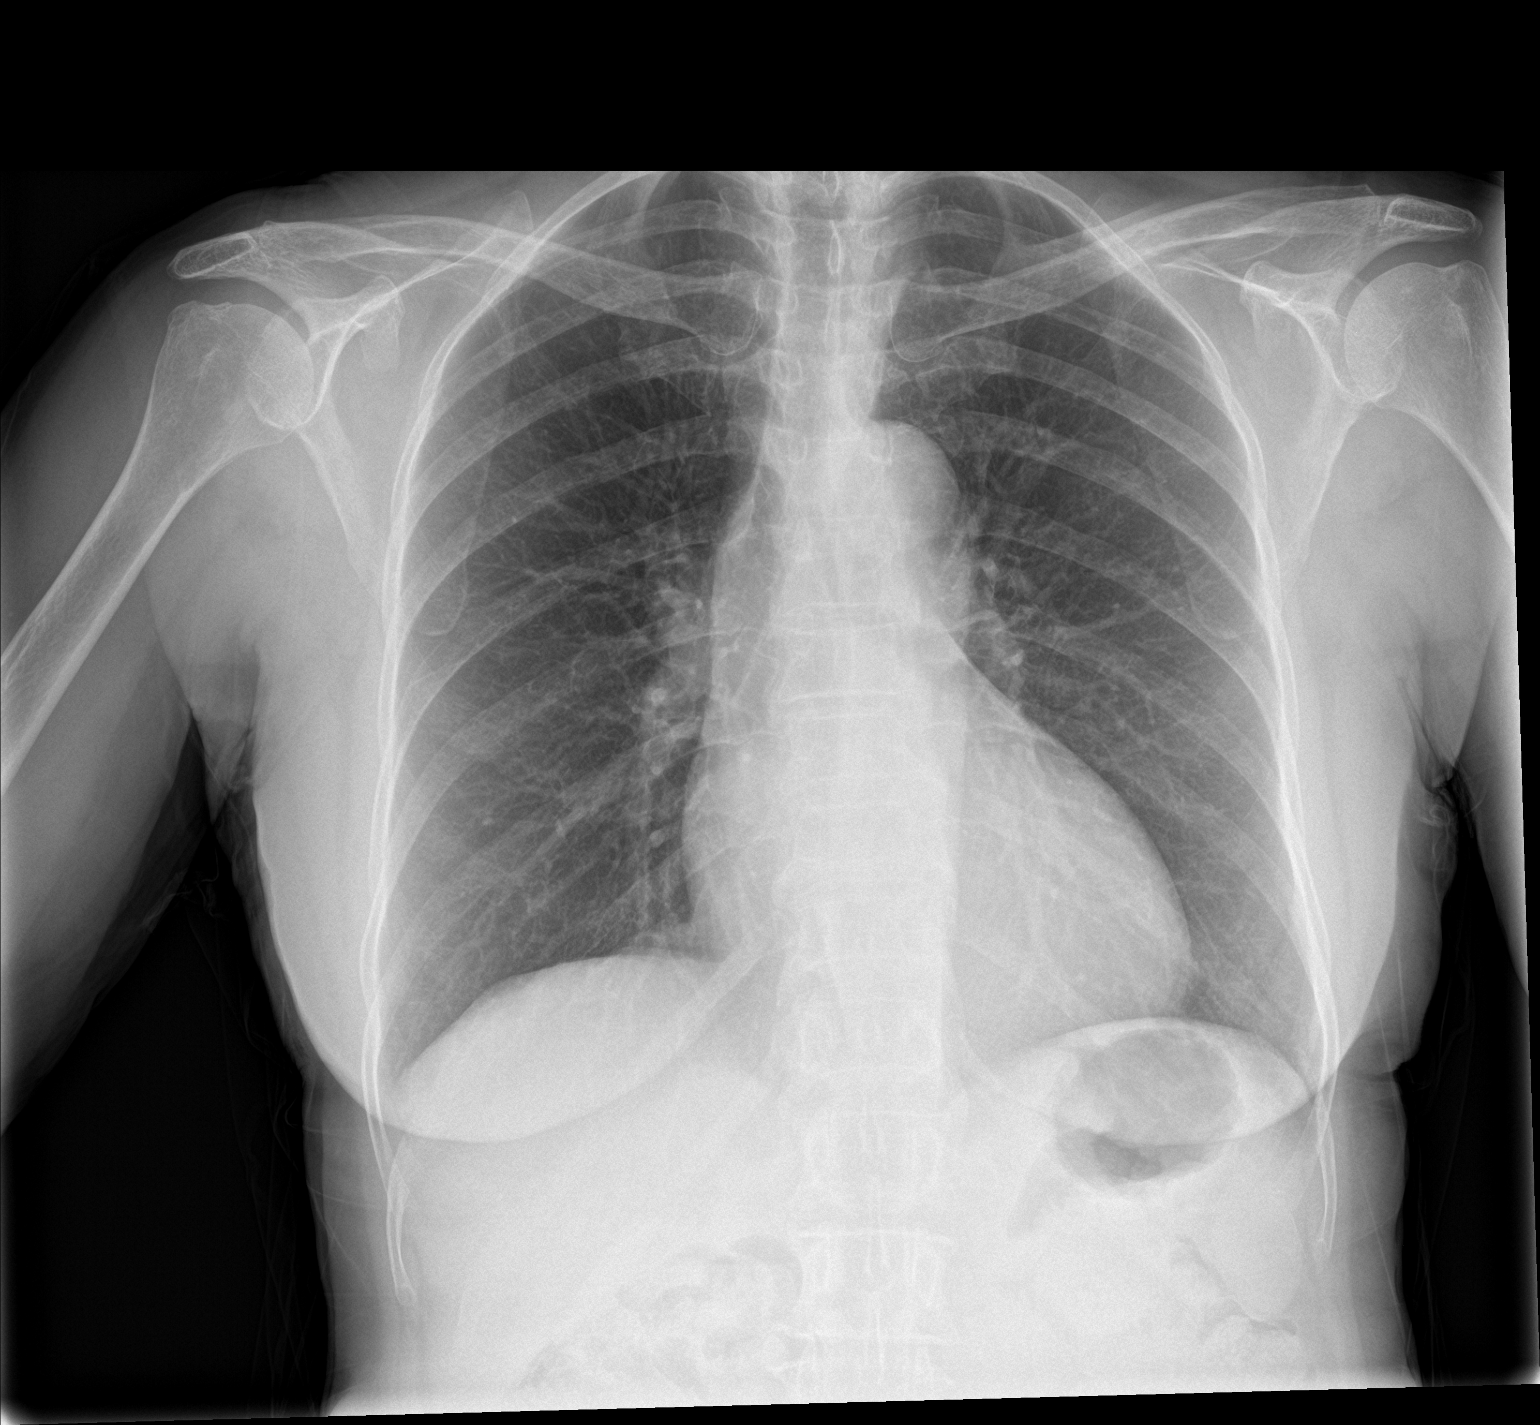

[chest lat]
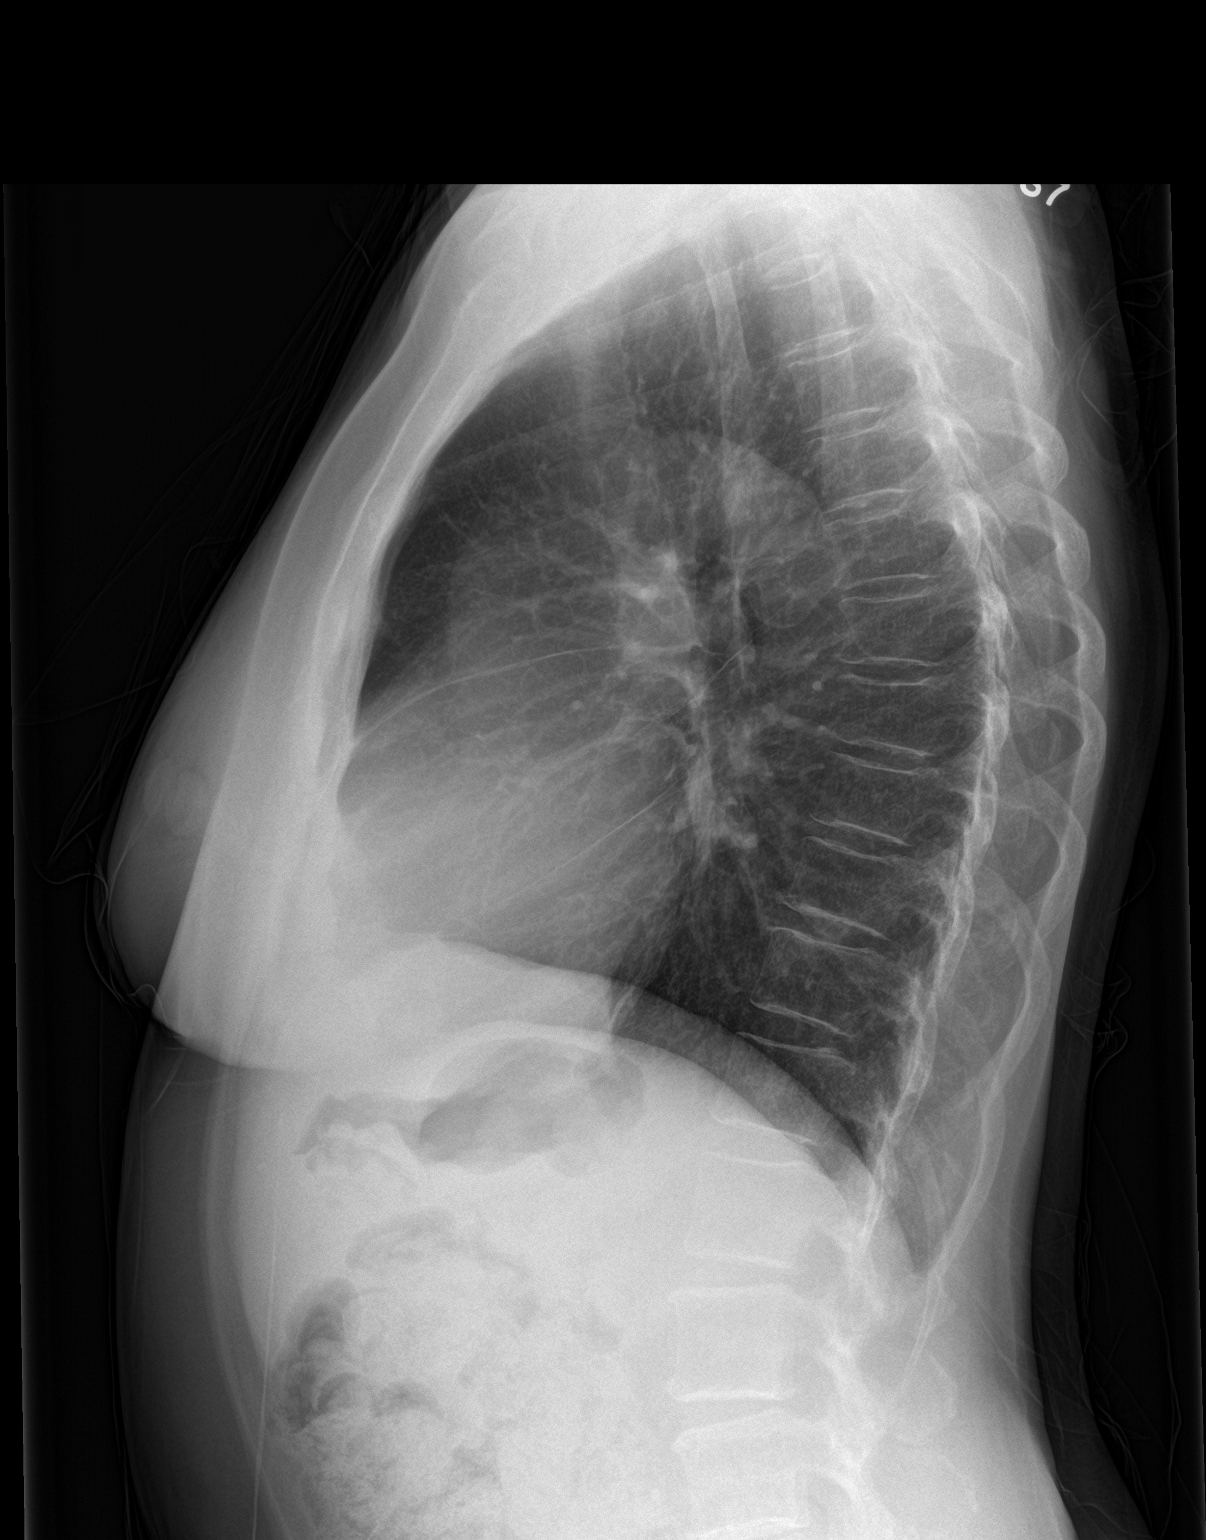

[2 of 2 positions shown; findings below may reference images not displayed]

FINDINGS: Mediastinum and hilar structures normal. Lungs are clear. Heart size
normal. No pleural effusion or pneumothorax. No acute bony
abnormality identified.
IMPRESSION: No acute cardiopulmonary disease.

## 2016-09-24 IMAGING — CR DG CHEST 1V PORT
1 series · 1 of 1 positions shown · non-contrast
Comparison: 08/07/2015.

CLINICAL DATA: Postop chest radiograph following CABG surgery.

EXAM:
PORTABLE CHEST 1 VIEW

[AP]
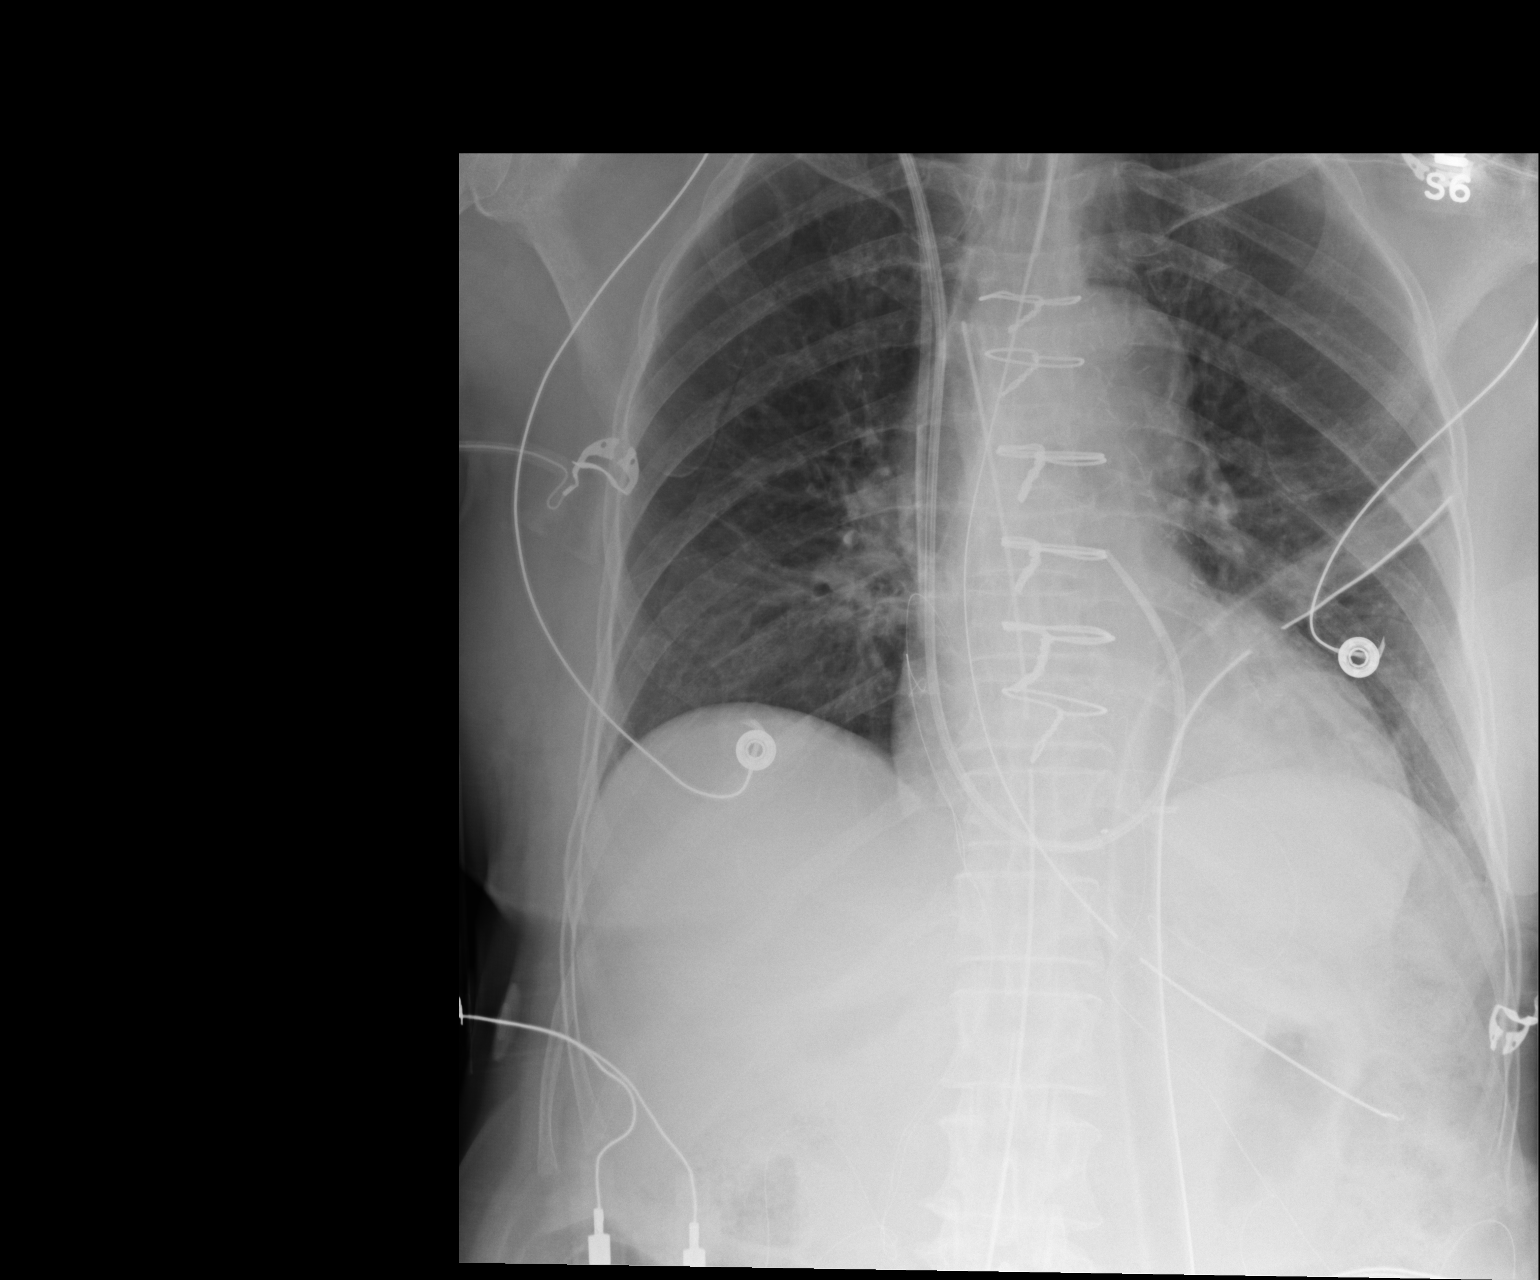

[1 of 1 positions shown; findings below may reference images not displayed]

FINDINGS: Since the prior exam, CABG surgery has been performed. Standard
lines and tubes are in place, including: An endotracheal tube with
its tip 3.2 cm above the carina, a right internal jugular Swan-Ganz
catheter with its tip projecting in the main pulmonary artery, a
second right internal jugular central venous catheter with its tip
in the lower superior vena cava, a mediastinal tube and a left-sided
chest tube. There is also an orogastric tube passing below the
diaphragm well into stomach.

The cardiac silhouette is normal in size and configuration. There is
no mediastinal widening. The lungs are clear. No evidence of a
pneumothorax or significant pleural effusion.
IMPRESSION: 1. No acute findings in the lungs. No evidence of an operative
complication.
2. Support apparatus is well positioned as detailed above.

## 2016-09-25 IMAGING — CR DG CHEST 1V PORT
1 series · 1 of 1 positions shown · non-contrast
Comparison: 08/10/2015

CLINICAL DATA: CABG, shortness over

EXAM:
PORTABLE CHEST 1 VIEW

[AP]
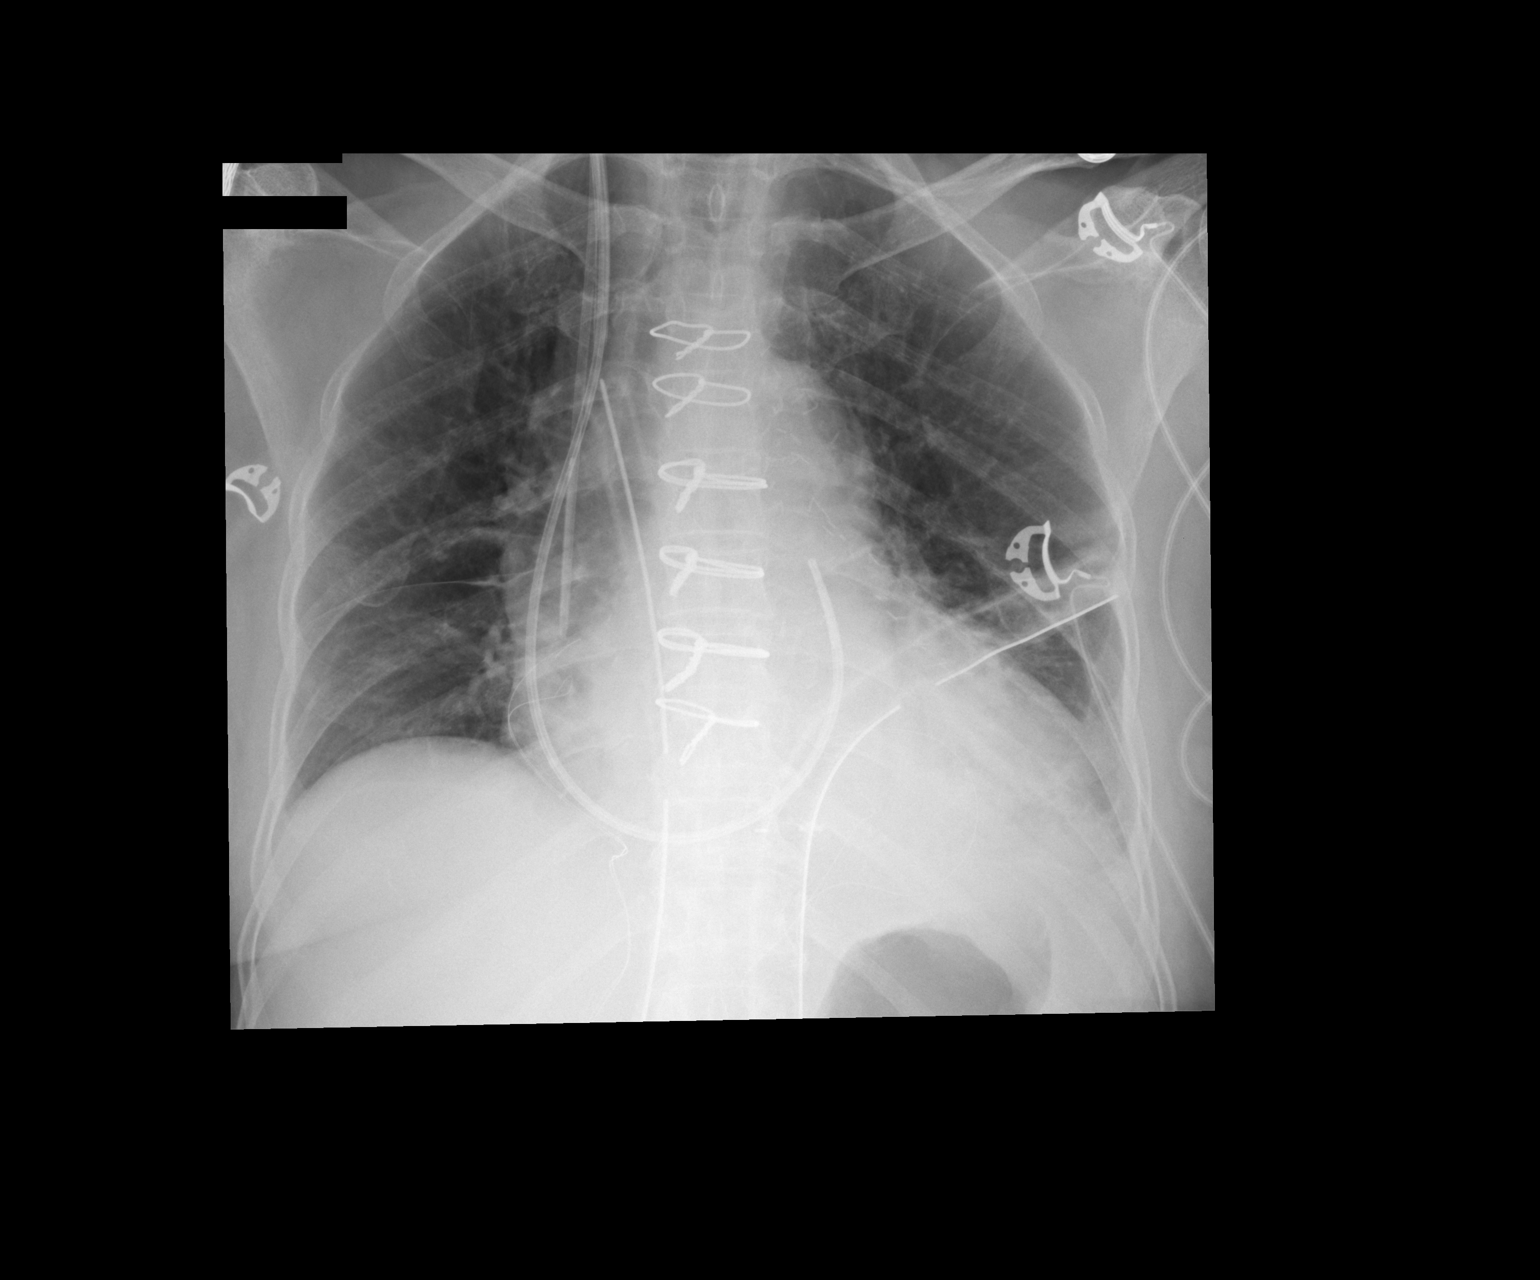

[1 of 1 positions shown; findings below may reference images not displayed]

FINDINGS: Interval extubation. Interval removal of NG tube. Support devices
otherwise stable. Left chest tube in place. No pneumothorax. There
is cardiomegaly. Increasing left basilar atelectasis. Right lung is
clear. No effusions.
IMPRESSION: Interval extubation. Increasing left basilar atelectasis. No
pneumothorax.

## 2016-09-26 IMAGING — CR DG CHEST 1V PORT
1 series · 1 of 1 positions shown · non-contrast
Comparison: 08/11/2015

CLINICAL DATA: Shortness of breath

EXAM:
PORTABLE CHEST 1 VIEW

[AP]
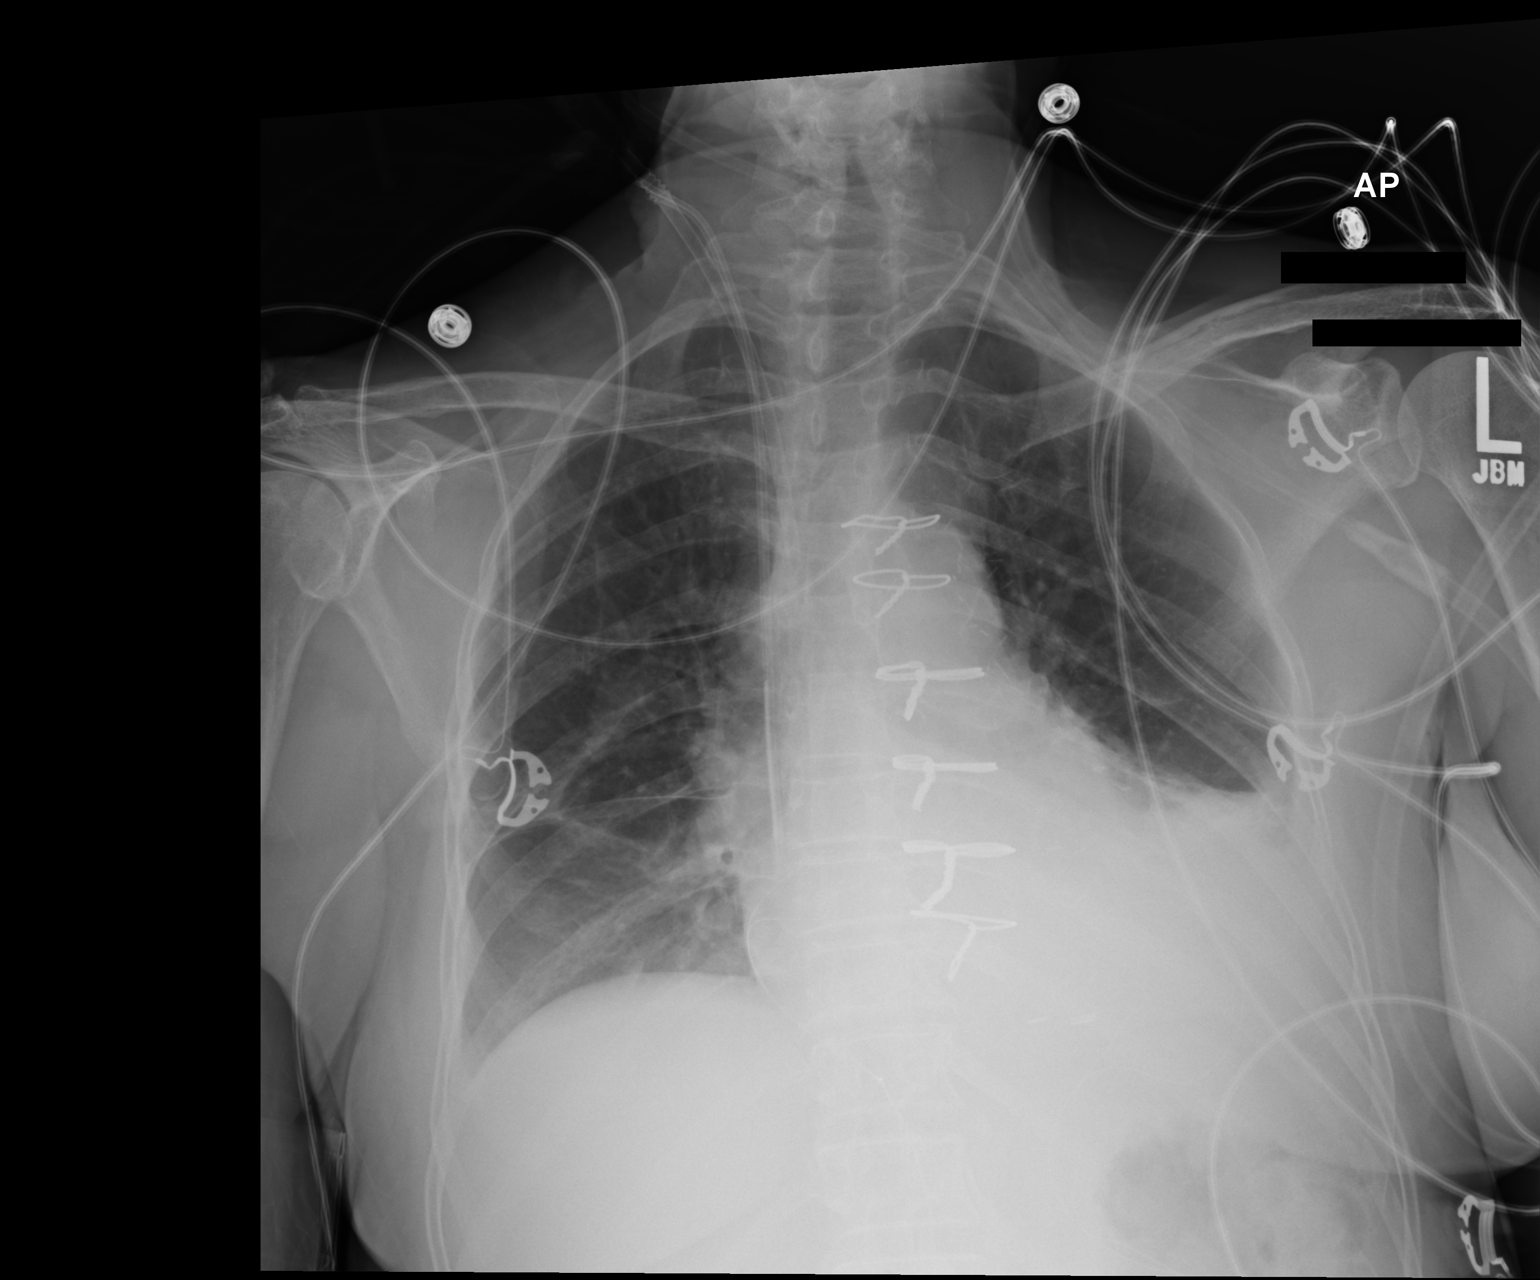

[1 of 1 positions shown; findings below may reference images not displayed]

FINDINGS: Interval removal of Swan-Ganz catheter and left chest tube. No
pneumothorax. Increasing left lower lobe opacity, likely
atelectasis. Small left effusion. Right lung is clear. Mild
cardiomegaly.
IMPRESSION: Interval removal of left chest tube without pneumothorax. Increasing
left basilar atelectasis. Small left effusion.

## 2016-12-16 DIAGNOSIS — E119 Type 2 diabetes mellitus without complications: Secondary | ICD-10-CM | POA: Diagnosis not present

## 2016-12-16 DIAGNOSIS — I10 Essential (primary) hypertension: Secondary | ICD-10-CM | POA: Diagnosis not present

## 2017-01-20 DIAGNOSIS — Z299 Encounter for prophylactic measures, unspecified: Secondary | ICD-10-CM | POA: Diagnosis not present

## 2017-01-20 DIAGNOSIS — Z789 Other specified health status: Secondary | ICD-10-CM | POA: Diagnosis not present

## 2017-01-20 DIAGNOSIS — E1365 Other specified diabetes mellitus with hyperglycemia: Secondary | ICD-10-CM | POA: Diagnosis not present

## 2017-01-20 DIAGNOSIS — Z6821 Body mass index (BMI) 21.0-21.9, adult: Secondary | ICD-10-CM | POA: Diagnosis not present

## 2017-01-20 DIAGNOSIS — M810 Age-related osteoporosis without current pathological fracture: Secondary | ICD-10-CM | POA: Diagnosis not present

## 2017-01-20 DIAGNOSIS — E1322 Other specified diabetes mellitus with diabetic chronic kidney disease: Secondary | ICD-10-CM | POA: Diagnosis not present

## 2017-01-20 DIAGNOSIS — N183 Chronic kidney disease, stage 3 (moderate): Secondary | ICD-10-CM | POA: Diagnosis not present

## 2017-01-20 DIAGNOSIS — K219 Gastro-esophageal reflux disease without esophagitis: Secondary | ICD-10-CM | POA: Diagnosis not present

## 2017-01-20 DIAGNOSIS — E1165 Type 2 diabetes mellitus with hyperglycemia: Secondary | ICD-10-CM | POA: Diagnosis not present

## 2017-01-20 DIAGNOSIS — N182 Chronic kidney disease, stage 2 (mild): Secondary | ICD-10-CM | POA: Diagnosis not present

## 2017-02-20 ENCOUNTER — Other Ambulatory Visit: Payer: Self-pay | Admitting: Cardiology

## 2017-03-25 DIAGNOSIS — E119 Type 2 diabetes mellitus without complications: Secondary | ICD-10-CM | POA: Diagnosis not present

## 2017-03-25 DIAGNOSIS — I10 Essential (primary) hypertension: Secondary | ICD-10-CM | POA: Diagnosis not present

## 2017-04-29 DIAGNOSIS — E7800 Pure hypercholesterolemia, unspecified: Secondary | ICD-10-CM | POA: Diagnosis not present

## 2017-04-29 DIAGNOSIS — Z7189 Other specified counseling: Secondary | ICD-10-CM | POA: Diagnosis not present

## 2017-04-29 DIAGNOSIS — E1122 Type 2 diabetes mellitus with diabetic chronic kidney disease: Secondary | ICD-10-CM | POA: Diagnosis not present

## 2017-04-29 DIAGNOSIS — Z6821 Body mass index (BMI) 21.0-21.9, adult: Secondary | ICD-10-CM | POA: Diagnosis not present

## 2017-04-29 DIAGNOSIS — Z79899 Other long term (current) drug therapy: Secondary | ICD-10-CM | POA: Diagnosis not present

## 2017-04-29 DIAGNOSIS — Z299 Encounter for prophylactic measures, unspecified: Secondary | ICD-10-CM | POA: Diagnosis not present

## 2017-04-29 DIAGNOSIS — E559 Vitamin D deficiency, unspecified: Secondary | ICD-10-CM | POA: Diagnosis not present

## 2017-04-29 DIAGNOSIS — R5383 Other fatigue: Secondary | ICD-10-CM | POA: Diagnosis not present

## 2017-04-29 DIAGNOSIS — Z1211 Encounter for screening for malignant neoplasm of colon: Secondary | ICD-10-CM | POA: Diagnosis not present

## 2017-04-29 DIAGNOSIS — Z1389 Encounter for screening for other disorder: Secondary | ICD-10-CM | POA: Diagnosis not present

## 2017-04-29 DIAGNOSIS — Z0000 Encounter for general adult medical examination without abnormal findings: Secondary | ICD-10-CM | POA: Diagnosis not present

## 2017-05-19 DIAGNOSIS — E119 Type 2 diabetes mellitus without complications: Secondary | ICD-10-CM | POA: Diagnosis not present

## 2017-05-19 DIAGNOSIS — I10 Essential (primary) hypertension: Secondary | ICD-10-CM | POA: Diagnosis not present

## 2017-06-23 DIAGNOSIS — I10 Essential (primary) hypertension: Secondary | ICD-10-CM | POA: Diagnosis not present

## 2017-06-23 DIAGNOSIS — E119 Type 2 diabetes mellitus without complications: Secondary | ICD-10-CM | POA: Diagnosis not present

## 2017-07-27 DIAGNOSIS — I10 Essential (primary) hypertension: Secondary | ICD-10-CM | POA: Diagnosis not present

## 2017-07-27 DIAGNOSIS — E119 Type 2 diabetes mellitus without complications: Secondary | ICD-10-CM | POA: Diagnosis not present

## 2017-07-28 ENCOUNTER — Other Ambulatory Visit: Payer: Self-pay | Admitting: Cardiology

## 2017-07-28 NOTE — Telephone Encounter (Signed)
REFILL 

## 2017-08-04 DIAGNOSIS — E7800 Pure hypercholesterolemia, unspecified: Secondary | ICD-10-CM | POA: Diagnosis not present

## 2017-08-04 DIAGNOSIS — E1122 Type 2 diabetes mellitus with diabetic chronic kidney disease: Secondary | ICD-10-CM | POA: Diagnosis not present

## 2017-08-04 DIAGNOSIS — N183 Chronic kidney disease, stage 3 (moderate): Secondary | ICD-10-CM | POA: Diagnosis not present

## 2017-08-04 DIAGNOSIS — Z299 Encounter for prophylactic measures, unspecified: Secondary | ICD-10-CM | POA: Diagnosis not present

## 2017-08-04 DIAGNOSIS — I10 Essential (primary) hypertension: Secondary | ICD-10-CM | POA: Diagnosis not present

## 2017-08-04 DIAGNOSIS — E1165 Type 2 diabetes mellitus with hyperglycemia: Secondary | ICD-10-CM | POA: Diagnosis not present

## 2017-08-04 DIAGNOSIS — Z6820 Body mass index (BMI) 20.0-20.9, adult: Secondary | ICD-10-CM | POA: Diagnosis not present

## 2017-10-27 ENCOUNTER — Other Ambulatory Visit: Payer: Self-pay | Admitting: Cardiology

## 2017-11-30 DIAGNOSIS — M9903 Segmental and somatic dysfunction of lumbar region: Secondary | ICD-10-CM | POA: Diagnosis not present

## 2017-11-30 DIAGNOSIS — S338XXA Sprain of other parts of lumbar spine and pelvis, initial encounter: Secondary | ICD-10-CM | POA: Diagnosis not present

## 2017-11-30 DIAGNOSIS — M47816 Spondylosis without myelopathy or radiculopathy, lumbar region: Secondary | ICD-10-CM | POA: Diagnosis not present

## 2017-12-03 DIAGNOSIS — M47816 Spondylosis without myelopathy or radiculopathy, lumbar region: Secondary | ICD-10-CM | POA: Diagnosis not present

## 2017-12-03 DIAGNOSIS — S338XXA Sprain of other parts of lumbar spine and pelvis, initial encounter: Secondary | ICD-10-CM | POA: Diagnosis not present

## 2017-12-03 DIAGNOSIS — M9903 Segmental and somatic dysfunction of lumbar region: Secondary | ICD-10-CM | POA: Diagnosis not present

## 2017-12-25 DIAGNOSIS — S338XXA Sprain of other parts of lumbar spine and pelvis, initial encounter: Secondary | ICD-10-CM | POA: Diagnosis not present

## 2017-12-25 DIAGNOSIS — M47816 Spondylosis without myelopathy or radiculopathy, lumbar region: Secondary | ICD-10-CM | POA: Diagnosis not present

## 2017-12-25 DIAGNOSIS — M9903 Segmental and somatic dysfunction of lumbar region: Secondary | ICD-10-CM | POA: Diagnosis not present

## 2018-01-25 DIAGNOSIS — M47816 Spondylosis without myelopathy or radiculopathy, lumbar region: Secondary | ICD-10-CM | POA: Diagnosis not present

## 2018-01-25 DIAGNOSIS — S338XXA Sprain of other parts of lumbar spine and pelvis, initial encounter: Secondary | ICD-10-CM | POA: Diagnosis not present

## 2018-01-25 DIAGNOSIS — M9903 Segmental and somatic dysfunction of lumbar region: Secondary | ICD-10-CM | POA: Diagnosis not present

## 2018-02-04 ENCOUNTER — Other Ambulatory Visit: Payer: Self-pay | Admitting: Cardiology

## 2018-02-05 NOTE — Telephone Encounter (Signed)
Rx has been sent to the pharmacy electronically. ° °

## 2018-02-09 ENCOUNTER — Other Ambulatory Visit: Payer: Self-pay | Admitting: Cardiology

## 2018-02-15 DIAGNOSIS — M542 Cervicalgia: Secondary | ICD-10-CM | POA: Diagnosis not present

## 2018-02-23 DIAGNOSIS — S338XXA Sprain of other parts of lumbar spine and pelvis, initial encounter: Secondary | ICD-10-CM | POA: Diagnosis not present

## 2018-02-23 DIAGNOSIS — M9903 Segmental and somatic dysfunction of lumbar region: Secondary | ICD-10-CM | POA: Diagnosis not present

## 2018-02-23 DIAGNOSIS — M47816 Spondylosis without myelopathy or radiculopathy, lumbar region: Secondary | ICD-10-CM | POA: Diagnosis not present

## 2018-03-25 DIAGNOSIS — M47816 Spondylosis without myelopathy or radiculopathy, lumbar region: Secondary | ICD-10-CM | POA: Diagnosis not present

## 2018-03-25 DIAGNOSIS — S338XXA Sprain of other parts of lumbar spine and pelvis, initial encounter: Secondary | ICD-10-CM | POA: Diagnosis not present

## 2018-03-25 DIAGNOSIS — M9903 Segmental and somatic dysfunction of lumbar region: Secondary | ICD-10-CM | POA: Diagnosis not present

## 2018-04-08 DIAGNOSIS — M47816 Spondylosis without myelopathy or radiculopathy, lumbar region: Secondary | ICD-10-CM | POA: Diagnosis not present

## 2018-04-08 DIAGNOSIS — M9903 Segmental and somatic dysfunction of lumbar region: Secondary | ICD-10-CM | POA: Diagnosis not present

## 2018-04-08 DIAGNOSIS — S338XXA Sprain of other parts of lumbar spine and pelvis, initial encounter: Secondary | ICD-10-CM | POA: Diagnosis not present

## 2018-04-13 ENCOUNTER — Other Ambulatory Visit: Payer: Self-pay | Admitting: Cardiology

## 2018-04-13 NOTE — Telephone Encounter (Signed)
Rx sent to pharmacy   

## 2018-04-21 ENCOUNTER — Ambulatory Visit: Payer: Medicare HMO | Admitting: Cardiology

## 2018-05-03 DIAGNOSIS — Z7189 Other specified counseling: Secondary | ICD-10-CM | POA: Diagnosis not present

## 2018-05-03 DIAGNOSIS — N183 Chronic kidney disease, stage 3 (moderate): Secondary | ICD-10-CM | POA: Diagnosis not present

## 2018-05-03 DIAGNOSIS — R69 Illness, unspecified: Secondary | ICD-10-CM | POA: Diagnosis not present

## 2018-05-03 DIAGNOSIS — Z1339 Encounter for screening examination for other mental health and behavioral disorders: Secondary | ICD-10-CM | POA: Diagnosis not present

## 2018-05-03 DIAGNOSIS — Z1331 Encounter for screening for depression: Secondary | ICD-10-CM | POA: Diagnosis not present

## 2018-05-03 DIAGNOSIS — E1122 Type 2 diabetes mellitus with diabetic chronic kidney disease: Secondary | ICD-10-CM | POA: Diagnosis not present

## 2018-05-03 DIAGNOSIS — E1165 Type 2 diabetes mellitus with hyperglycemia: Secondary | ICD-10-CM | POA: Diagnosis not present

## 2018-05-03 DIAGNOSIS — Z79899 Other long term (current) drug therapy: Secondary | ICD-10-CM | POA: Diagnosis not present

## 2018-05-03 DIAGNOSIS — J309 Allergic rhinitis, unspecified: Secondary | ICD-10-CM | POA: Diagnosis not present

## 2018-05-03 DIAGNOSIS — Z1211 Encounter for screening for malignant neoplasm of colon: Secondary | ICD-10-CM | POA: Diagnosis not present

## 2018-05-03 DIAGNOSIS — E7800 Pure hypercholesterolemia, unspecified: Secondary | ICD-10-CM | POA: Diagnosis not present

## 2018-05-03 DIAGNOSIS — R5383 Other fatigue: Secondary | ICD-10-CM | POA: Diagnosis not present

## 2018-05-03 DIAGNOSIS — Z0000 Encounter for general adult medical examination without abnormal findings: Secondary | ICD-10-CM | POA: Diagnosis not present

## 2018-05-03 DIAGNOSIS — Z6820 Body mass index (BMI) 20.0-20.9, adult: Secondary | ICD-10-CM | POA: Diagnosis not present

## 2018-05-03 DIAGNOSIS — E559 Vitamin D deficiency, unspecified: Secondary | ICD-10-CM | POA: Diagnosis not present

## 2018-05-10 DIAGNOSIS — S338XXA Sprain of other parts of lumbar spine and pelvis, initial encounter: Secondary | ICD-10-CM | POA: Diagnosis not present

## 2018-05-10 DIAGNOSIS — M9903 Segmental and somatic dysfunction of lumbar region: Secondary | ICD-10-CM | POA: Diagnosis not present

## 2018-05-10 DIAGNOSIS — M47816 Spondylosis without myelopathy or radiculopathy, lumbar region: Secondary | ICD-10-CM | POA: Diagnosis not present

## 2018-05-12 ENCOUNTER — Ambulatory Visit: Payer: Medicare HMO | Admitting: Cardiology

## 2018-06-07 DIAGNOSIS — M47816 Spondylosis without myelopathy or radiculopathy, lumbar region: Secondary | ICD-10-CM | POA: Diagnosis not present

## 2018-06-07 DIAGNOSIS — M9903 Segmental and somatic dysfunction of lumbar region: Secondary | ICD-10-CM | POA: Diagnosis not present

## 2018-06-07 DIAGNOSIS — S338XXA Sprain of other parts of lumbar spine and pelvis, initial encounter: Secondary | ICD-10-CM | POA: Diagnosis not present

## 2018-07-05 DIAGNOSIS — M9903 Segmental and somatic dysfunction of lumbar region: Secondary | ICD-10-CM | POA: Diagnosis not present

## 2018-07-05 DIAGNOSIS — M47816 Spondylosis without myelopathy or radiculopathy, lumbar region: Secondary | ICD-10-CM | POA: Diagnosis not present

## 2018-07-05 DIAGNOSIS — S338XXA Sprain of other parts of lumbar spine and pelvis, initial encounter: Secondary | ICD-10-CM | POA: Diagnosis not present

## 2018-07-27 DIAGNOSIS — S338XXA Sprain of other parts of lumbar spine and pelvis, initial encounter: Secondary | ICD-10-CM | POA: Diagnosis not present

## 2018-07-27 DIAGNOSIS — M9903 Segmental and somatic dysfunction of lumbar region: Secondary | ICD-10-CM | POA: Diagnosis not present

## 2018-07-27 DIAGNOSIS — M47816 Spondylosis without myelopathy or radiculopathy, lumbar region: Secondary | ICD-10-CM | POA: Diagnosis not present

## 2018-07-28 DIAGNOSIS — M47816 Spondylosis without myelopathy or radiculopathy, lumbar region: Secondary | ICD-10-CM | POA: Diagnosis not present

## 2018-07-28 DIAGNOSIS — S338XXA Sprain of other parts of lumbar spine and pelvis, initial encounter: Secondary | ICD-10-CM | POA: Diagnosis not present

## 2018-07-28 DIAGNOSIS — M9903 Segmental and somatic dysfunction of lumbar region: Secondary | ICD-10-CM | POA: Diagnosis not present

## 2018-07-29 DIAGNOSIS — S338XXA Sprain of other parts of lumbar spine and pelvis, initial encounter: Secondary | ICD-10-CM | POA: Diagnosis not present

## 2018-07-29 DIAGNOSIS — M47816 Spondylosis without myelopathy or radiculopathy, lumbar region: Secondary | ICD-10-CM | POA: Diagnosis not present

## 2018-07-29 DIAGNOSIS — M9903 Segmental and somatic dysfunction of lumbar region: Secondary | ICD-10-CM | POA: Diagnosis not present

## 2018-08-02 DIAGNOSIS — M9903 Segmental and somatic dysfunction of lumbar region: Secondary | ICD-10-CM | POA: Diagnosis not present

## 2018-08-02 DIAGNOSIS — M47816 Spondylosis without myelopathy or radiculopathy, lumbar region: Secondary | ICD-10-CM | POA: Diagnosis not present

## 2018-08-02 DIAGNOSIS — S338XXA Sprain of other parts of lumbar spine and pelvis, initial encounter: Secondary | ICD-10-CM | POA: Diagnosis not present

## 2018-08-03 NOTE — Progress Notes (Deleted)
HPI: FU CAD. Echocardiogram September 2016 showed ejection fraction 45-50% and grade 1 diastolic dysfunction. There was moderate mitral regurgitation and mild left atrial enlargement. Trace aortic insufficiency. Cardiac catheterization September 2016 showed 90% ostial LAD and 35% circumflex. She subsequently had coronary artery bypass graft with a LIMA to the LAD. Note preoperative carotid Doppler September 2016 showed 1-39% bilateral stenosis. Since last seen,   Current Outpatient Medications  Medication Sig Dispense Refill  . aspirin EC 325 MG EC tablet Take 1 tablet (325 mg total) by mouth daily.    Marland Kitchen atorvastatin (LIPITOR) 80 MG tablet Take 1 tablet (80 mg total) by mouth daily. 90 tablet 3  . ferrous fumarate-b12-vitamic C-folic acid (TRINSICON / FOLTRIN) capsule Take 1 capsule by mouth 3 (three) times daily after meals. 30 capsule 1  . glimepiride (AMARYL) 4 MG tablet Take 4 mg by mouth 2 (two) times daily.      Marland Kitchen lisinopril (PRINIVIL,ZESTRIL) 10 MG tablet TAKE 1 TABLET DAILY 90 tablet 0  . lisinopril (PRINIVIL,ZESTRIL) 10 MG tablet TAKE 1 TABLET DAILY. NEEDS APPOINTMENT 15 tablet 0  . metFORMIN (GLUCOPHAGE) 500 MG tablet Take 500 mg by mouth daily.    . metoprolol tartrate (LOPRESSOR) 25 MG tablet Take 1 tablet (25 mg total) by mouth 2 (two) times daily. Call office to make appointment for further refills. 30 tablet 0  . naproxen sodium (ALEVE) 220 MG tablet Take 220 mg by mouth as needed.    Marland Kitchen NITROSTAT 0.4 MG SL tablet Take 0.4 mg by mouth every 5 (five) minutes as needed for chest pain.      No current facility-administered medications for this visit.      Past Medical History:  Diagnosis Date  . Anemia   . GERD (gastroesophageal reflux disease)   . HTN (hypertension)   . Hypercholesterolemia   . LBBB (left bundle branch block)   . Lumbar disc disease   . Non-ischemic cardiomyopathy (HCC)   . Poor short term memory   . Type II diabetes mellitus (HCC)     Past  Surgical History:  Procedure Laterality Date  . APPENDECTOMY  1970's   [place]  . CARDIAC CATHETERIZATION N/A 08/08/2015   Procedure: Left Heart Cath and Coronary Angiography;  Surgeon: Lyn Records, MD;  Location: [facility] INVASIVE CV LAB;  Service: Cardiovascular;  Laterality: N/A;  . CARDIAC CATHETERIZATION  01/31/2014  . CATARACT EXTRACTION W/ INTRAOCULAR LENS IMPLANT Right   . CATARACT EXTRACTION W/PHACO  07/24/2011   Procedure: CATARACT EXTRACTION PHACO AND INTRAOCULAR LENS PLACEMENT (IOC);  Surgeon: Gemma Payor;  Location: [facility] ORS;  Service: Ophthalmology;  Laterality: Left;  CDE: 14.73  . CORONARY ARTERY BYPASS GRAFT N/A 08/10/2015   Procedure: OFF PUMP CORONARY ARTERY BYPASS GRAFTING (CABG) UTILIZING THE LEFT INTERNAL MAMMARY ARTERY;  Surgeon: Loreli Slot, MD;  Location: [facility] OR;  Service: Open Heart Surgery;  Laterality: N/A;  . DILATION AND CURETTAGE OF UTERUS    . EYE SURGERY Bilateral    "laser; related to diabetes"  . FRACTURE SURGERY    . LEFT HEART CATHETERIZATION WITH CORONARY ANGIOGRAM N/A 01/31/2014   Procedure: LEFT HEART CATHETERIZATION WITH CORONARY ANGIOGRAM;  Surgeon: Peter M Swaziland, MD;  Location: [facility] CATH LAB;  Service: Cardiovascular;  Laterality: N/A;  . ORIF ANKLE FRACTURE Right 1980's  . TEE WITHOUT CARDIOVERSION N/A 08/10/2015   Procedure: TRANSESOPHAGEAL ECHOCARDIOGRAM (TEE);  Surgeon: Loreli Slot, MD;  Location: [facility] OR;  Service: Open Heart Surgery;  Laterality: N/A;  .  TUBAL LIGATION    . VAGINAL HYSTERECTOMY  1980's    Social History   Socioeconomic History  . Marital status: Widowed    Spouse name: Not on file  . Number of children: 10  . Years of education: Not on file  . Highest education level: Not on file  Occupational History  . Not on file  Social Needs  . Financial resource strain: Not on file  . Food insecurity:    Worry: Not on file    Inability: Not on file  . Transportation needs:    Medical: Not on file    Non-medical: Not  on file  Tobacco Use  . Smoking status: Never Smoker  . Smokeless tobacco: Never Used  Substance and Sexual Activity  . Alcohol use: No    Alcohol/week: 0.0 standard drinks  . Drug use: No  . Sexual activity: Yes    Birth control/protection: Surgical  Lifestyle  . Physical activity:    Days per week: Not on file    Minutes per session: Not on file  . Stress: Not on file  Relationships  . Social connections:    Talks on phone: Not on file    Gets together: Not on file    Attends religious service: Not on file    Active member of club or organization: Not on file    Attends meetings of clubs or organizations: Not on file    Relationship status: Not on file  . Intimate partner violence:    Fear of current or ex partner: Not on file    Emotionally abused: Not on file    Physically abused: Not on file    Forced sexual activity: Not on file  Other Topics Concern  . Not on file  Social History Narrative  . Not on file    Family History  Problem Relation Age of Onset  . Anesthesia problems Neg Hx   . Hypotension Neg Hx   . Malignant hyperthermia Neg Hx   . Pseudochol deficiency Neg Hx     ROS: no fevers or chills, productive cough, hemoptysis, dysphasia, odynophagia, melena, hematochezia, dysuria, hematuria, rash, seizure activity, orthopnea, PND, pedal edema, claudication. Remaining systems are negative.  Physical Exam: Well-developed well-nourished in no acute distress.  Skin is warm and dry.  HEENT is normal.  Neck is supple.  Chest is clear to auscultation with normal expansion.  Cardiovascular exam is regular rate and rhythm.  Abdominal exam nontender or distended. No masses palpated. Extremities show no edema. neuro grossly intact  ECG- personally reviewed  A/P  1  Kelly Millers, MD

## 2018-08-05 DIAGNOSIS — Z6820 Body mass index (BMI) 20.0-20.9, adult: Secondary | ICD-10-CM | POA: Diagnosis not present

## 2018-08-05 DIAGNOSIS — E1122 Type 2 diabetes mellitus with diabetic chronic kidney disease: Secondary | ICD-10-CM | POA: Diagnosis not present

## 2018-08-05 DIAGNOSIS — I10 Essential (primary) hypertension: Secondary | ICD-10-CM | POA: Diagnosis not present

## 2018-08-05 DIAGNOSIS — M545 Low back pain: Secondary | ICD-10-CM | POA: Diagnosis not present

## 2018-08-05 DIAGNOSIS — Z299 Encounter for prophylactic measures, unspecified: Secondary | ICD-10-CM | POA: Diagnosis not present

## 2018-08-05 DIAGNOSIS — J309 Allergic rhinitis, unspecified: Secondary | ICD-10-CM | POA: Diagnosis not present

## 2018-08-05 DIAGNOSIS — E1165 Type 2 diabetes mellitus with hyperglycemia: Secondary | ICD-10-CM | POA: Diagnosis not present

## 2018-08-12 ENCOUNTER — Ambulatory Visit: Payer: Medicare HMO | Admitting: Cardiology

## 2018-08-16 ENCOUNTER — Ambulatory Visit: Payer: Medicare HMO | Admitting: Cardiology

## 2018-08-16 ENCOUNTER — Encounter: Payer: Self-pay | Admitting: Cardiology

## 2018-08-16 VITALS — BP 128/72 | HR 64 | Ht 60.0 in | Wt 80.0 lb

## 2018-08-16 DIAGNOSIS — I080 Rheumatic disorders of both mitral and aortic valves: Secondary | ICD-10-CM

## 2018-08-16 DIAGNOSIS — I2510 Atherosclerotic heart disease of native coronary artery without angina pectoris: Secondary | ICD-10-CM

## 2018-08-16 DIAGNOSIS — E7800 Pure hypercholesterolemia, unspecified: Secondary | ICD-10-CM | POA: Diagnosis not present

## 2018-08-16 DIAGNOSIS — I10 Essential (primary) hypertension: Secondary | ICD-10-CM

## 2018-08-16 MED ORDER — ASPIRIN EC 81 MG PO TBEC: 81.0000 mg | DELAYED_RELEASE_TABLET | Freq: Every day | ORAL | Status: AC

## 2018-08-16 NOTE — Patient Instructions (Signed)
Medication Instructions:   DECREASE ASPIRIN TO 81 MG ONCE DAILY  Testing/Procedures:  Your physician has requested that you have an echocardiogram. Echocardiography is a painless test that uses sound waves to create images of your heart. It provides your doctor with information about the size and shape of your heart and how well your heart's chambers and valves are working. This procedure takes approximately one hour. There are no restrictions for this procedure.    Follow-Up:  Your physician wants you to follow-up in: ONE YEAR WITH DR Shelda Pal will receive a reminder letter in the mail two months in advance. If you don't receive a letter, please call our office to schedule the follow-up appointment.   If you need a refill on your cardiac medications before your next appointment, please call your pharmacy.

## 2018-08-16 NOTE — Progress Notes (Signed)
HPI: FU CAD. Echocardiogram September 2016 showed ejection fraction 45-50% and grade 1 diastolic dysfunction. There was moderate mitral regurgitation and mild left atrial enlargement. Trace aortic insufficiency. Cardiac catheterization September 2016 showed 90% ostial LAD and 35% circumflex. She subsequently had coronary artery bypass graft with a LIMA to the LAD. Note preoperative carotid Doppler September 2016 showed 1-39% bilateral stenosis. Since last seen 7/17 the patient denies any dyspnea on exertion, orthopnea, PND, pedal edema, palpitations, syncope or chest pain.   Current Outpatient Medications  Medication Sig Dispense Refill  . aspirin EC 325 MG EC tablet Take 1 tablet (325 mg total) by mouth daily.    Marland Kitchen atorvastatin (LIPITOR) 80 MG tablet Take 1 tablet (80 mg total) by mouth daily. 90 tablet 3  . ferrous fumarate-b12-vitamic C-folic acid (TRINSICON / FOLTRIN) capsule Take 1 capsule by mouth 3 (three) times daily after meals. 30 capsule 1  . glimepiride (AMARYL) 4 MG tablet Take 4 mg by mouth 2 (two) times daily.      Marland Kitchen lisinopril (PRINIVIL,ZESTRIL) 10 MG tablet TAKE 1 TABLET DAILY 90 tablet 0  . metFORMIN (GLUCOPHAGE) 500 MG tablet Take 500 mg by mouth daily.    . metoprolol tartrate (LOPRESSOR) 25 MG tablet Take 1 tablet (25 mg total) by mouth 2 (two) times daily. Call office to make appointment for further refills. 30 tablet 0  . naproxen sodium (ALEVE) 220 MG tablet Take 220 mg by mouth as needed.    Marland Kitchen NITROSTAT 0.4 MG SL tablet Take 0.4 mg by mouth every 5 (five) minutes as needed for chest pain.      No current facility-administered medications for this visit.      Past Medical History:  Diagnosis Date  . Anemia   . GERD (gastroesophageal reflux disease)   . HTN (hypertension)   . Hypercholesterolemia   . LBBB (left bundle branch block)   . Lumbar disc disease   . Non-ischemic cardiomyopathy (HCC)   . Poor short term memory   . Type II diabetes mellitus (HCC)      Past Surgical History:  Procedure Laterality Date  . APPENDECTOMY  1970's   [place]  . CARDIAC CATHETERIZATION N/A 08/08/2015   Procedure: Left Heart Cath and Coronary Angiography;  Surgeon: Lyn Records, MD;  Location: [facility] INVASIVE CV LAB;  Service: Cardiovascular;  Laterality: N/A;  . CARDIAC CATHETERIZATION  01/31/2014  . CATARACT EXTRACTION W/ INTRAOCULAR LENS IMPLANT Right   . CATARACT EXTRACTION W/PHACO  07/24/2011   Procedure: CATARACT EXTRACTION PHACO AND INTRAOCULAR LENS PLACEMENT (IOC);  Surgeon: Gemma Payor;  Location: [facility] ORS;  Service: Ophthalmology;  Laterality: Left;  CDE: 14.73  . CORONARY ARTERY BYPASS GRAFT N/A 08/10/2015   Procedure: OFF PUMP CORONARY ARTERY BYPASS GRAFTING (CABG) UTILIZING THE LEFT INTERNAL MAMMARY ARTERY;  Surgeon: Loreli Slot, MD;  Location: [facility] OR;  Service: Open Heart Surgery;  Laterality: N/A;  . DILATION AND CURETTAGE OF UTERUS    . EYE SURGERY Bilateral    "laser; related to diabetes"  . FRACTURE SURGERY    . LEFT HEART CATHETERIZATION WITH CORONARY ANGIOGRAM N/A 01/31/2014   Procedure: LEFT HEART CATHETERIZATION WITH CORONARY ANGIOGRAM;  Surgeon: Peter M Swaziland, MD;  Location: [facility] CATH LAB;  Service: Cardiovascular;  Laterality: N/A;  . ORIF ANKLE FRACTURE Right 1980's  . TEE WITHOUT CARDIOVERSION N/A 08/10/2015   Procedure: TRANSESOPHAGEAL ECHOCARDIOGRAM (TEE);  Surgeon: Loreli Slot, MD;  Location: [facility] OR;  Service: Open Heart Surgery;  Laterality: N/A;  .  TUBAL LIGATION    . VAGINAL HYSTERECTOMY  1980's    Social History   Socioeconomic History  . Marital status: Widowed    Spouse name: Not on file  . Number of children: 10  . Years of education: Not on file  . Highest education level: Not on file  Occupational History  . Not on file  Social Needs  . Financial resource strain: Not on file  . Food insecurity:    Worry: Not on file    Inability: Not on file  . Transportation needs:    Medical: Not on file     Non-medical: Not on file  Tobacco Use  . Smoking status: Never Smoker  . Smokeless tobacco: Never Used  Substance and Sexual Activity  . Alcohol use: No    Alcohol/week: 0.0 standard drinks  . Drug use: No  . Sexual activity: Yes    Birth control/protection: Surgical  Lifestyle  . Physical activity:    Days per week: Not on file    Minutes per session: Not on file  . Stress: Not on file  Relationships  . Social connections:    Talks on phone: Not on file    Gets together: Not on file    Attends religious service: Not on file    Active member of club or organization: Not on file    Attends meetings of clubs or organizations: Not on file    Relationship status: Not on file  . Intimate partner violence:    Fear of current or ex partner: Not on file    Emotionally abused: Not on file    Physically abused: Not on file    Forced sexual activity: Not on file  Other Topics Concern  . Not on file  Social History Narrative  . Not on file    Family History  Problem Relation Age of Onset  . Anesthesia problems Neg Hx   . Hypotension Neg Hx   . Malignant hyperthermia Neg Hx   . Pseudochol deficiency Neg Hx     ROS: no fevers or chills, productive cough, hemoptysis, dysphasia, odynophagia, melena, hematochezia, dysuria, hematuria, rash, seizure activity, orthopnea, PND, pedal edema, claudication. Remaining systems are negative.  Physical Exam: Well-developed well-nourished in no acute distress.  Skin is warm and dry.  HEENT is normal.  Neck is supple.  Chest is clear to auscultation with normal expansion.  Cardiovascular exam is regular rate and rhythm.  Abdominal exam nontender or distended. No masses palpated. Extremities show no edema. neuro grossly intact  ECG-sinus rhythm at a rate of 64.  Left bundle branch block.  Personally reviewed  A/P  1 coronary artery disease-patient denies chest pain.  Continue medical therapy with aspirin (decrease to 81 mg daily) and  statin.  2 history of moderate mitral regurgitation-repeat echocardiogram.  3 hypertension-patient's blood pressure is controlled.    4 hyperlipidemia-continue statin.  I will have most recent lipids and liver forwarded to [place] from primary care.  Olga Millers, MD

## 2018-08-19 DIAGNOSIS — E119 Type 2 diabetes mellitus without complications: Secondary | ICD-10-CM | POA: Diagnosis not present

## 2018-08-19 DIAGNOSIS — I10 Essential (primary) hypertension: Secondary | ICD-10-CM | POA: Diagnosis not present

## 2018-08-24 DIAGNOSIS — M9903 Segmental and somatic dysfunction of lumbar region: Secondary | ICD-10-CM | POA: Diagnosis not present

## 2018-08-24 DIAGNOSIS — S338XXA Sprain of other parts of lumbar spine and pelvis, initial encounter: Secondary | ICD-10-CM | POA: Diagnosis not present

## 2018-08-24 DIAGNOSIS — M47816 Spondylosis without myelopathy or radiculopathy, lumbar region: Secondary | ICD-10-CM | POA: Diagnosis not present

## 2018-08-30 ENCOUNTER — Other Ambulatory Visit: Payer: Self-pay

## 2018-08-30 ENCOUNTER — Ambulatory Visit (HOSPITAL_COMMUNITY): Payer: Medicare HMO | Attending: Cardiology

## 2018-08-30 DIAGNOSIS — M7061 Trochanteric bursitis, right hip: Secondary | ICD-10-CM | POA: Diagnosis not present

## 2018-08-30 DIAGNOSIS — M5416 Radiculopathy, lumbar region: Secondary | ICD-10-CM | POA: Diagnosis not present

## 2018-08-30 DIAGNOSIS — I080 Rheumatic disorders of both mitral and aortic valves: Secondary | ICD-10-CM | POA: Insufficient documentation

## 2018-08-30 DIAGNOSIS — M545 Low back pain: Secondary | ICD-10-CM | POA: Diagnosis not present

## 2018-09-14 DIAGNOSIS — M25551 Pain in right hip: Secondary | ICD-10-CM | POA: Diagnosis not present

## 2018-09-14 DIAGNOSIS — Z2821 Immunization not carried out because of patient refusal: Secondary | ICD-10-CM | POA: Diagnosis not present

## 2018-09-14 DIAGNOSIS — E1165 Type 2 diabetes mellitus with hyperglycemia: Secondary | ICD-10-CM | POA: Diagnosis not present

## 2018-09-14 DIAGNOSIS — Z299 Encounter for prophylactic measures, unspecified: Secondary | ICD-10-CM | POA: Diagnosis not present

## 2018-09-14 DIAGNOSIS — Z6820 Body mass index (BMI) 20.0-20.9, adult: Secondary | ICD-10-CM | POA: Diagnosis not present

## 2018-09-14 DIAGNOSIS — N183 Chronic kidney disease, stage 3 (moderate): Secondary | ICD-10-CM | POA: Diagnosis not present

## 2018-09-14 DIAGNOSIS — E1122 Type 2 diabetes mellitus with diabetic chronic kidney disease: Secondary | ICD-10-CM | POA: Diagnosis not present

## 2018-10-21 DIAGNOSIS — M9903 Segmental and somatic dysfunction of lumbar region: Secondary | ICD-10-CM | POA: Diagnosis not present

## 2018-10-21 DIAGNOSIS — M47816 Spondylosis without myelopathy or radiculopathy, lumbar region: Secondary | ICD-10-CM | POA: Diagnosis not present

## 2018-10-21 DIAGNOSIS — S338XXA Sprain of other parts of lumbar spine and pelvis, initial encounter: Secondary | ICD-10-CM | POA: Diagnosis not present

## 2018-10-22 DIAGNOSIS — I10 Essential (primary) hypertension: Secondary | ICD-10-CM | POA: Diagnosis not present

## 2018-10-22 DIAGNOSIS — E119 Type 2 diabetes mellitus without complications: Secondary | ICD-10-CM | POA: Diagnosis not present

## 2018-10-26 DIAGNOSIS — M9903 Segmental and somatic dysfunction of lumbar region: Secondary | ICD-10-CM | POA: Diagnosis not present

## 2018-10-26 DIAGNOSIS — S338XXA Sprain of other parts of lumbar spine and pelvis, initial encounter: Secondary | ICD-10-CM | POA: Diagnosis not present

## 2018-10-26 DIAGNOSIS — M47816 Spondylosis without myelopathy or radiculopathy, lumbar region: Secondary | ICD-10-CM | POA: Diagnosis not present

## 2018-11-01 DIAGNOSIS — S338XXA Sprain of other parts of lumbar spine and pelvis, initial encounter: Secondary | ICD-10-CM | POA: Diagnosis not present

## 2018-11-01 DIAGNOSIS — M9903 Segmental and somatic dysfunction of lumbar region: Secondary | ICD-10-CM | POA: Diagnosis not present

## 2018-11-01 DIAGNOSIS — M47816 Spondylosis without myelopathy or radiculopathy, lumbar region: Secondary | ICD-10-CM | POA: Diagnosis not present

## 2018-11-08 DIAGNOSIS — M9903 Segmental and somatic dysfunction of lumbar region: Secondary | ICD-10-CM | POA: Diagnosis not present

## 2018-11-08 DIAGNOSIS — M47816 Spondylosis without myelopathy or radiculopathy, lumbar region: Secondary | ICD-10-CM | POA: Diagnosis not present

## 2018-11-08 DIAGNOSIS — S338XXA Sprain of other parts of lumbar spine and pelvis, initial encounter: Secondary | ICD-10-CM | POA: Diagnosis not present

## 2018-11-12 DIAGNOSIS — Z789 Other specified health status: Secondary | ICD-10-CM | POA: Diagnosis not present

## 2018-11-12 DIAGNOSIS — E1122 Type 2 diabetes mellitus with diabetic chronic kidney disease: Secondary | ICD-10-CM | POA: Diagnosis not present

## 2018-11-12 DIAGNOSIS — Z6820 Body mass index (BMI) 20.0-20.9, adult: Secondary | ICD-10-CM | POA: Diagnosis not present

## 2018-11-12 DIAGNOSIS — N183 Chronic kidney disease, stage 3 (moderate): Secondary | ICD-10-CM | POA: Diagnosis not present

## 2018-11-12 DIAGNOSIS — Z299 Encounter for prophylactic measures, unspecified: Secondary | ICD-10-CM | POA: Diagnosis not present

## 2018-11-12 DIAGNOSIS — E1165 Type 2 diabetes mellitus with hyperglycemia: Secondary | ICD-10-CM | POA: Diagnosis not present

## 2018-11-12 DIAGNOSIS — I10 Essential (primary) hypertension: Secondary | ICD-10-CM | POA: Diagnosis not present

## 2018-12-13 DIAGNOSIS — I10 Essential (primary) hypertension: Secondary | ICD-10-CM | POA: Diagnosis not present

## 2018-12-13 DIAGNOSIS — E119 Type 2 diabetes mellitus without complications: Secondary | ICD-10-CM | POA: Diagnosis not present

## 2018-12-15 DIAGNOSIS — E1122 Type 2 diabetes mellitus with diabetic chronic kidney disease: Secondary | ICD-10-CM | POA: Diagnosis not present

## 2018-12-15 DIAGNOSIS — Z789 Other specified health status: Secondary | ICD-10-CM | POA: Diagnosis not present

## 2018-12-15 DIAGNOSIS — Z299 Encounter for prophylactic measures, unspecified: Secondary | ICD-10-CM | POA: Diagnosis not present

## 2018-12-15 DIAGNOSIS — R413 Other amnesia: Secondary | ICD-10-CM | POA: Diagnosis not present

## 2018-12-15 DIAGNOSIS — Z681 Body mass index (BMI) 19 or less, adult: Secondary | ICD-10-CM | POA: Diagnosis not present

## 2018-12-15 DIAGNOSIS — I10 Essential (primary) hypertension: Secondary | ICD-10-CM | POA: Diagnosis not present

## 2018-12-15 DIAGNOSIS — E1165 Type 2 diabetes mellitus with hyperglycemia: Secondary | ICD-10-CM | POA: Diagnosis not present

## 2019-01-05 DIAGNOSIS — R413 Other amnesia: Secondary | ICD-10-CM | POA: Diagnosis not present

## 2019-01-10 DIAGNOSIS — I10 Essential (primary) hypertension: Secondary | ICD-10-CM | POA: Diagnosis not present

## 2019-01-10 DIAGNOSIS — E119 Type 2 diabetes mellitus without complications: Secondary | ICD-10-CM | POA: Diagnosis not present

## 2019-01-13 DIAGNOSIS — I10 Essential (primary) hypertension: Secondary | ICD-10-CM | POA: Diagnosis not present

## 2019-01-13 DIAGNOSIS — E1165 Type 2 diabetes mellitus with hyperglycemia: Secondary | ICD-10-CM | POA: Diagnosis not present

## 2019-01-13 DIAGNOSIS — N183 Chronic kidney disease, stage 3 (moderate): Secondary | ICD-10-CM | POA: Diagnosis not present

## 2019-01-13 DIAGNOSIS — R413 Other amnesia: Secondary | ICD-10-CM | POA: Diagnosis not present

## 2019-01-13 DIAGNOSIS — Z299 Encounter for prophylactic measures, unspecified: Secondary | ICD-10-CM | POA: Diagnosis not present

## 2019-01-13 DIAGNOSIS — Z681 Body mass index (BMI) 19 or less, adult: Secondary | ICD-10-CM | POA: Diagnosis not present

## 2019-01-13 DIAGNOSIS — E7800 Pure hypercholesterolemia, unspecified: Secondary | ICD-10-CM | POA: Diagnosis not present

## 2019-02-23 DIAGNOSIS — E119 Type 2 diabetes mellitus without complications: Secondary | ICD-10-CM | POA: Diagnosis not present

## 2019-02-23 DIAGNOSIS — I10 Essential (primary) hypertension: Secondary | ICD-10-CM | POA: Diagnosis not present

## 2019-03-22 DIAGNOSIS — I10 Essential (primary) hypertension: Secondary | ICD-10-CM | POA: Diagnosis not present

## 2019-03-22 DIAGNOSIS — E119 Type 2 diabetes mellitus without complications: Secondary | ICD-10-CM | POA: Diagnosis not present

## 2019-05-24 DIAGNOSIS — I10 Essential (primary) hypertension: Secondary | ICD-10-CM | POA: Diagnosis not present

## 2019-05-24 DIAGNOSIS — E119 Type 2 diabetes mellitus without complications: Secondary | ICD-10-CM | POA: Diagnosis not present

## 2019-06-13 DIAGNOSIS — I639 Cerebral infarction, unspecified: Secondary | ICD-10-CM | POA: Diagnosis not present

## 2019-06-13 DIAGNOSIS — G459 Transient cerebral ischemic attack, unspecified: Secondary | ICD-10-CM | POA: Diagnosis not present

## 2019-06-13 DIAGNOSIS — Z209 Contact with and (suspected) exposure to unspecified communicable disease: Secondary | ICD-10-CM | POA: Diagnosis not present

## 2019-06-13 DIAGNOSIS — R29818 Other symptoms and signs involving the nervous system: Secondary | ICD-10-CM | POA: Diagnosis not present

## 2019-06-13 DIAGNOSIS — I4510 Unspecified right bundle-branch block: Secondary | ICD-10-CM | POA: Diagnosis not present

## 2019-06-13 DIAGNOSIS — I10 Essential (primary) hypertension: Secondary | ICD-10-CM | POA: Diagnosis not present

## 2019-06-13 DIAGNOSIS — G8191 Hemiplegia, unspecified affecting right dominant side: Secondary | ICD-10-CM | POA: Diagnosis not present

## 2019-06-13 DIAGNOSIS — E119 Type 2 diabetes mellitus without complications: Secondary | ICD-10-CM | POA: Diagnosis not present

## 2019-06-13 DIAGNOSIS — R0689 Other abnormalities of breathing: Secondary | ICD-10-CM | POA: Diagnosis not present

## 2019-06-13 DIAGNOSIS — R531 Weakness: Secondary | ICD-10-CM | POA: Diagnosis not present

## 2019-06-14 DIAGNOSIS — I6782 Cerebral ischemia: Secondary | ICD-10-CM | POA: Diagnosis not present

## 2019-06-14 DIAGNOSIS — I10 Essential (primary) hypertension: Secondary | ICD-10-CM | POA: Diagnosis not present

## 2019-06-14 DIAGNOSIS — G8191 Hemiplegia, unspecified affecting right dominant side: Secondary | ICD-10-CM | POA: Diagnosis not present

## 2019-06-14 DIAGNOSIS — I639 Cerebral infarction, unspecified: Secondary | ICD-10-CM | POA: Diagnosis not present

## 2019-06-14 DIAGNOSIS — I619 Nontraumatic intracerebral hemorrhage, unspecified: Secondary | ICD-10-CM | POA: Diagnosis not present

## 2019-06-14 DIAGNOSIS — E119 Type 2 diabetes mellitus without complications: Secondary | ICD-10-CM | POA: Diagnosis not present

## 2019-06-15 DIAGNOSIS — I10 Essential (primary) hypertension: Secondary | ICD-10-CM | POA: Diagnosis not present

## 2019-06-15 DIAGNOSIS — Z7984 Long term (current) use of oral hypoglycemic drugs: Secondary | ICD-10-CM | POA: Diagnosis not present

## 2019-06-15 DIAGNOSIS — E119 Type 2 diabetes mellitus without complications: Secondary | ICD-10-CM | POA: Diagnosis not present

## 2019-06-15 DIAGNOSIS — I639 Cerebral infarction, unspecified: Secondary | ICD-10-CM | POA: Diagnosis not present

## 2019-06-15 DIAGNOSIS — G8191 Hemiplegia, unspecified affecting right dominant side: Secondary | ICD-10-CM | POA: Diagnosis not present

## 2019-06-16 DIAGNOSIS — G8191 Hemiplegia, unspecified affecting right dominant side: Secondary | ICD-10-CM | POA: Diagnosis not present

## 2019-06-16 DIAGNOSIS — E119 Type 2 diabetes mellitus without complications: Secondary | ICD-10-CM | POA: Diagnosis not present

## 2019-06-16 DIAGNOSIS — I639 Cerebral infarction, unspecified: Secondary | ICD-10-CM | POA: Diagnosis not present

## 2019-06-16 DIAGNOSIS — I10 Essential (primary) hypertension: Secondary | ICD-10-CM | POA: Diagnosis not present

## 2019-06-17 ENCOUNTER — Telehealth: Payer: Self-pay | Admitting: Cardiology

## 2019-06-17 NOTE — Telephone Encounter (Signed)

## 2019-06-20 ENCOUNTER — Encounter: Payer: Self-pay | Admitting: Cardiology

## 2019-06-20 ENCOUNTER — Telehealth (INDEPENDENT_AMBULATORY_CARE_PROVIDER_SITE_OTHER): Payer: Medicare HMO | Admitting: Cardiology

## 2019-06-20 ENCOUNTER — Other Ambulatory Visit: Payer: Self-pay

## 2019-06-20 VITALS — BP 119/71 | HR 85 | Ht 59.0 in | Wt 104.0 lb

## 2019-06-20 DIAGNOSIS — I080 Rheumatic disorders of both mitral and aortic valves: Secondary | ICD-10-CM | POA: Diagnosis not present

## 2019-06-20 DIAGNOSIS — E7800 Pure hypercholesterolemia, unspecified: Secondary | ICD-10-CM

## 2019-06-20 DIAGNOSIS — I2510 Atherosclerotic heart disease of native coronary artery without angina pectoris: Secondary | ICD-10-CM

## 2019-06-20 DIAGNOSIS — I10 Essential (primary) hypertension: Secondary | ICD-10-CM

## 2019-06-20 NOTE — Patient Instructions (Signed)
Medication Instructions:  NO CHANGE If you need a refill on your cardiac medications before your next appointment, please call your pharmacy.   Lab work: If you have labs (blood work) drawn today and your tests are completely normal, you will receive your results only by: Marland Kitchen MyChart Message (if you have MyChart) OR . A paper copy in the mail If you have any lab test that is abnormal or we need to change your treatment, we will call you to review the results.  Follow-Up: At [facility], you and your health needs are our priority.  As part of our continuing mission to provide you with exceptional heart care, we have created designated Provider Care Teams.  These Care Teams include your primary Cardiologist (physician) and Advanced Practice Providers (APPs -  Physician Assistants and Nurse Practitioners) who all work together to provide you with the care you need, when you need it. . Your physician recommends that you schedule a follow-up appointment in: [place]

## 2019-06-20 NOTE — Progress Notes (Signed)
Virtual Visit via Video Note changed to phone visit at patient request.   This visit type was conducted due to national recommendations for restrictions regarding the COVID-19 Pandemic (e.g. social distancing) in an effort to limit this patient's exposure and mitigate transmission in our community.  Due to her co-morbid illnesses, this patient is at least at moderate risk for complications without adequate follow up.  This format is felt to be most appropriate for this patient at this time.  All issues noted in this document were discussed and addressed.  A limited physical exam was performed with this format.  Please refer to the patient's chart for her consent to telehealth for [facility].   Date:  06/20/2019   ID:  Kelly Castillo, DOB 01-14-1934, MRN [id number]  Patient Location:Home Provider Location: Home  PCP:  Kirstie Peri, MD  Cardiologist:  Dr Jens Som  Evaluation Performed:  Follow-Up Visit  Chief Complaint:  FU CAD  History of Present Illness:    FU CAD. Cardiac catheterization September 2016 showed 90% ostial LAD and 35% circumflex. She subsequently had coronary artery bypass graft with a LIMA to the LAD. Note preoperative carotid Doppler September 2016 showed 1-39% bilateral stenosis.  Echocardiogram repeated October 2019 and showed normal LV function, mild diastolic dysfunction, mild aortic insufficiency moderate mitral and tricuspid regurgitation. Apparently patient recently hospitalized at [facility] with CVA.  No records available.  Since last seen  patient denies dyspnea, chest pain or syncope.  No clear palpitations.  She has residual right-sided weakness from recent CVA.  The patient does not have symptoms concerning for COVID-19 infection (fever, chills, cough, or new shortness of breath).    Past Medical History:  Diagnosis Date  . Anemia   . GERD (gastroesophageal reflux disease)   . HTN (hypertension)   . Hypercholesterolemia   . LBBB (left bundle  branch block)   . Lumbar disc disease   . Non-ischemic cardiomyopathy (HCC)   . Poor short term memory   . Type II diabetes mellitus (HCC)    Past Surgical History:  Procedure Laterality Date  . APPENDECTOMY  1970's   [place]  . CARDIAC CATHETERIZATION N/A 08/08/2015   Procedure: Left Heart Cath and Coronary Angiography;  Surgeon: Lyn Records, MD;  Location: [facility] INVASIVE CV LAB;  Service: Cardiovascular;  Laterality: N/A;  . CARDIAC CATHETERIZATION  01/31/2014  . CATARACT EXTRACTION W/ INTRAOCULAR LENS IMPLANT Right   . CATARACT EXTRACTION W/PHACO  07/24/2011   Procedure: CATARACT EXTRACTION PHACO AND INTRAOCULAR LENS PLACEMENT (IOC);  Surgeon: Gemma Payor;  Location: [facility] ORS;  Service: Ophthalmology;  Laterality: Left;  CDE: 14.73  . CORONARY ARTERY BYPASS GRAFT N/A 08/10/2015   Procedure: OFF PUMP CORONARY ARTERY BYPASS GRAFTING (CABG) UTILIZING THE LEFT INTERNAL MAMMARY ARTERY;  Surgeon: Loreli Slot, MD;  Location: [facility] OR;  Service: Open Heart Surgery;  Laterality: N/A;  . DILATION AND CURETTAGE OF UTERUS    . EYE SURGERY Bilateral    "laser; related to diabetes"  . FRACTURE SURGERY    . LEFT HEART CATHETERIZATION WITH CORONARY ANGIOGRAM N/A 01/31/2014   Procedure: LEFT HEART CATHETERIZATION WITH CORONARY ANGIOGRAM;  Surgeon: Peter M Swaziland, MD;  Location: [facility] CATH LAB;  Service: Cardiovascular;  Laterality: N/A;  . ORIF ANKLE FRACTURE Right 1980's  . TEE WITHOUT CARDIOVERSION N/A 08/10/2015   Procedure: TRANSESOPHAGEAL ECHOCARDIOGRAM (TEE);  Surgeon: Loreli Slot, MD;  Location: [facility] OR;  Service: Open Heart Surgery;  Laterality: N/A;  . TUBAL LIGATION    .  VAGINAL HYSTERECTOMY  1980's     Current Meds  Medication Sig  . aspirin 81 MG tablet Take 1 tablet (81 mg total) by mouth daily.  Marland Kitchen atorvastatin (LIPITOR) 80 MG tablet Take 1 tablet (80 mg total) by mouth daily.  . clopidogrel (PLAVIX) 75 MG tablet Take 75 mg by mouth daily.  Marland Kitchen glimepiride (AMARYL) 4 MG tablet  Take 4 mg by mouth daily with breakfast.   . lisinopril (PRINIVIL,ZESTRIL) 10 MG tablet TAKE 1 TABLET DAILY  . metFORMIN (GLUCOPHAGE) 500 MG tablet Take 500 mg by mouth daily.  . metoprolol tartrate (LOPRESSOR) 25 MG tablet Take 1 tablet (25 mg total) by mouth 2 (two) times daily. Call office to make appointment for further refills.  . naproxen sodium (ALEVE) 220 MG tablet Take 220 mg by mouth as needed.  Marland Kitchen NITROSTAT 0.4 MG SL tablet Take 0.4 mg by mouth every 5 (five) minutes as needed for chest pain.      Allergies:   Patient has no known allergies.   Social History   Tobacco Use  . Smoking status: Never Smoker  . Smokeless tobacco: Never Used  Substance Use Topics  . Alcohol use: No    Alcohol/week: 0.0 standard drinks  . Drug use: No     Family Hx: The patient's family history is negative for Anesthesia problems, Hypotension, Malignant hyperthermia, and Pseudochol deficiency.  ROS:   Please see the history of present illness.    No Fever, chills  or productive cough All other systems reviewed and are negative.  Recent Lipid Panel Lab Results  Component Value Date/Time   CHOL 150 08/05/2016 10:57 AM   TRIG 73 08/05/2016 10:57 AM   HDL 64 08/05/2016 10:57 AM   CHOLHDL 2.3 08/05/2016 10:57 AM   LDLCALC 71 08/05/2016 10:57 AM    Wt Readings from Last 3 Encounters:  06/20/19 104 lb (47.2 kg)  08/16/18 80 lb (36.3 kg)  06/06/16 106 lb 3.2 oz (48.2 kg)     Objective:    Vital Signs:  BP 119/71   Pulse 85   Ht 4\' 11"  (1.499 m)   Wt 104 lb (47.2 kg)   BMI 21.01 kg/m    VITAL SIGNS:  reviewed NAD Answers questions appropriately Normal affect Remainder of physical examination not performed (telehealth visit; coronavirus pandemic)  ASSESSMENT & PLAN:    1. Coronary artery disease-patient is not having chest pain at present.  Continue medical therapy with aspirin and statin. 2. Moderate mitral regurgitation-she will need follow-up echocardiograms in the future.  3. Hypertension-patient's blood pressure is well controlled today.  Continue present medications and follow. 4. Hyperlipidemia-continue statin. 5. Recent CVA-we will obtain all records from [facility]; I will review.  She may need an implantable loop monitor to rule out atrial fibrillation.  COVID-19 Education: The importance of social distancing was discussed today.  Time:   Today, I have spent 17 minutes with the patient with telehealth technology discussing the above problems.     Medication Adjustments/Labs and Tests Ordered: Current medicines are reviewed at length with the patient today.  Concerns regarding medicines are outlined above.   Tests Ordered: No orders of the defined types were placed in this encounter.   Medication Changes: No orders of the defined types were placed in this encounter.   Follow Up:  Virtual Visit or In Person in 2 week(s)  Signed, Kirk Ruths, MD  06/20/2019 8:27 AM    [place]

## 2019-06-21 DIAGNOSIS — M6281 Muscle weakness (generalized): Secondary | ICD-10-CM | POA: Diagnosis not present

## 2019-06-21 DIAGNOSIS — Z8673 Personal history of transient ischemic attack (TIA), and cerebral infarction without residual deficits: Secondary | ICD-10-CM | POA: Diagnosis not present

## 2019-06-21 DIAGNOSIS — R262 Difficulty in walking, not elsewhere classified: Secondary | ICD-10-CM | POA: Diagnosis not present

## 2019-06-21 DIAGNOSIS — R278 Other lack of coordination: Secondary | ICD-10-CM | POA: Diagnosis not present

## 2019-06-21 DIAGNOSIS — R269 Unspecified abnormalities of gait and mobility: Secondary | ICD-10-CM | POA: Diagnosis not present

## 2019-06-24 DIAGNOSIS — I639 Cerebral infarction, unspecified: Secondary | ICD-10-CM | POA: Diagnosis not present

## 2019-06-24 DIAGNOSIS — Z681 Body mass index (BMI) 19 or less, adult: Secondary | ICD-10-CM | POA: Diagnosis not present

## 2019-06-24 DIAGNOSIS — E1165 Type 2 diabetes mellitus with hyperglycemia: Secondary | ICD-10-CM | POA: Diagnosis not present

## 2019-06-24 DIAGNOSIS — R262 Difficulty in walking, not elsewhere classified: Secondary | ICD-10-CM | POA: Diagnosis not present

## 2019-06-24 DIAGNOSIS — E1122 Type 2 diabetes mellitus with diabetic chronic kidney disease: Secondary | ICD-10-CM | POA: Diagnosis not present

## 2019-06-24 DIAGNOSIS — R269 Unspecified abnormalities of gait and mobility: Secondary | ICD-10-CM | POA: Diagnosis not present

## 2019-06-24 DIAGNOSIS — R278 Other lack of coordination: Secondary | ICD-10-CM | POA: Diagnosis not present

## 2019-06-24 DIAGNOSIS — Z299 Encounter for prophylactic measures, unspecified: Secondary | ICD-10-CM | POA: Diagnosis not present

## 2019-06-24 DIAGNOSIS — Z8673 Personal history of transient ischemic attack (TIA), and cerebral infarction without residual deficits: Secondary | ICD-10-CM | POA: Diagnosis not present

## 2019-06-24 DIAGNOSIS — I10 Essential (primary) hypertension: Secondary | ICD-10-CM | POA: Diagnosis not present

## 2019-06-24 DIAGNOSIS — M6281 Muscle weakness (generalized): Secondary | ICD-10-CM | POA: Diagnosis not present

## 2019-06-27 DIAGNOSIS — R269 Unspecified abnormalities of gait and mobility: Secondary | ICD-10-CM | POA: Diagnosis not present

## 2019-06-27 DIAGNOSIS — Z8673 Personal history of transient ischemic attack (TIA), and cerebral infarction without residual deficits: Secondary | ICD-10-CM | POA: Diagnosis not present

## 2019-06-27 DIAGNOSIS — M6281 Muscle weakness (generalized): Secondary | ICD-10-CM | POA: Diagnosis not present

## 2019-06-27 DIAGNOSIS — R278 Other lack of coordination: Secondary | ICD-10-CM | POA: Diagnosis not present

## 2019-06-27 DIAGNOSIS — R262 Difficulty in walking, not elsewhere classified: Secondary | ICD-10-CM | POA: Diagnosis not present

## 2019-07-01 DIAGNOSIS — M6281 Muscle weakness (generalized): Secondary | ICD-10-CM | POA: Diagnosis not present

## 2019-07-01 DIAGNOSIS — R269 Unspecified abnormalities of gait and mobility: Secondary | ICD-10-CM | POA: Diagnosis not present

## 2019-07-01 DIAGNOSIS — Z8673 Personal history of transient ischemic attack (TIA), and cerebral infarction without residual deficits: Secondary | ICD-10-CM | POA: Diagnosis not present

## 2019-07-01 DIAGNOSIS — R278 Other lack of coordination: Secondary | ICD-10-CM | POA: Diagnosis not present

## 2019-07-01 DIAGNOSIS — R262 Difficulty in walking, not elsewhere classified: Secondary | ICD-10-CM | POA: Diagnosis not present

## 2019-07-04 DIAGNOSIS — R269 Unspecified abnormalities of gait and mobility: Secondary | ICD-10-CM | POA: Diagnosis not present

## 2019-07-04 DIAGNOSIS — M6281 Muscle weakness (generalized): Secondary | ICD-10-CM | POA: Diagnosis not present

## 2019-07-04 DIAGNOSIS — I639 Cerebral infarction, unspecified: Secondary | ICD-10-CM | POA: Diagnosis not present

## 2019-07-04 DIAGNOSIS — Z8673 Personal history of transient ischemic attack (TIA), and cerebral infarction without residual deficits: Secondary | ICD-10-CM | POA: Diagnosis not present

## 2019-07-04 DIAGNOSIS — R278 Other lack of coordination: Secondary | ICD-10-CM | POA: Diagnosis not present

## 2019-07-04 DIAGNOSIS — I6350 Cerebral infarction due to unspecified occlusion or stenosis of unspecified cerebral artery: Secondary | ICD-10-CM | POA: Diagnosis not present

## 2019-07-04 DIAGNOSIS — R262 Difficulty in walking, not elsewhere classified: Secondary | ICD-10-CM | POA: Diagnosis not present

## 2019-07-06 DIAGNOSIS — M6281 Muscle weakness (generalized): Secondary | ICD-10-CM | POA: Diagnosis not present

## 2019-07-06 DIAGNOSIS — Z8673 Personal history of transient ischemic attack (TIA), and cerebral infarction without residual deficits: Secondary | ICD-10-CM | POA: Diagnosis not present

## 2019-07-06 DIAGNOSIS — R278 Other lack of coordination: Secondary | ICD-10-CM | POA: Diagnosis not present

## 2019-07-06 DIAGNOSIS — R262 Difficulty in walking, not elsewhere classified: Secondary | ICD-10-CM | POA: Diagnosis not present

## 2019-07-06 DIAGNOSIS — R269 Unspecified abnormalities of gait and mobility: Secondary | ICD-10-CM | POA: Diagnosis not present

## 2019-07-11 ENCOUNTER — Telehealth: Payer: Self-pay | Admitting: *Deleted

## 2019-07-11 DIAGNOSIS — I639 Cerebral infarction, unspecified: Secondary | ICD-10-CM

## 2019-07-11 DIAGNOSIS — Z8673 Personal history of transient ischemic attack (TIA), and cerebral infarction without residual deficits: Secondary | ICD-10-CM | POA: Diagnosis not present

## 2019-07-11 DIAGNOSIS — M6281 Muscle weakness (generalized): Secondary | ICD-10-CM | POA: Diagnosis not present

## 2019-07-11 DIAGNOSIS — R262 Difficulty in walking, not elsewhere classified: Secondary | ICD-10-CM | POA: Diagnosis not present

## 2019-07-11 DIAGNOSIS — R269 Unspecified abnormalities of gait and mobility: Secondary | ICD-10-CM | POA: Diagnosis not present

## 2019-07-11 DIAGNOSIS — R278 Other lack of coordination: Secondary | ICD-10-CM | POA: Diagnosis not present

## 2019-07-11 NOTE — Telephone Encounter (Signed)
[employer] to ship a 30 day cardiac event monitor to the patients daughters home:  Crissie Figures, [address], [place], [place] [address].  Cell [phone], Home phone [phone]. Instructions reviewed briefly as they are included in the monitor kit.

## 2019-07-11 NOTE — Telephone Encounter (Signed)
Dr Stanford Breed reviewed all the records from recent discharge from [place] with pt dtr, patient will have an echo in the [place] office and a 30 day event monitor will be mailed to her daughters home. Follow up scheduled 8 weeks in office to go over testing.

## 2019-07-13 DIAGNOSIS — R278 Other lack of coordination: Secondary | ICD-10-CM | POA: Diagnosis not present

## 2019-07-13 DIAGNOSIS — R269 Unspecified abnormalities of gait and mobility: Secondary | ICD-10-CM | POA: Diagnosis not present

## 2019-07-13 DIAGNOSIS — M6281 Muscle weakness (generalized): Secondary | ICD-10-CM | POA: Diagnosis not present

## 2019-07-13 DIAGNOSIS — Z8673 Personal history of transient ischemic attack (TIA), and cerebral infarction without residual deficits: Secondary | ICD-10-CM | POA: Diagnosis not present

## 2019-07-13 DIAGNOSIS — R262 Difficulty in walking, not elsewhere classified: Secondary | ICD-10-CM | POA: Diagnosis not present

## 2019-07-14 DIAGNOSIS — I10 Essential (primary) hypertension: Secondary | ICD-10-CM | POA: Diagnosis not present

## 2019-07-14 DIAGNOSIS — E119 Type 2 diabetes mellitus without complications: Secondary | ICD-10-CM | POA: Diagnosis not present

## 2019-07-17 DIAGNOSIS — I639 Cerebral infarction, unspecified: Secondary | ICD-10-CM | POA: Diagnosis not present

## 2019-07-18 DIAGNOSIS — M6281 Muscle weakness (generalized): Secondary | ICD-10-CM | POA: Diagnosis not present

## 2019-07-18 DIAGNOSIS — R262 Difficulty in walking, not elsewhere classified: Secondary | ICD-10-CM | POA: Diagnosis not present

## 2019-07-18 DIAGNOSIS — I10 Essential (primary) hypertension: Secondary | ICD-10-CM | POA: Diagnosis not present

## 2019-07-18 DIAGNOSIS — R269 Unspecified abnormalities of gait and mobility: Secondary | ICD-10-CM | POA: Diagnosis not present

## 2019-07-18 DIAGNOSIS — Z8673 Personal history of transient ischemic attack (TIA), and cerebral infarction without residual deficits: Secondary | ICD-10-CM | POA: Diagnosis not present

## 2019-07-18 DIAGNOSIS — R278 Other lack of coordination: Secondary | ICD-10-CM | POA: Diagnosis not present

## 2019-07-20 ENCOUNTER — Telehealth: Payer: Self-pay | Admitting: *Deleted

## 2019-07-20 ENCOUNTER — Ambulatory Visit (INDEPENDENT_AMBULATORY_CARE_PROVIDER_SITE_OTHER): Payer: Medicare HMO

## 2019-07-20 ENCOUNTER — Telehealth: Payer: Self-pay

## 2019-07-20 DIAGNOSIS — Z8673 Personal history of transient ischemic attack (TIA), and cerebral infarction without residual deficits: Secondary | ICD-10-CM | POA: Diagnosis not present

## 2019-07-20 DIAGNOSIS — M6281 Muscle weakness (generalized): Secondary | ICD-10-CM | POA: Diagnosis not present

## 2019-07-20 DIAGNOSIS — I639 Cerebral infarction, unspecified: Secondary | ICD-10-CM | POA: Diagnosis not present

## 2019-07-20 DIAGNOSIS — I4891 Unspecified atrial fibrillation: Secondary | ICD-10-CM

## 2019-07-20 DIAGNOSIS — R269 Unspecified abnormalities of gait and mobility: Secondary | ICD-10-CM | POA: Diagnosis not present

## 2019-07-20 DIAGNOSIS — R262 Difficulty in walking, not elsewhere classified: Secondary | ICD-10-CM | POA: Diagnosis not present

## 2019-07-20 DIAGNOSIS — R278 Other lack of coordination: Secondary | ICD-10-CM | POA: Diagnosis not present

## 2019-07-20 NOTE — Telephone Encounter (Signed)
Assisted daughter over the phone with applying a [employer] 30 day cardiac event monitor to her mother, starting the monitor and sending the baseline recording.

## 2019-07-20 NOTE — Telephone Encounter (Signed)
Pt daughter is calling with frustration about the monitor. She had the monitor for 2 days and do not know what to do with it. Please give the daughter Arman Filter a phone call asap. The cell phone number is [phone]

## 2019-07-20 NOTE — Telephone Encounter (Signed)
Arrangement made to call daughter Arman Filter, [phone] at 3 pm today to assist with step by step instructions to apply monitor given over the phone.

## 2019-07-26 DIAGNOSIS — R278 Other lack of coordination: Secondary | ICD-10-CM | POA: Diagnosis not present

## 2019-07-26 DIAGNOSIS — R269 Unspecified abnormalities of gait and mobility: Secondary | ICD-10-CM | POA: Diagnosis not present

## 2019-07-26 DIAGNOSIS — M6281 Muscle weakness (generalized): Secondary | ICD-10-CM | POA: Diagnosis not present

## 2019-07-26 DIAGNOSIS — R262 Difficulty in walking, not elsewhere classified: Secondary | ICD-10-CM | POA: Diagnosis not present

## 2019-07-26 DIAGNOSIS — Z8673 Personal history of transient ischemic attack (TIA), and cerebral infarction without residual deficits: Secondary | ICD-10-CM | POA: Diagnosis not present

## 2019-07-28 DIAGNOSIS — Z8673 Personal history of transient ischemic attack (TIA), and cerebral infarction without residual deficits: Secondary | ICD-10-CM | POA: Diagnosis not present

## 2019-07-28 DIAGNOSIS — M6281 Muscle weakness (generalized): Secondary | ICD-10-CM | POA: Diagnosis not present

## 2019-07-28 DIAGNOSIS — R262 Difficulty in walking, not elsewhere classified: Secondary | ICD-10-CM | POA: Diagnosis not present

## 2019-07-28 DIAGNOSIS — R269 Unspecified abnormalities of gait and mobility: Secondary | ICD-10-CM | POA: Diagnosis not present

## 2019-07-28 DIAGNOSIS — R278 Other lack of coordination: Secondary | ICD-10-CM | POA: Diagnosis not present

## 2019-08-01 DIAGNOSIS — R269 Unspecified abnormalities of gait and mobility: Secondary | ICD-10-CM | POA: Diagnosis not present

## 2019-08-01 DIAGNOSIS — Z8673 Personal history of transient ischemic attack (TIA), and cerebral infarction without residual deficits: Secondary | ICD-10-CM | POA: Diagnosis not present

## 2019-08-01 DIAGNOSIS — M6281 Muscle weakness (generalized): Secondary | ICD-10-CM | POA: Diagnosis not present

## 2019-08-01 DIAGNOSIS — R278 Other lack of coordination: Secondary | ICD-10-CM | POA: Diagnosis not present

## 2019-08-01 DIAGNOSIS — R262 Difficulty in walking, not elsewhere classified: Secondary | ICD-10-CM | POA: Diagnosis not present

## 2019-08-04 DIAGNOSIS — Z8673 Personal history of transient ischemic attack (TIA), and cerebral infarction without residual deficits: Secondary | ICD-10-CM | POA: Diagnosis not present

## 2019-08-04 DIAGNOSIS — R269 Unspecified abnormalities of gait and mobility: Secondary | ICD-10-CM | POA: Diagnosis not present

## 2019-08-04 DIAGNOSIS — R278 Other lack of coordination: Secondary | ICD-10-CM | POA: Diagnosis not present

## 2019-08-04 DIAGNOSIS — M6281 Muscle weakness (generalized): Secondary | ICD-10-CM | POA: Diagnosis not present

## 2019-08-04 DIAGNOSIS — R262 Difficulty in walking, not elsewhere classified: Secondary | ICD-10-CM | POA: Diagnosis not present

## 2019-08-08 DIAGNOSIS — Z8673 Personal history of transient ischemic attack (TIA), and cerebral infarction without residual deficits: Secondary | ICD-10-CM | POA: Diagnosis not present

## 2019-08-08 DIAGNOSIS — R278 Other lack of coordination: Secondary | ICD-10-CM | POA: Diagnosis not present

## 2019-08-08 DIAGNOSIS — R262 Difficulty in walking, not elsewhere classified: Secondary | ICD-10-CM | POA: Diagnosis not present

## 2019-08-08 DIAGNOSIS — R269 Unspecified abnormalities of gait and mobility: Secondary | ICD-10-CM | POA: Diagnosis not present

## 2019-08-08 DIAGNOSIS — M6281 Muscle weakness (generalized): Secondary | ICD-10-CM | POA: Diagnosis not present

## 2019-08-09 DIAGNOSIS — Z1339 Encounter for screening examination for other mental health and behavioral disorders: Secondary | ICD-10-CM | POA: Diagnosis not present

## 2019-08-09 DIAGNOSIS — I10 Essential (primary) hypertension: Secondary | ICD-10-CM | POA: Diagnosis not present

## 2019-08-09 DIAGNOSIS — E1165 Type 2 diabetes mellitus with hyperglycemia: Secondary | ICD-10-CM | POA: Diagnosis not present

## 2019-08-09 DIAGNOSIS — Z0000 Encounter for general adult medical examination without abnormal findings: Secondary | ICD-10-CM | POA: Diagnosis not present

## 2019-08-09 DIAGNOSIS — Z1211 Encounter for screening for malignant neoplasm of colon: Secondary | ICD-10-CM | POA: Diagnosis not present

## 2019-08-09 DIAGNOSIS — E7800 Pure hypercholesterolemia, unspecified: Secondary | ICD-10-CM | POA: Diagnosis not present

## 2019-08-09 DIAGNOSIS — Z7189 Other specified counseling: Secondary | ICD-10-CM | POA: Diagnosis not present

## 2019-08-09 DIAGNOSIS — Z1331 Encounter for screening for depression: Secondary | ICD-10-CM | POA: Diagnosis not present

## 2019-08-09 DIAGNOSIS — E559 Vitamin D deficiency, unspecified: Secondary | ICD-10-CM | POA: Diagnosis not present

## 2019-08-09 DIAGNOSIS — R5383 Other fatigue: Secondary | ICD-10-CM | POA: Diagnosis not present

## 2019-08-10 DIAGNOSIS — I10 Essential (primary) hypertension: Secondary | ICD-10-CM | POA: Diagnosis not present

## 2019-08-10 DIAGNOSIS — E119 Type 2 diabetes mellitus without complications: Secondary | ICD-10-CM | POA: Diagnosis not present

## 2019-08-11 DIAGNOSIS — R269 Unspecified abnormalities of gait and mobility: Secondary | ICD-10-CM | POA: Diagnosis not present

## 2019-08-11 DIAGNOSIS — R262 Difficulty in walking, not elsewhere classified: Secondary | ICD-10-CM | POA: Diagnosis not present

## 2019-08-11 DIAGNOSIS — Z8673 Personal history of transient ischemic attack (TIA), and cerebral infarction without residual deficits: Secondary | ICD-10-CM | POA: Diagnosis not present

## 2019-08-11 DIAGNOSIS — R278 Other lack of coordination: Secondary | ICD-10-CM | POA: Diagnosis not present

## 2019-08-11 DIAGNOSIS — M6281 Muscle weakness (generalized): Secondary | ICD-10-CM | POA: Diagnosis not present

## 2019-08-15 DIAGNOSIS — Z8673 Personal history of transient ischemic attack (TIA), and cerebral infarction without residual deficits: Secondary | ICD-10-CM | POA: Diagnosis not present

## 2019-08-15 DIAGNOSIS — M6281 Muscle weakness (generalized): Secondary | ICD-10-CM | POA: Diagnosis not present

## 2019-08-15 DIAGNOSIS — R269 Unspecified abnormalities of gait and mobility: Secondary | ICD-10-CM | POA: Diagnosis not present

## 2019-08-15 DIAGNOSIS — R278 Other lack of coordination: Secondary | ICD-10-CM | POA: Diagnosis not present

## 2019-08-15 DIAGNOSIS — R262 Difficulty in walking, not elsewhere classified: Secondary | ICD-10-CM | POA: Diagnosis not present

## 2019-08-16 DIAGNOSIS — I10 Essential (primary) hypertension: Secondary | ICD-10-CM | POA: Diagnosis not present

## 2019-08-17 DIAGNOSIS — I639 Cerebral infarction, unspecified: Secondary | ICD-10-CM | POA: Diagnosis not present

## 2019-08-18 DIAGNOSIS — R471 Dysarthria and anarthria: Secondary | ICD-10-CM | POA: Diagnosis not present

## 2019-08-18 DIAGNOSIS — M6281 Muscle weakness (generalized): Secondary | ICD-10-CM | POA: Diagnosis not present

## 2019-08-18 DIAGNOSIS — I10 Essential (primary) hypertension: Secondary | ICD-10-CM | POA: Diagnosis not present

## 2019-08-18 DIAGNOSIS — R69 Illness, unspecified: Secondary | ICD-10-CM | POA: Diagnosis not present

## 2019-08-18 DIAGNOSIS — R262 Difficulty in walking, not elsewhere classified: Secondary | ICD-10-CM | POA: Diagnosis not present

## 2019-08-18 DIAGNOSIS — I69951 Hemiplegia and hemiparesis following unspecified cerebrovascular disease affecting right dominant side: Secondary | ICD-10-CM | POA: Diagnosis not present

## 2019-08-18 DIAGNOSIS — E119 Type 2 diabetes mellitus without complications: Secondary | ICD-10-CM | POA: Diagnosis not present

## 2019-08-23 DIAGNOSIS — R69 Illness, unspecified: Secondary | ICD-10-CM | POA: Diagnosis not present

## 2019-08-23 DIAGNOSIS — R471 Dysarthria and anarthria: Secondary | ICD-10-CM | POA: Diagnosis not present

## 2019-08-23 DIAGNOSIS — I69951 Hemiplegia and hemiparesis following unspecified cerebrovascular disease affecting right dominant side: Secondary | ICD-10-CM | POA: Diagnosis not present

## 2019-08-23 DIAGNOSIS — R262 Difficulty in walking, not elsewhere classified: Secondary | ICD-10-CM | POA: Diagnosis not present

## 2019-08-23 DIAGNOSIS — E119 Type 2 diabetes mellitus without complications: Secondary | ICD-10-CM | POA: Diagnosis not present

## 2019-08-23 DIAGNOSIS — M6281 Muscle weakness (generalized): Secondary | ICD-10-CM | POA: Diagnosis not present

## 2019-08-23 DIAGNOSIS — I10 Essential (primary) hypertension: Secondary | ICD-10-CM | POA: Diagnosis not present

## 2019-08-25 ENCOUNTER — Other Ambulatory Visit: Payer: Self-pay | Admitting: Cardiology

## 2019-08-25 DIAGNOSIS — R471 Dysarthria and anarthria: Secondary | ICD-10-CM | POA: Diagnosis not present

## 2019-08-25 DIAGNOSIS — I4891 Unspecified atrial fibrillation: Secondary | ICD-10-CM

## 2019-08-25 DIAGNOSIS — R69 Illness, unspecified: Secondary | ICD-10-CM | POA: Diagnosis not present

## 2019-08-25 DIAGNOSIS — R262 Difficulty in walking, not elsewhere classified: Secondary | ICD-10-CM | POA: Diagnosis not present

## 2019-08-25 DIAGNOSIS — I10 Essential (primary) hypertension: Secondary | ICD-10-CM | POA: Diagnosis not present

## 2019-08-25 DIAGNOSIS — E119 Type 2 diabetes mellitus without complications: Secondary | ICD-10-CM | POA: Diagnosis not present

## 2019-08-25 DIAGNOSIS — M6281 Muscle weakness (generalized): Secondary | ICD-10-CM | POA: Diagnosis not present

## 2019-08-25 DIAGNOSIS — I69951 Hemiplegia and hemiparesis following unspecified cerebrovascular disease affecting right dominant side: Secondary | ICD-10-CM | POA: Diagnosis not present

## 2019-08-25 DIAGNOSIS — I639 Cerebral infarction, unspecified: Secondary | ICD-10-CM

## 2019-09-01 ENCOUNTER — Telehealth: Payer: Self-pay | Admitting: *Deleted

## 2019-09-01 ENCOUNTER — Other Ambulatory Visit: Payer: Medicare HMO

## 2019-09-01 NOTE — Telephone Encounter (Addendum)
Spoke with pt daughter, aware of monitor results.  ----- Message from Lelon Perla, MD sent at 08/25/2019  6:22 PM EDT ----- Sinus with pacs and pvcs Kirk Ruths

## 2019-09-05 ENCOUNTER — Other Ambulatory Visit: Payer: Medicare HMO

## 2019-09-07 ENCOUNTER — Other Ambulatory Visit: Payer: Self-pay

## 2019-09-07 ENCOUNTER — Ambulatory Visit (INDEPENDENT_AMBULATORY_CARE_PROVIDER_SITE_OTHER): Payer: Medicare HMO

## 2019-09-07 DIAGNOSIS — I639 Cerebral infarction, unspecified: Secondary | ICD-10-CM

## 2019-09-07 DIAGNOSIS — I080 Rheumatic disorders of both mitral and aortic valves: Secondary | ICD-10-CM

## 2019-09-09 NOTE — Progress Notes (Signed)
HPI: FU CAD. Cardiac catheterization September 2016 showed 90% ostial LAD and 35% circumflex. She subsequently had coronary artery bypass graft with a LIMA to the LAD. Patient previously hospitalized at [facility] with CVA.  No records available.  Carotid Dopplers August 2020 showed no significant stenosis bilaterally. Monitor October 2020 showed sinus with PACs and PVCs. Echocardiogram October 2020 showed normal LV function, grade 1 diastolic dysfunction, mild mitral and tricuspid regurgitation as well as aortic insufficiency. Since last seenthere is no dyspnea, chest pain, palpitations or syncope.  Current Outpatient Medications  Medication Sig Dispense Refill  . aspirin 81 MG tablet Take 1 tablet (81 mg total) by mouth daily.    Marland Kitchen atorvastatin (LIPITOR) 80 MG tablet Take 1 tablet (80 mg total) by mouth daily. 90 tablet 3  . clopidogrel (PLAVIX) 75 MG tablet Take 75 mg by mouth daily.    Marland Kitchen glimepiride (AMARYL) 4 MG tablet Take 4 mg by mouth daily with breakfast.     . lisinopril (PRINIVIL,ZESTRIL) 10 MG tablet TAKE 1 TABLET DAILY 90 tablet 0  . metFORMIN (GLUCOPHAGE) 500 MG tablet Take 500 mg by mouth daily.    . metoprolol tartrate (LOPRESSOR) 25 MG tablet Take 1 tablet (25 mg total) by mouth 2 (two) times daily. Call office to make appointment for further refills. 30 tablet 0  . naproxen sodium (ALEVE) 220 MG tablet Take 220 mg by mouth as needed.    Marland Kitchen NITROSTAT 0.4 MG SL tablet Take 0.4 mg by mouth every 5 (five) minutes as needed for chest pain.      No current facility-administered medications for this visit.      Past Medical History:  Diagnosis Date  . Anemia   . GERD (gastroesophageal reflux disease)   . HTN (hypertension)   . Hypercholesterolemia   . LBBB (left bundle branch block)   . Lumbar disc disease   . Non-ischemic cardiomyopathy ([facility])   . Poor short term memory   . Type II diabetes mellitus ([place])     Past Surgical History:  Procedure Laterality Date   . APPENDECTOMY  1970's   [place]  . CARDIAC CATHETERIZATION N/A 08/08/2015   Procedure: Left Heart Cath and Coronary Angiography;  Surgeon: Belva Crome, MD;  Location: [place] CV LAB;  Service: Cardiovascular;  Laterality: N/A;  . CARDIAC CATHETERIZATION  01/31/2014  . CATARACT EXTRACTION W/ INTRAOCULAR LENS IMPLANT Right   . CATARACT EXTRACTION W/PHACO  07/24/2011   Procedure: CATARACT EXTRACTION PHACO AND INTRAOCULAR LENS PLACEMENT (IOC);  Surgeon: Tonny Branch;  Location: [facility] ORS;  Service: Ophthalmology;  Laterality: Left;  CDE: 14.73  . CORONARY ARTERY BYPASS GRAFT N/A 08/10/2015   Procedure: OFF PUMP CORONARY ARTERY BYPASS GRAFTING (CABG) UTILIZING THE LEFT INTERNAL MAMMARY ARTERY;  Surgeon: Melrose Nakayama, MD;  Location: [place];  Service: Open Heart Surgery;  Laterality: N/A;  . DILATION AND CURETTAGE OF UTERUS    . EYE SURGERY Bilateral    "laser; related to diabetes"  . FRACTURE SURGERY    . LEFT HEART CATHETERIZATION WITH CORONARY ANGIOGRAM N/A 01/31/2014   Procedure: LEFT HEART CATHETERIZATION WITH CORONARY ANGIOGRAM;  Surgeon: Peter M Martinique, MD;  Location: [facility] CATH LAB;  Service: Cardiovascular;  Laterality: N/A;  . ORIF ANKLE FRACTURE Right 1980's  . TEE WITHOUT CARDIOVERSION N/A 08/10/2015   Procedure: TRANSESOPHAGEAL ECHOCARDIOGRAM (TEE);  Surgeon: Melrose Nakayama, MD;  Location: [place];  Service: Open Heart Surgery;  Laterality: N/A;  . TUBAL LIGATION    .  VAGINAL HYSTERECTOMY  1980's    Social History   Socioeconomic History  . Marital status: Widowed    Spouse name: Not on file  . Number of children: 10  . Years of education: Not on file  . Highest education level: Not on file  Occupational History  . Not on file  Social Needs  . Financial resource strain: Not on file  . Food insecurity    Worry: Not on file    Inability: Not on file  . Transportation needs    Medical: Not on file    Non-medical: Not on file  Tobacco Use  . Smoking status: Never  Smoker  . Smokeless tobacco: Never Used  Substance and Sexual Activity  . Alcohol use: No    Alcohol/week: 0.0 standard drinks  . Drug use: No  . Sexual activity: Yes    Birth control/protection: Surgical  Lifestyle  . Physical activity    Days per week: Not on file    Minutes per session: Not on file  . Stress: Not on file  Relationships  . Social Musician on phone: Not on file    Gets together: Not on file    Attends religious service: Not on file    Active member of club or organization: Not on file    Attends meetings of clubs or organizations: Not on file    Relationship status: Not on file  . Intimate partner violence    Fear of current or ex partner: Not on file    Emotionally abused: Not on file    Physically abused: Not on file    Forced sexual activity: Not on file  Other Topics Concern  . Not on file  Social History Narrative  . Not on file    Family History  Problem Relation Age of Onset  . Anesthesia problems Neg Hx   . Hypotension Neg Hx   . Malignant hyperthermia Neg Hx   . Pseudochol deficiency Neg Hx     ROS: no fevers or chills, productive cough, hemoptysis, dysphasia, odynophagia, melena, hematochezia, dysuria, hematuria, rash, seizure activity, orthopnea, PND, pedal edema, claudication. Remaining systems are negative.  Physical Exam: Well-developed well-nourished in no acute distress.  Skin is warm and dry.  HEENT is normal.  Neck is supple.  Chest is clear to auscultation with normal expansion.  Cardiovascular exam is regular rate and rhythm.  Abdominal exam nontender or distended. No masses palpated. Extremities show no edema. neuro grossly intact  A/P  1 coronary artery disease-no chest pain.  Plan to continue medical therapy with aspirin and statin.  2 hypertension-blood pressure controlled.  Continue present medications and follow.  3 hyperlipidemia-continue statin.  4 prior CVA-echocardiogram showed preserved LV  function.  Monitor was negative.  I discussed implantable loop monitor today.  I explained the reason for undetected atrial fibrillation.  She is hesitant.  They will discuss with her other children and contact us if they wish to pursue.  5 history of moderate mitral regurgitation-mild on most recent echocardiogram.  Olga Millers, MD

## 2019-09-13 ENCOUNTER — Encounter: Payer: Self-pay | Admitting: Cardiology

## 2019-09-13 ENCOUNTER — Ambulatory Visit: Payer: Medicare HMO | Admitting: Cardiology

## 2019-09-13 ENCOUNTER — Other Ambulatory Visit: Payer: Self-pay

## 2019-09-13 VITALS — BP 136/60 | HR 65 | Temp 96.7°F | Ht 64.0 in | Wt 93.0 lb

## 2019-09-13 DIAGNOSIS — I2510 Atherosclerotic heart disease of native coronary artery without angina pectoris: Secondary | ICD-10-CM

## 2019-09-13 DIAGNOSIS — I10 Essential (primary) hypertension: Secondary | ICD-10-CM

## 2019-09-13 DIAGNOSIS — E7800 Pure hypercholesterolemia, unspecified: Secondary | ICD-10-CM

## 2019-09-13 NOTE — Patient Instructions (Signed)
Medication Instructions:  NO CHANGE *If you need a refill on your cardiac medications before your next appointment, please call your pharmacy*  Lab Work: If you have labs (blood work) drawn today and your tests are completely normal, you will receive your results only by: Marland Kitchen MyChart Message (if you have MyChart) OR . A paper copy in the mail If you have any lab test that is abnormal or we need to change your treatment, we will call you to review the results.  Follow-Up: At [facility], you and your health needs are our priority.  As part of our continuing mission to provide you with exceptional heart care, we have created designated Provider Care Teams.  These Care Teams include your primary Cardiologist (physician) and Advanced Practice Providers (APPs -  Physician Assistants and Nurse Practitioners) who all work together to provide you with the care you need, when you need it.  Your next appointment:   6 months  The format for your next appointment:   Virtual Visit   Provider:   Kirk Ruths, MD

## 2019-09-16 DIAGNOSIS — I10 Essential (primary) hypertension: Secondary | ICD-10-CM | POA: Diagnosis not present

## 2019-09-16 DIAGNOSIS — I639 Cerebral infarction, unspecified: Secondary | ICD-10-CM | POA: Diagnosis not present

## 2019-09-23 DIAGNOSIS — E119 Type 2 diabetes mellitus without complications: Secondary | ICD-10-CM | POA: Diagnosis not present

## 2019-09-23 DIAGNOSIS — I10 Essential (primary) hypertension: Secondary | ICD-10-CM | POA: Diagnosis not present

## 2019-09-30 DIAGNOSIS — E1122 Type 2 diabetes mellitus with diabetic chronic kidney disease: Secondary | ICD-10-CM | POA: Diagnosis not present

## 2019-09-30 DIAGNOSIS — Z2821 Immunization not carried out because of patient refusal: Secondary | ICD-10-CM | POA: Diagnosis not present

## 2019-09-30 DIAGNOSIS — Z299 Encounter for prophylactic measures, unspecified: Secondary | ICD-10-CM | POA: Diagnosis not present

## 2019-09-30 DIAGNOSIS — N183 Chronic kidney disease, stage 3 unspecified: Secondary | ICD-10-CM | POA: Diagnosis not present

## 2019-09-30 DIAGNOSIS — I10 Essential (primary) hypertension: Secondary | ICD-10-CM | POA: Diagnosis not present

## 2019-09-30 DIAGNOSIS — E1165 Type 2 diabetes mellitus with hyperglycemia: Secondary | ICD-10-CM | POA: Diagnosis not present

## 2019-09-30 DIAGNOSIS — Z681 Body mass index (BMI) 19 or less, adult: Secondary | ICD-10-CM | POA: Diagnosis not present

## 2019-09-30 DIAGNOSIS — I739 Peripheral vascular disease, unspecified: Secondary | ICD-10-CM | POA: Diagnosis not present

## 2019-10-17 DIAGNOSIS — I639 Cerebral infarction, unspecified: Secondary | ICD-10-CM | POA: Diagnosis not present

## 2019-10-17 DIAGNOSIS — I10 Essential (primary) hypertension: Secondary | ICD-10-CM | POA: Diagnosis not present

## 2019-11-16 DIAGNOSIS — I639 Cerebral infarction, unspecified: Secondary | ICD-10-CM | POA: Diagnosis not present

## 2019-11-21 DIAGNOSIS — R69 Illness, unspecified: Secondary | ICD-10-CM | POA: Diagnosis not present

## 2019-12-15 DIAGNOSIS — I10 Essential (primary) hypertension: Secondary | ICD-10-CM | POA: Diagnosis not present

## 2019-12-15 DIAGNOSIS — E119 Type 2 diabetes mellitus without complications: Secondary | ICD-10-CM | POA: Diagnosis not present

## 2019-12-17 DIAGNOSIS — I639 Cerebral infarction, unspecified: Secondary | ICD-10-CM | POA: Diagnosis not present

## 2020-01-15 DIAGNOSIS — I639 Cerebral infarction, unspecified: Secondary | ICD-10-CM | POA: Diagnosis not present

## 2020-01-25 DIAGNOSIS — I10 Essential (primary) hypertension: Secondary | ICD-10-CM | POA: Diagnosis not present

## 2020-01-25 DIAGNOSIS — E119 Type 2 diabetes mellitus without complications: Secondary | ICD-10-CM | POA: Diagnosis not present

## 2020-02-14 DIAGNOSIS — I639 Cerebral infarction, unspecified: Secondary | ICD-10-CM | POA: Diagnosis not present

## 2020-03-02 DIAGNOSIS — R69 Illness, unspecified: Secondary | ICD-10-CM | POA: Diagnosis not present

## 2020-03-06 DIAGNOSIS — I10 Essential (primary) hypertension: Secondary | ICD-10-CM | POA: Diagnosis not present

## 2020-03-06 DIAGNOSIS — E119 Type 2 diabetes mellitus without complications: Secondary | ICD-10-CM | POA: Diagnosis not present

## 2020-03-16 DIAGNOSIS — I639 Cerebral infarction, unspecified: Secondary | ICD-10-CM | POA: Diagnosis not present

## 2020-03-17 NOTE — Progress Notes (Signed)
Cardiology Office Note:    Date:  03/20/2020   ID:  Kelly Castillo, DOB 1934-07-07, MRN [id number]  PCP:  Kelly Peri, MD  Cardiologist:  Kelly Millers, MD  Electrophysiologist:  None   Referring MD: Kelly Peri, MD   Chief Complaint: follow-up of CAD and non-ischemic cardiomyopathy  History of Present Illness:    Kelly Castillo is a 86 y.o. female with a history of CAD s/p CABG with LIMA to LAD in 07/2015, non-ischemic cardiomyopathy with EF of 55-60% on Echo in 08/2019, CVA in 05/2019, mitral regurgitation, hypertension, hyperlipidemia, type 2 diabetes mellitus, GERD, and anemia who is followed by Dr. Jens Castillo and presents today for follow-up.   Patient initially seen by Dr. Jens Castillo for non-ischemic cardiomyopathy. Cardiac catheterization in 2004 showed non-obstructive CAD and significant mitral regurgitation graded 3+ or more. LVEF at that time was 41%. TEE at that time showed 2-3+ mitral regurgitation. She was admitted in 01/2014 with chest pain. She underwent cardiac catheterization which showed non-obstructive CAD and normal LV function. She continued to have exertional chest pain relieved with rest. Myoview was ordered in 07/2015 and showed a medium defect in the apical anterior, apical septal, and apex consistent with ischemia. Considered an intermediate risk study. Repeat left heart catheterization was scheduled and showed 90% stenosis of ostial LAD. Patient subsequently underwent CABG x1 with LIMA to LAD on 08/10/2015. Since then she has done well from a cardiac standpoint. She was hospitalized at [facility] in 05/2019 for CVA. Carotid dopplers in 06/2019 showed no significant stenosis bilaterally. Monitor in 102020 showed sinus rhythm with PACs/PVCs. Most recent Echo in 08/2019 showed LVEF of 55-60% with grade 1 diastolic dysfunction, mild MR, mild TR, and mild AI. Patient was last seen by Dr. Jens Castillo in 08/2019 at which time she was doing well from a cardiac standpoint. At that  visit, implantable loop recorder was recommended for further evaluation of atrial fibrillation given recent CVA. However, patient was hesitant and wanted to discuss this with family.   Patient presents today for follow-up. Here with son and daughter. Patient doing well from a cardiac standpoint. No chest pain, shortness of breath, orthopnea, PND, or palpitations. She notes occasional lightheadedness/dizziness if she stands up too quickly but no falls or syncope. She also notes some mild right foot/ankle edema since her stroke but states this is stable. Family notes her right sided is still a little weak from her stroke. Patient denies any abnormal bleeding in urine or stools. However, family notes one episode when they noticed patient's stools were very dark and a little "sticky." Patient was taking iron supplement.   Past Medical History:  Diagnosis Date  . Anemia   . GERD (gastroesophageal reflux disease)   . HTN (hypertension)   . Hypercholesterolemia   . LBBB (left bundle branch block)   . Lumbar disc disease   . Non-ischemic cardiomyopathy (HCC)   . Poor short term memory   . Type II diabetes mellitus (HCC)     Past Surgical History:  Procedure Laterality Date  . APPENDECTOMY  1970's   [place]  . CARDIAC CATHETERIZATION N/A 08/08/2015   Procedure: Left Heart Cath and Coronary Angiography;  Surgeon: Lyn Records, MD;  Location: [facility] INVASIVE CV LAB;  Service: Cardiovascular;  Laterality: N/A;  . CARDIAC CATHETERIZATION  01/31/2014  . CATARACT EXTRACTION W/ INTRAOCULAR LENS IMPLANT Right   . CATARACT EXTRACTION W/PHACO  07/24/2011   Procedure: CATARACT EXTRACTION PHACO AND INTRAOCULAR LENS PLACEMENT (IOC);  Surgeon: Gemma Payor;  Location: [facility];  Service: Ophthalmology;  Laterality: Left;  CDE: 14.73  . CORONARY ARTERY BYPASS GRAFT N/A 08/10/2015   Procedure: OFF PUMP CORONARY ARTERY BYPASS GRAFTING (CABG) UTILIZING THE LEFT INTERNAL MAMMARY ARTERY;  Surgeon: Melrose Nakayama, MD;   Location: [place];  Service: Open Heart Surgery;  Laterality: N/A;  . DILATION AND CURETTAGE OF UTERUS    . EYE SURGERY Bilateral    "laser; related to diabetes"  . FRACTURE SURGERY    . LEFT HEART CATHETERIZATION WITH CORONARY ANGIOGRAM N/A 01/31/2014   Procedure: LEFT HEART CATHETERIZATION WITH CORONARY ANGIOGRAM;  Surgeon: Peter M Martinique, MD;  Location: [facility] CATH LAB;  Service: Cardiovascular;  Laterality: N/A;  . ORIF ANKLE FRACTURE Right 1980's  . TEE WITHOUT CARDIOVERSION N/A 08/10/2015   Procedure: TRANSESOPHAGEAL ECHOCARDIOGRAM (TEE);  Surgeon: Melrose Nakayama, MD;  Location: [place];  Service: Open Heart Surgery;  Laterality: N/A;  . TUBAL LIGATION    . VAGINAL HYSTERECTOMY  1980's    Current Medications: Current Meds  Medication Sig  . acetaminophen (TYLENOL) 325 MG tablet Take 650 mg by mouth as needed.  Marland Kitchen aspirin 81 MG tablet Take 1 tablet (81 mg total) by mouth daily.  Marland Kitchen atorvastatin (LIPITOR) 80 MG tablet Take 1 tablet (80 mg total) by mouth daily.  . clopidogrel (PLAVIX) 75 MG tablet Take 75 mg by mouth daily.  Marland Kitchen glimepiride (AMARYL) 4 MG tablet Take 4 mg by mouth daily with breakfast.   . lisinopril (PRINIVIL,ZESTRIL) 10 MG tablet TAKE 1 TABLET DAILY  . metFORMIN (GLUCOPHAGE) 500 MG tablet Take 500 mg by mouth daily.  . metoprolol tartrate (LOPRESSOR) 25 MG tablet Take 1 tablet (25 mg total) by mouth 2 (two) times daily. Call office to make appointment for further refills.  Marland Kitchen NITROSTAT 0.4 MG SL tablet Take 0.4 mg by mouth every 5 (five) minutes as needed for chest pain.   . [DISCONTINUED] metoprolol tartrate (LOPRESSOR) 25 MG tablet Take 1 tablet (25 mg total) by mouth 2 (two) times daily. Call office to make appointment for further refills.     Allergies:   Patient has no known allergies.   Social History   Socioeconomic History  . Marital status: Widowed    Spouse name: Not on file  . Number of children: 10  . Years of education: Not on file  . Highest education  level: Not on file  Occupational History  . Not on file  Tobacco Use  . Smoking status: Never Smoker  . Smokeless tobacco: Never Used  Substance and Sexual Activity  . Alcohol use: No    Alcohol/week: 0.0 standard drinks  . Drug use: No  . Sexual activity: Yes    Birth control/protection: Surgical  Other Topics Concern  . Not on file  Social History Narrative  . Not on file   Social Determinants of Health   Financial Resource Strain:   . Difficulty of Paying Living Expenses:   Food Insecurity:   . Worried About Charity fundraiser in the Last Year:   . Arboriculturist in the Last Year:   Transportation Needs:   . Film/video editor (Medical):   Marland Kitchen Lack of Transportation (Non-Medical):   Physical Activity:   . Days of Exercise per Week:   . Minutes of Exercise per Session:   Stress:   . Feeling of Stress :   Social Connections:   . Frequency of Communication with Friends and Family:   . Frequency of Social  Gatherings with Friends and Family:   . Attends Religious Services:   . Active Member of Clubs or Organizations:   . Attends Banker Meetings:   Marland Kitchen Marital Status:      Family History: The patient's family history is negative for Anesthesia problems, Hypotension, Malignant hyperthermia, and Pseudochol deficiency.  ROS:   Please see the history of present illness.    All other systems reviewed and are negative.  EKGs/Labs/Other Studies Reviewed:    The following studies were reviewed today:  Left Heart Catheterization 08/08/2015: 1. Ost LAD lesion, 90% stenosed. 2. Prox Cx to Mid Cx lesion, 35% stenosed.    Severe ostial LAD in a 86 year old diabetic.  Otherwise widely patent coronaries with less than 50% mid circumflex obstruction.  Overall normal left ventricular function.   Recommendations:  Treatment options include all pump LIMA to LAD versus LAD ostial stent. Given her diabetic status and otherwise relatively good health, LIMA  should be considered. We will be prepared to do ostial LAD stent tomorrow if she is deemed non-surgical. _______________  Carotid Doppler 07/04/2019: No significant stenosis bilaterally.  _______________  Event Monitor 07/20/2019 to 08/18/2019: Sinus with PACs and PVCs. _______________  Echocardiogram 09/07/2019: Impressions: 1. Left ventricular ejection fraction, by visual estimation, is 55 to  60%. The left ventricle has normal function. There is borderline left  ventricular hypertrophy.  2. Left ventricular diastolic Doppler parameters are consistent with  impaired relaxation pattern of LV diastolic filling.  3. Global right ventricle has normal systolic function.The right  ventricular size is mildly enlarged. No increase in right ventricular wall  thickness.  4. Left atrial size was upper normal.  5. Right atrial size was normal.  6. Mild aortic valve annular calcification.  7. The mitral valve is mildly thickened. Mild mitral valve regurgitation.  8. The tricuspid valve is grossly normal. Tricuspid valve regurgitation  is mild.  9. The aortic valve is tricuspid Aortic valve regurgitation is mild by  color flow Doppler.  10. The pulmonic valve was grossly normal. Pulmonic valve regurgitation is  trivial by color flow Doppler.  11. Normal pulmonary artery systolic pressure.  12. The tricuspid regurgitant velocity is 2.63 m/s, and with an assumed  right atrial pressure of 3 mmHg, the estimated right ventricular systolic  pressure is normal at 30.7 mmHg.  13. The inferior vena cava is normal in size with greater than 50%  respiratory variability, suggesting right atrial pressure of 3 mmHg.  14. Hypermobile interatrial septum without obvious shunt.   EKG:  EKG ordered today. EKG personally reviewed and demonstrates norma sinus rhythm with sinus arrhythmia. Rate 81 bpm. Known LBBB.  Recent Labs: No results found for requested labs within last 8760 hours.  Recent Lipid  Panel    Component Value Date/Time   CHOL 150 08/05/2016 1057   TRIG 73 08/05/2016 1057   HDL 64 08/05/2016 1057   CHOLHDL 2.3 08/05/2016 1057   VLDL 15 08/05/2016 1057   LDLCALC 71 08/05/2016 1057    Physical Exam:    Vital Signs: BP 140/64   Pulse 81   Ht 5' (1.524 m)   Wt 92 lb 9.6 oz (42 kg)   BMI 18.08 kg/m     Wt Readings from Last 3 Encounters:  03/20/20 92 lb 9.6 oz (42 kg)  09/13/19 93 lb (42.2 kg)  06/20/19 104 lb (47.2 kg)     General: 86 y.o. female in no acute distress. HEENT: Normocephalic and atraumatic. Sclera clear.  Neck:  Supple. No carotid bruits. No JVD. Heart: RRR. Distinct S1 and S2. No murmurs, gallops, or rubs. Radial pulses 2+ and equal bilaterally. Lungs: No increased work of breathing. Clear to ausculation bilaterally. No wheezes, rhonchi, or rales.  Abdomen: Soft, non-distended, and non-tender to palpation.  Extremities: Minimal right ankle edema.  Skin: Warm and dry. Neuro: Alert and oriented x3. No focal deficits. Psych: Normal affect. Responds appropriately.  Assessment:    1. Coronary artery disease involving native coronary artery of native heart without angina pectoris   2. Non-ischemic cardiomyopathy (HCC)   3. Essential hypertension   4. Hyperlipidemia, unspecified hyperlipidemia type   5. Type 2 diabetes mellitus with complication, without long-term current use of insulin (HCC)   6. Cerebrovascular accident (CVA), unspecified mechanism (HCC)   7. Therapeutic drug monitoring     Plan:    CAD - History of CABG with LIMA to LAD in 07/2015. - Stable. No angina.  - Continue aspirin, beta-blocker, and high-intensity statin. Also on Plavix due to CVA. - Will check CBC when patient comes in for fasting labs to make sure hemoglobin is stable on dual antiplatelet therapy.   Non-Ischemic Cardiomyopathy - LVEF of 55-60% on Echo in 08/2019.  - Stable. Euvolemic on exam.  - No need for diuretics at this time.  - Continue ACEi and  beta-blocker.   Mitral Regurgitation - History of moderate mitral regurgitation. However, considered mild on most recent Echo.  Hypertension - BP mildly elevated at 140/64. However, patient has been out of home Lopressor for a couple of days.  - Continue Lisinopril 10mg  daily and Lopressor 25mg  twice daily.  - Will recheck renal function and potassium when patient comes in for fasting labs in the next 1-2 weeks. - Asked patient/family to keep BP and heart rate log for the next 2-3 weeks and then notify us of these numbers. Don't think I will be overly aggressive in treating BP given patient's age but there may be some room for improvement.  Hyperlipidemia - Continue Liptior 10mg  daily.  - Patient is not fasting today so will have her come back for fasting lipid panel and CMET within the next 1-2 weeks. LDL goal <70 given CAD and CVA.  Type 2 Diabetes Mellitus - On Metformin and Glimepiride. - Followed by PCP.   CVA - History of CVA in 04/2019.  - At last visit, Dr. Jens Castillo recommended implantable loop recorder but patient was hesitant. Discussed again with patient/family today and they do not want to pursue this at this time. - On dual antiplatelet therapy with Aspirin and Plavix. Will defer duration of this to PCP. - Continue statin.   Disposition: Follow up in 6 months with    Medication Adjustments/Labs and Tests Ordered: Current medicines are reviewed at length with the patient today.  Concerns regarding medicines are outlined above.  Orders Placed This Encounter  Procedures  . Comprehensive metabolic panel  . Lipid panel  . CBC with Differential/Platelet  . EKG 12-Lead   Meds ordered this encounter  Medications  . metoprolol tartrate (LOPRESSOR) 25 MG tablet    Sig: Take 1 tablet (25 mg total) by mouth 2 (two) times daily. Call office to make appointment for further refills.    Dispense:  180 tablet    Refill:  3    Patient Instructions  Medication Instructions:    Your physician recommends that you continue on your current medications as directed. Please refer to the Current Medication list given to you today.  *  If you need a refill on your cardiac medications before your next appointment, please call your pharmacy*  Lab Work: FASTING LP/CMET/CBC SOON AT [facility]   If you have labs (blood work) drawn today and your tests are completely normal, you will receive your results only by: Marland Kitchen MyChart Message (if you have MyChart) OR . A paper copy in the mail If you have any lab test that is abnormal or we need to change your treatment, we will call you to review the results.  Testing/Procedures: NONE  Follow-Up: At [facility], you and your health needs are our priority.  As part of our continuing mission to provide you with exceptional heart care, we have created designated Provider Care Teams.  These Care Teams include your primary Cardiologist (physician) and Advanced Practice Providers (APPs -  Physician Assistants and Nurse Practitioners) who all work together to provide you with the care you need, when you need it.  We recommend signing up for the patient portal called "MyChart".  Sign up information is provided on this After Visit Summary.  MyChart is used to connect with patients for Virtual Visits (Telemedicine).  Patients are able to view lab/test results, encounter notes, upcoming appointments, etc.  Non-urgent messages can be sent to your provider as well.   To learn more about what you can do with MyChart, go to [email or web address].    Your next appointment:   6 month(s) You will receive a reminder letter in the mail two months in advance. If you don't receive a letter, please call our office to schedule the follow-up appointment.  The format for your next appointment:   In Person  Provider:   DR Kelly Castillo   Other Instructions  MONITOR YOUR HEART RATE BLOOD PRESSURE, LOG THEM FOR 2 WEEKS CALL THE OFFICE [phone] WITH READINGS    DRINK MORE WATER     Signed, Smitty Knudsen  03/20/2020 3:18 PM    [facility]

## 2020-03-20 ENCOUNTER — Other Ambulatory Visit: Payer: Self-pay

## 2020-03-20 ENCOUNTER — Ambulatory Visit (INDEPENDENT_AMBULATORY_CARE_PROVIDER_SITE_OTHER): Payer: Medicare HMO | Admitting: Student

## 2020-03-20 ENCOUNTER — Encounter: Payer: Self-pay | Admitting: Student

## 2020-03-20 VITALS — BP 140/64 | HR 81 | Ht 60.0 in | Wt 92.6 lb

## 2020-03-20 DIAGNOSIS — E785 Hyperlipidemia, unspecified: Secondary | ICD-10-CM

## 2020-03-20 DIAGNOSIS — I639 Cerebral infarction, unspecified: Secondary | ICD-10-CM

## 2020-03-20 DIAGNOSIS — I428 Other cardiomyopathies: Secondary | ICD-10-CM | POA: Diagnosis not present

## 2020-03-20 DIAGNOSIS — I10 Essential (primary) hypertension: Secondary | ICD-10-CM

## 2020-03-20 DIAGNOSIS — E118 Type 2 diabetes mellitus with unspecified complications: Secondary | ICD-10-CM | POA: Diagnosis not present

## 2020-03-20 DIAGNOSIS — Z5181 Encounter for therapeutic drug level monitoring: Secondary | ICD-10-CM | POA: Diagnosis not present

## 2020-03-20 DIAGNOSIS — I340 Nonrheumatic mitral (valve) insufficiency: Secondary | ICD-10-CM

## 2020-03-20 DIAGNOSIS — I2510 Atherosclerotic heart disease of native coronary artery without angina pectoris: Secondary | ICD-10-CM | POA: Diagnosis not present

## 2020-03-20 MED ORDER — METOPROLOL TARTRATE 25 MG PO TABS: 25.0000 mg | ORAL_TABLET | Freq: Two times a day (BID) | ORAL | 3 refills | Status: DC

## 2020-03-20 NOTE — Patient Instructions (Addendum)
Medication Instructions:  Your physician recommends that you continue on your current medications as directed. Please refer to the Current Medication list given to you today.  *If you need a refill on your cardiac medications before your next appointment, please call your pharmacy*  Lab Work: FASTING LP/CMET/CBC SOON AT [facility]   If you have labs (blood work) drawn today and your tests are completely normal, you will receive your results only by: Marland Kitchen MyChart Message (if you have MyChart) OR . A paper copy in the mail If you have any lab test that is abnormal or we need to change your treatment, we will call you to review the results.  Testing/Procedures: NONE  Follow-Up: At [facility], you and your health needs are our priority.  As part of our continuing mission to provide you with exceptional heart care, we have created designated Provider Care Teams.  These Care Teams include your primary Cardiologist (physician) and Advanced Practice Providers (APPs -  Physician Assistants and Nurse Practitioners) who all work together to provide you with the care you need, when you need it.  We recommend signing up for the patient portal called "MyChart".  Sign up information is provided on this After Visit Summary.  MyChart is used to connect with patients for Virtual Visits (Telemedicine).  Patients are able to view lab/test results, encounter notes, upcoming appointments, etc.  Non-urgent messages can be sent to your provider as well.   To learn more about what you can do with MyChart, go to [email or web address].    Your next appointment:   6 month(s) You will receive a reminder letter in the mail two months in advance. If you don't receive a letter, please call our office to schedule the follow-up appointment.  The format for your next appointment:   In Person  Provider:   DR Jens Som   Other Instructions  MONITOR YOUR HEART RATE BLOOD PRESSURE, LOG THEM FOR 2 WEEKS CALL THE OFFICE  [phone] WITH READINGS   DRINK MORE WATER

## 2020-04-10 DIAGNOSIS — B351 Tinea unguium: Secondary | ICD-10-CM | POA: Diagnosis not present

## 2020-04-10 DIAGNOSIS — L84 Corns and callosities: Secondary | ICD-10-CM | POA: Diagnosis not present

## 2020-04-10 DIAGNOSIS — M79676 Pain in unspecified toe(s): Secondary | ICD-10-CM | POA: Diagnosis not present

## 2020-04-10 DIAGNOSIS — E1142 Type 2 diabetes mellitus with diabetic polyneuropathy: Secondary | ICD-10-CM | POA: Diagnosis not present

## 2020-04-12 DIAGNOSIS — E118 Type 2 diabetes mellitus with unspecified complications: Secondary | ICD-10-CM | POA: Diagnosis not present

## 2020-04-12 DIAGNOSIS — E785 Hyperlipidemia, unspecified: Secondary | ICD-10-CM | POA: Diagnosis not present

## 2020-04-12 DIAGNOSIS — I10 Essential (primary) hypertension: Secondary | ICD-10-CM | POA: Diagnosis not present

## 2020-04-12 DIAGNOSIS — Z5181 Encounter for therapeutic drug level monitoring: Secondary | ICD-10-CM | POA: Diagnosis not present

## 2020-04-13 ENCOUNTER — Other Ambulatory Visit: Payer: Self-pay

## 2020-04-13 DIAGNOSIS — I2510 Atherosclerotic heart disease of native coronary artery without angina pectoris: Secondary | ICD-10-CM

## 2020-04-13 DIAGNOSIS — I428 Other cardiomyopathies: Secondary | ICD-10-CM

## 2020-04-13 LAB — COMPREHENSIVE METABOLIC PANEL
ALT: 5 IU/L (ref 0–32)
ALT: 6 IU/L (ref 0–32)
AST: 13 IU/L (ref 0–40)
AST: 14 IU/L (ref 0–40)
Albumin/Globulin Ratio: 1.8 (ref 1.2–2.2)
Albumin/Globulin Ratio: 1.9 (ref 1.2–2.2)
Albumin: 4.5 g/dL (ref 3.6–4.6)
Albumin: 4.8 g/dL — ABNORMAL HIGH (ref 3.6–4.6)
Alkaline Phosphatase: 36 IU/L — ABNORMAL LOW (ref 48–121)
Alkaline Phosphatase: 37 IU/L — ABNORMAL LOW (ref 48–121)
BUN/Creatinine Ratio: 20 (ref 12–28)
BUN/Creatinine Ratio: 20 (ref 12–28)
BUN: 17 mg/dL (ref 8–27)
BUN: 18 mg/dL (ref 8–27)
Bilirubin Total: 0.4 mg/dL (ref 0.0–1.2)
Bilirubin Total: 0.5 mg/dL (ref 0.0–1.2)
CO2: 25 mmol/L (ref 20–29)
CO2: 26 mmol/L (ref 20–29)
Calcium: 10.3 mg/dL (ref 8.7–10.3)
Calcium: 10.4 mg/dL — ABNORMAL HIGH (ref 8.7–10.3)
Chloride: 101 mmol/L (ref 96–106)
Chloride: 102 mmol/L (ref 96–106)
Creatinine, Ser: 0.86 mg/dL (ref 0.57–1.00)
Creatinine, Ser: 0.9 mg/dL (ref 0.57–1.00)
GFR calc Af Amer: 68 mL/min/{1.73_m2} (ref >59)
GFR calc Af Amer: 72 mL/min/{1.73_m2} (ref >59)
GFR calc non Af Amer: 59 mL/min/{1.73_m2} — ABNORMAL LOW (ref >59)
GFR calc non Af Amer: 62 mL/min/{1.73_m2} (ref >59)
Globulin, Total: 2.5 g/dL (ref 1.5–4.5)
Globulin, Total: 2.5 g/dL (ref 1.5–4.5)
Glucose: 150 mg/dL — ABNORMAL HIGH (ref 65–99)
Glucose: 156 mg/dL — ABNORMAL HIGH (ref 65–99)
Potassium: 4.6 mmol/L (ref 3.5–5.2)
Potassium: 5 mmol/L (ref 3.5–5.2)
Sodium: 140 mmol/L (ref 134–144)
Sodium: 142 mmol/L (ref 134–144)
Total Protein: 7 g/dL (ref 6.0–8.5)
Total Protein: 7.3 g/dL (ref 6.0–8.5)

## 2020-04-13 LAB — LIPID PANEL
Chol/HDL Ratio: 3 ratio (ref 0.0–4.4)
Chol/HDL Ratio: 3.1 ratio (ref 0.0–4.4)
Cholesterol, Total: 210 mg/dL — ABNORMAL HIGH (ref 100–199)
Cholesterol, Total: 216 mg/dL — ABNORMAL HIGH (ref 100–199)
HDL: 70 mg/dL (ref >39)
HDL: 70 mg/dL (ref >39)
LDL Chol Calc (NIH): 125 mg/dL — ABNORMAL HIGH (ref 0–99)
LDL Chol Calc (NIH): 131 mg/dL — ABNORMAL HIGH (ref 0–99)
Triglycerides: 84 mg/dL (ref 0–149)
Triglycerides: 88 mg/dL (ref 0–149)
VLDL Cholesterol Cal: 15 mg/dL (ref 5–40)
VLDL Cholesterol Cal: 15 mg/dL (ref 5–40)

## 2020-04-13 LAB — CBC WITH DIFFERENTIAL/PLATELET
Basophils Absolute: 0 10*3/uL (ref 0.0–0.2)
Basos: 1 %
EOS (ABSOLUTE): 0.2 10*3/uL (ref 0.0–0.4)
Eos: 4 %
Hematocrit: 33.8 % — ABNORMAL LOW (ref 34.0–46.6)
Hemoglobin: 11.2 g/dL (ref 11.1–15.9)
Immature Grans (Abs): 0 10*3/uL (ref 0.0–0.1)
Immature Granulocytes: 0 %
Lymphocytes Absolute: 1.7 10*3/uL (ref 0.7–3.1)
Lymphs: 39 %
MCH: 28.1 pg (ref 26.6–33.0)
MCHC: 33.1 g/dL (ref 31.5–35.7)
MCV: 85 fL (ref 79–97)
Monocytes Absolute: 0.4 10*3/uL (ref 0.1–0.9)
Monocytes: 10 %
Neutrophils Absolute: 2 10*3/uL (ref 1.4–7.0)
Neutrophils: 46 %
Platelets: 335 10*3/uL (ref 150–450)
RBC: 3.99 x10E6/uL (ref 3.77–5.28)
RDW: 15.5 % — ABNORMAL HIGH (ref 11.7–15.4)
WBC: 4.4 10*3/uL (ref 3.4–10.8)

## 2020-04-13 MED ORDER — LISINOPRIL 20 MG PO TABS: 20.0000 mg | ORAL_TABLET | Freq: Every day | ORAL | 1 refills | Status: DC

## 2020-04-13 MED ORDER — ROSUVASTATIN CALCIUM 20 MG PO TABS
20.0000 mg | ORAL_TABLET | Freq: Every day | ORAL | 3 refills | Status: DC
Start: 2020-04-13 — End: 2020-10-09

## 2020-04-15 DIAGNOSIS — E119 Type 2 diabetes mellitus without complications: Secondary | ICD-10-CM | POA: Diagnosis not present

## 2020-04-15 DIAGNOSIS — I10 Essential (primary) hypertension: Secondary | ICD-10-CM | POA: Diagnosis not present

## 2020-04-19 DIAGNOSIS — Z299 Encounter for prophylactic measures, unspecified: Secondary | ICD-10-CM | POA: Diagnosis not present

## 2020-04-19 DIAGNOSIS — E1165 Type 2 diabetes mellitus with hyperglycemia: Secondary | ICD-10-CM | POA: Diagnosis not present

## 2020-04-19 DIAGNOSIS — Z681 Body mass index (BMI) 19 or less, adult: Secondary | ICD-10-CM | POA: Diagnosis not present

## 2020-04-19 DIAGNOSIS — G309 Alzheimer's disease, unspecified: Secondary | ICD-10-CM | POA: Diagnosis not present

## 2020-04-19 DIAGNOSIS — E1129 Type 2 diabetes mellitus with other diabetic kidney complication: Secondary | ICD-10-CM | POA: Diagnosis not present

## 2020-04-19 DIAGNOSIS — I10 Essential (primary) hypertension: Secondary | ICD-10-CM | POA: Diagnosis not present

## 2020-04-19 DIAGNOSIS — Z789 Other specified health status: Secondary | ICD-10-CM | POA: Diagnosis not present

## 2020-05-15 DIAGNOSIS — R69 Illness, unspecified: Secondary | ICD-10-CM | POA: Diagnosis not present

## 2020-05-16 DIAGNOSIS — E119 Type 2 diabetes mellitus without complications: Secondary | ICD-10-CM | POA: Diagnosis not present

## 2020-05-16 DIAGNOSIS — I10 Essential (primary) hypertension: Secondary | ICD-10-CM | POA: Diagnosis not present

## 2020-06-15 DIAGNOSIS — E119 Type 2 diabetes mellitus without complications: Secondary | ICD-10-CM | POA: Diagnosis not present

## 2020-06-15 DIAGNOSIS — I10 Essential (primary) hypertension: Secondary | ICD-10-CM | POA: Diagnosis not present

## 2020-07-17 DIAGNOSIS — L84 Corns and callosities: Secondary | ICD-10-CM | POA: Diagnosis not present

## 2020-07-17 DIAGNOSIS — E1142 Type 2 diabetes mellitus with diabetic polyneuropathy: Secondary | ICD-10-CM | POA: Diagnosis not present

## 2020-07-17 DIAGNOSIS — B351 Tinea unguium: Secondary | ICD-10-CM | POA: Diagnosis not present

## 2020-07-17 DIAGNOSIS — M79676 Pain in unspecified toe(s): Secondary | ICD-10-CM | POA: Diagnosis not present

## 2020-08-02 DIAGNOSIS — H35033 Hypertensive retinopathy, bilateral: Secondary | ICD-10-CM | POA: Diagnosis not present

## 2020-08-02 DIAGNOSIS — H52209 Unspecified astigmatism, unspecified eye: Secondary | ICD-10-CM | POA: Diagnosis not present

## 2020-08-02 DIAGNOSIS — H02831 Dermatochalasis of right upper eyelid: Secondary | ICD-10-CM | POA: Diagnosis not present

## 2020-08-02 DIAGNOSIS — E119 Type 2 diabetes mellitus without complications: Secondary | ICD-10-CM | POA: Diagnosis not present

## 2020-08-02 DIAGNOSIS — Z0100 Encounter for examination of eyes and vision without abnormal findings: Secondary | ICD-10-CM | POA: Diagnosis not present

## 2020-08-02 DIAGNOSIS — Z961 Presence of intraocular lens: Secondary | ICD-10-CM | POA: Diagnosis not present

## 2020-08-02 DIAGNOSIS — H02834 Dermatochalasis of left upper eyelid: Secondary | ICD-10-CM | POA: Diagnosis not present

## 2020-08-09 DIAGNOSIS — Z1339 Encounter for screening examination for other mental health and behavioral disorders: Secondary | ICD-10-CM | POA: Diagnosis not present

## 2020-08-09 DIAGNOSIS — E559 Vitamin D deficiency, unspecified: Secondary | ICD-10-CM | POA: Diagnosis not present

## 2020-08-09 DIAGNOSIS — Z1331 Encounter for screening for depression: Secondary | ICD-10-CM | POA: Diagnosis not present

## 2020-08-09 DIAGNOSIS — E7800 Pure hypercholesterolemia, unspecified: Secondary | ICD-10-CM | POA: Diagnosis not present

## 2020-08-09 DIAGNOSIS — Z79899 Other long term (current) drug therapy: Secondary | ICD-10-CM | POA: Diagnosis not present

## 2020-08-09 DIAGNOSIS — Z7189 Other specified counseling: Secondary | ICD-10-CM | POA: Diagnosis not present

## 2020-08-09 DIAGNOSIS — E1165 Type 2 diabetes mellitus with hyperglycemia: Secondary | ICD-10-CM | POA: Diagnosis not present

## 2020-08-09 DIAGNOSIS — R5383 Other fatigue: Secondary | ICD-10-CM | POA: Diagnosis not present

## 2020-08-09 DIAGNOSIS — Z299 Encounter for prophylactic measures, unspecified: Secondary | ICD-10-CM | POA: Diagnosis not present

## 2020-08-09 DIAGNOSIS — Z681 Body mass index (BMI) 19 or less, adult: Secondary | ICD-10-CM | POA: Diagnosis not present

## 2020-08-09 DIAGNOSIS — Z0000 Encounter for general adult medical examination without abnormal findings: Secondary | ICD-10-CM | POA: Diagnosis not present

## 2020-08-13 DIAGNOSIS — E113393 Type 2 diabetes mellitus with moderate nonproliferative diabetic retinopathy without macular edema, bilateral: Secondary | ICD-10-CM | POA: Diagnosis not present

## 2020-08-13 DIAGNOSIS — H35373 Puckering of macula, bilateral: Secondary | ICD-10-CM | POA: Diagnosis not present

## 2020-08-13 DIAGNOSIS — H43813 Vitreous degeneration, bilateral: Secondary | ICD-10-CM | POA: Diagnosis not present

## 2020-08-13 DIAGNOSIS — H35031 Hypertensive retinopathy, right eye: Secondary | ICD-10-CM | POA: Diagnosis not present

## 2020-08-16 DIAGNOSIS — I10 Essential (primary) hypertension: Secondary | ICD-10-CM | POA: Diagnosis not present

## 2020-08-16 DIAGNOSIS — E119 Type 2 diabetes mellitus without complications: Secondary | ICD-10-CM | POA: Diagnosis not present

## 2020-08-17 DIAGNOSIS — S233XXA Sprain of ligaments of thoracic spine, initial encounter: Secondary | ICD-10-CM | POA: Diagnosis not present

## 2020-08-17 DIAGNOSIS — M9903 Segmental and somatic dysfunction of lumbar region: Secondary | ICD-10-CM | POA: Diagnosis not present

## 2020-08-17 DIAGNOSIS — M47816 Spondylosis without myelopathy or radiculopathy, lumbar region: Secondary | ICD-10-CM | POA: Diagnosis not present

## 2020-08-17 DIAGNOSIS — M9902 Segmental and somatic dysfunction of thoracic region: Secondary | ICD-10-CM | POA: Diagnosis not present

## 2020-08-17 DIAGNOSIS — R2689 Other abnormalities of gait and mobility: Secondary | ICD-10-CM | POA: Diagnosis not present

## 2020-08-17 DIAGNOSIS — R42 Dizziness and giddiness: Secondary | ICD-10-CM | POA: Diagnosis not present

## 2020-08-17 DIAGNOSIS — M9901 Segmental and somatic dysfunction of cervical region: Secondary | ICD-10-CM | POA: Diagnosis not present

## 2020-08-20 DIAGNOSIS — R69 Illness, unspecified: Secondary | ICD-10-CM | POA: Diagnosis not present

## 2020-08-21 DIAGNOSIS — M9903 Segmental and somatic dysfunction of lumbar region: Secondary | ICD-10-CM | POA: Diagnosis not present

## 2020-08-21 DIAGNOSIS — M9902 Segmental and somatic dysfunction of thoracic region: Secondary | ICD-10-CM | POA: Diagnosis not present

## 2020-08-21 DIAGNOSIS — M9901 Segmental and somatic dysfunction of cervical region: Secondary | ICD-10-CM | POA: Diagnosis not present

## 2020-08-21 DIAGNOSIS — S233XXA Sprain of ligaments of thoracic spine, initial encounter: Secondary | ICD-10-CM | POA: Diagnosis not present

## 2020-08-21 DIAGNOSIS — R42 Dizziness and giddiness: Secondary | ICD-10-CM | POA: Diagnosis not present

## 2020-08-21 DIAGNOSIS — R2689 Other abnormalities of gait and mobility: Secondary | ICD-10-CM | POA: Diagnosis not present

## 2020-08-21 DIAGNOSIS — M47816 Spondylosis without myelopathy or radiculopathy, lumbar region: Secondary | ICD-10-CM | POA: Diagnosis not present

## 2020-08-23 DIAGNOSIS — S233XXA Sprain of ligaments of thoracic spine, initial encounter: Secondary | ICD-10-CM | POA: Diagnosis not present

## 2020-08-23 DIAGNOSIS — M9902 Segmental and somatic dysfunction of thoracic region: Secondary | ICD-10-CM | POA: Diagnosis not present

## 2020-08-23 DIAGNOSIS — R42 Dizziness and giddiness: Secondary | ICD-10-CM | POA: Diagnosis not present

## 2020-08-23 DIAGNOSIS — M9901 Segmental and somatic dysfunction of cervical region: Secondary | ICD-10-CM | POA: Diagnosis not present

## 2020-08-23 DIAGNOSIS — M47816 Spondylosis without myelopathy or radiculopathy, lumbar region: Secondary | ICD-10-CM | POA: Diagnosis not present

## 2020-08-23 DIAGNOSIS — R2689 Other abnormalities of gait and mobility: Secondary | ICD-10-CM | POA: Diagnosis not present

## 2020-08-23 DIAGNOSIS — M9903 Segmental and somatic dysfunction of lumbar region: Secondary | ICD-10-CM | POA: Diagnosis not present

## 2020-08-28 DIAGNOSIS — M9901 Segmental and somatic dysfunction of cervical region: Secondary | ICD-10-CM | POA: Diagnosis not present

## 2020-08-28 DIAGNOSIS — M47816 Spondylosis without myelopathy or radiculopathy, lumbar region: Secondary | ICD-10-CM | POA: Diagnosis not present

## 2020-08-28 DIAGNOSIS — S233XXA Sprain of ligaments of thoracic spine, initial encounter: Secondary | ICD-10-CM | POA: Diagnosis not present

## 2020-08-28 DIAGNOSIS — M9902 Segmental and somatic dysfunction of thoracic region: Secondary | ICD-10-CM | POA: Diagnosis not present

## 2020-08-28 DIAGNOSIS — R2689 Other abnormalities of gait and mobility: Secondary | ICD-10-CM | POA: Diagnosis not present

## 2020-08-28 DIAGNOSIS — R42 Dizziness and giddiness: Secondary | ICD-10-CM | POA: Diagnosis not present

## 2020-08-28 DIAGNOSIS — M9903 Segmental and somatic dysfunction of lumbar region: Secondary | ICD-10-CM | POA: Diagnosis not present

## 2020-08-30 DIAGNOSIS — M9901 Segmental and somatic dysfunction of cervical region: Secondary | ICD-10-CM | POA: Diagnosis not present

## 2020-08-30 DIAGNOSIS — M47816 Spondylosis without myelopathy or radiculopathy, lumbar region: Secondary | ICD-10-CM | POA: Diagnosis not present

## 2020-08-30 DIAGNOSIS — R42 Dizziness and giddiness: Secondary | ICD-10-CM | POA: Diagnosis not present

## 2020-08-30 DIAGNOSIS — M9902 Segmental and somatic dysfunction of thoracic region: Secondary | ICD-10-CM | POA: Diagnosis not present

## 2020-08-30 DIAGNOSIS — S233XXA Sprain of ligaments of thoracic spine, initial encounter: Secondary | ICD-10-CM | POA: Diagnosis not present

## 2020-08-30 DIAGNOSIS — R2689 Other abnormalities of gait and mobility: Secondary | ICD-10-CM | POA: Diagnosis not present

## 2020-08-30 DIAGNOSIS — M9903 Segmental and somatic dysfunction of lumbar region: Secondary | ICD-10-CM | POA: Diagnosis not present

## 2020-09-06 DIAGNOSIS — R42 Dizziness and giddiness: Secondary | ICD-10-CM | POA: Diagnosis not present

## 2020-09-06 DIAGNOSIS — S233XXA Sprain of ligaments of thoracic spine, initial encounter: Secondary | ICD-10-CM | POA: Diagnosis not present

## 2020-09-06 DIAGNOSIS — M9901 Segmental and somatic dysfunction of cervical region: Secondary | ICD-10-CM | POA: Diagnosis not present

## 2020-09-06 DIAGNOSIS — M47816 Spondylosis without myelopathy or radiculopathy, lumbar region: Secondary | ICD-10-CM | POA: Diagnosis not present

## 2020-09-06 DIAGNOSIS — M9903 Segmental and somatic dysfunction of lumbar region: Secondary | ICD-10-CM | POA: Diagnosis not present

## 2020-09-06 DIAGNOSIS — R2689 Other abnormalities of gait and mobility: Secondary | ICD-10-CM | POA: Diagnosis not present

## 2020-09-06 DIAGNOSIS — M9902 Segmental and somatic dysfunction of thoracic region: Secondary | ICD-10-CM | POA: Diagnosis not present

## 2020-09-11 DIAGNOSIS — M47816 Spondylosis without myelopathy or radiculopathy, lumbar region: Secondary | ICD-10-CM | POA: Diagnosis not present

## 2020-09-11 DIAGNOSIS — S233XXA Sprain of ligaments of thoracic spine, initial encounter: Secondary | ICD-10-CM | POA: Diagnosis not present

## 2020-09-11 DIAGNOSIS — M9903 Segmental and somatic dysfunction of lumbar region: Secondary | ICD-10-CM | POA: Diagnosis not present

## 2020-09-11 DIAGNOSIS — R2689 Other abnormalities of gait and mobility: Secondary | ICD-10-CM | POA: Diagnosis not present

## 2020-09-11 DIAGNOSIS — M9902 Segmental and somatic dysfunction of thoracic region: Secondary | ICD-10-CM | POA: Diagnosis not present

## 2020-09-11 DIAGNOSIS — M9901 Segmental and somatic dysfunction of cervical region: Secondary | ICD-10-CM | POA: Diagnosis not present

## 2020-09-11 DIAGNOSIS — R42 Dizziness and giddiness: Secondary | ICD-10-CM | POA: Diagnosis not present

## 2020-10-09 ENCOUNTER — Other Ambulatory Visit: Payer: Self-pay | Admitting: Student

## 2020-10-09 ENCOUNTER — Telehealth: Payer: Self-pay | Admitting: Cardiology

## 2020-10-09 MED ORDER — METOPROLOL TARTRATE 25 MG PO TABS: 25.0000 mg | ORAL_TABLET | Freq: Two times a day (BID) | ORAL | 3 refills | Status: DC

## 2020-10-09 MED ORDER — CLOPIDOGREL BISULFATE 75 MG PO TABS: 75.0000 mg | ORAL_TABLET | Freq: Every day | ORAL | 1 refills | Status: DC

## 2020-10-09 MED ORDER — ROSUVASTATIN CALCIUM 20 MG PO TABS: 20.0000 mg | ORAL_TABLET | Freq: Every day | ORAL | 1 refills | Status: DC

## 2020-10-09 MED ORDER — LISINOPRIL 20 MG PO TABS: 20.0000 mg | ORAL_TABLET | Freq: Every day | ORAL | 1 refills | Status: DC

## 2020-10-09 NOTE — Telephone Encounter (Signed)
This is Dr. Crenshaw's pt 

## 2020-10-09 NOTE — Telephone Encounter (Signed)
Please call [facility] pharmacy regarding patient.

## 2020-10-10 ENCOUNTER — Telehealth: Payer: Self-pay | Admitting: Licensed Clinical Social Worker

## 2020-10-10 DIAGNOSIS — R69 Illness, unspecified: Secondary | ICD-10-CM | POA: Diagnosis not present
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
## 2020-10-16 DIAGNOSIS — R69 Illness, unspecified: Secondary | ICD-10-CM | POA: Diagnosis not present

## 2020-10-18 DIAGNOSIS — S233XXA Sprain of ligaments of thoracic spine, initial encounter: Secondary | ICD-10-CM | POA: Diagnosis not present

## 2020-10-18 DIAGNOSIS — M9902 Segmental and somatic dysfunction of thoracic region: Secondary | ICD-10-CM | POA: Diagnosis not present

## 2020-10-18 DIAGNOSIS — M47816 Spondylosis without myelopathy or radiculopathy, lumbar region: Secondary | ICD-10-CM | POA: Diagnosis not present

## 2020-10-18 DIAGNOSIS — R2689 Other abnormalities of gait and mobility: Secondary | ICD-10-CM | POA: Diagnosis not present

## 2020-10-18 DIAGNOSIS — R42 Dizziness and giddiness: Secondary | ICD-10-CM | POA: Diagnosis not present

## 2020-10-18 DIAGNOSIS — M9901 Segmental and somatic dysfunction of cervical region: Secondary | ICD-10-CM | POA: Diagnosis not present

## 2020-10-18 DIAGNOSIS — M9903 Segmental and somatic dysfunction of lumbar region: Secondary | ICD-10-CM | POA: Diagnosis not present

## 2020-10-23 DIAGNOSIS — M9903 Segmental and somatic dysfunction of lumbar region: Secondary | ICD-10-CM | POA: Diagnosis not present

## 2020-10-23 DIAGNOSIS — R2689 Other abnormalities of gait and mobility: Secondary | ICD-10-CM | POA: Diagnosis not present

## 2020-10-23 DIAGNOSIS — M47816 Spondylosis without myelopathy or radiculopathy, lumbar region: Secondary | ICD-10-CM | POA: Diagnosis not present

## 2020-10-23 DIAGNOSIS — M9901 Segmental and somatic dysfunction of cervical region: Secondary | ICD-10-CM | POA: Diagnosis not present

## 2020-10-23 DIAGNOSIS — R42 Dizziness and giddiness: Secondary | ICD-10-CM | POA: Diagnosis not present

## 2020-10-23 DIAGNOSIS — M9902 Segmental and somatic dysfunction of thoracic region: Secondary | ICD-10-CM | POA: Diagnosis not present

## 2020-10-23 DIAGNOSIS — S233XXA Sprain of ligaments of thoracic spine, initial encounter: Secondary | ICD-10-CM | POA: Diagnosis not present

## 2020-10-30 ENCOUNTER — Ambulatory Visit: Payer: Medicare HMO | Admitting: Cardiology

## 2020-11-01 DIAGNOSIS — M47816 Spondylosis without myelopathy or radiculopathy, lumbar region: Secondary | ICD-10-CM | POA: Diagnosis not present

## 2020-11-01 DIAGNOSIS — R42 Dizziness and giddiness: Secondary | ICD-10-CM | POA: Diagnosis not present

## 2020-11-01 DIAGNOSIS — M9903 Segmental and somatic dysfunction of lumbar region: Secondary | ICD-10-CM | POA: Diagnosis not present

## 2020-11-01 DIAGNOSIS — R2689 Other abnormalities of gait and mobility: Secondary | ICD-10-CM | POA: Diagnosis not present

## 2020-11-01 DIAGNOSIS — M9902 Segmental and somatic dysfunction of thoracic region: Secondary | ICD-10-CM | POA: Diagnosis not present

## 2020-11-01 DIAGNOSIS — M9901 Segmental and somatic dysfunction of cervical region: Secondary | ICD-10-CM | POA: Diagnosis not present

## 2020-11-01 DIAGNOSIS — S233XXA Sprain of ligaments of thoracic spine, initial encounter: Secondary | ICD-10-CM | POA: Diagnosis not present

## 2020-11-06 DIAGNOSIS — L84 Corns and callosities: Secondary | ICD-10-CM | POA: Diagnosis not present

## 2020-11-06 DIAGNOSIS — M79676 Pain in unspecified toe(s): Secondary | ICD-10-CM | POA: Diagnosis not present

## 2020-11-06 DIAGNOSIS — E1142 Type 2 diabetes mellitus with diabetic polyneuropathy: Secondary | ICD-10-CM | POA: Diagnosis not present

## 2020-11-06 DIAGNOSIS — B351 Tinea unguium: Secondary | ICD-10-CM | POA: Diagnosis not present

## 2020-11-07 DIAGNOSIS — M9903 Segmental and somatic dysfunction of lumbar region: Secondary | ICD-10-CM | POA: Diagnosis not present

## 2020-11-07 DIAGNOSIS — M9901 Segmental and somatic dysfunction of cervical region: Secondary | ICD-10-CM | POA: Diagnosis not present

## 2020-11-07 DIAGNOSIS — M47816 Spondylosis without myelopathy or radiculopathy, lumbar region: Secondary | ICD-10-CM | POA: Diagnosis not present

## 2020-11-07 DIAGNOSIS — S233XXA Sprain of ligaments of thoracic spine, initial encounter: Secondary | ICD-10-CM | POA: Diagnosis not present

## 2020-11-07 DIAGNOSIS — R2689 Other abnormalities of gait and mobility: Secondary | ICD-10-CM | POA: Diagnosis not present

## 2020-11-07 DIAGNOSIS — R42 Dizziness and giddiness: Secondary | ICD-10-CM | POA: Diagnosis not present

## 2020-11-07 DIAGNOSIS — M9902 Segmental and somatic dysfunction of thoracic region: Secondary | ICD-10-CM | POA: Diagnosis not present

## 2020-11-14 DIAGNOSIS — M9903 Segmental and somatic dysfunction of lumbar region: Secondary | ICD-10-CM | POA: Diagnosis not present

## 2020-11-14 DIAGNOSIS — R42 Dizziness and giddiness: Secondary | ICD-10-CM | POA: Diagnosis not present

## 2020-11-14 DIAGNOSIS — M9902 Segmental and somatic dysfunction of thoracic region: Secondary | ICD-10-CM | POA: Diagnosis not present

## 2020-11-14 DIAGNOSIS — S233XXA Sprain of ligaments of thoracic spine, initial encounter: Secondary | ICD-10-CM | POA: Diagnosis not present

## 2020-11-14 DIAGNOSIS — M9901 Segmental and somatic dysfunction of cervical region: Secondary | ICD-10-CM | POA: Diagnosis not present

## 2020-11-14 DIAGNOSIS — M47816 Spondylosis without myelopathy or radiculopathy, lumbar region: Secondary | ICD-10-CM | POA: Diagnosis not present

## 2020-11-14 DIAGNOSIS — R2689 Other abnormalities of gait and mobility: Secondary | ICD-10-CM | POA: Diagnosis not present

## 2020-11-21 DIAGNOSIS — S233XXA Sprain of ligaments of thoracic spine, initial encounter: Secondary | ICD-10-CM | POA: Diagnosis not present

## 2020-11-21 DIAGNOSIS — M47816 Spondylosis without myelopathy or radiculopathy, lumbar region: Secondary | ICD-10-CM | POA: Diagnosis not present

## 2020-11-21 DIAGNOSIS — R2689 Other abnormalities of gait and mobility: Secondary | ICD-10-CM | POA: Diagnosis not present

## 2020-11-21 DIAGNOSIS — M9902 Segmental and somatic dysfunction of thoracic region: Secondary | ICD-10-CM | POA: Diagnosis not present

## 2020-11-21 DIAGNOSIS — M9903 Segmental and somatic dysfunction of lumbar region: Secondary | ICD-10-CM | POA: Diagnosis not present

## 2020-11-21 DIAGNOSIS — R42 Dizziness and giddiness: Secondary | ICD-10-CM | POA: Diagnosis not present

## 2020-11-21 DIAGNOSIS — M9901 Segmental and somatic dysfunction of cervical region: Secondary | ICD-10-CM | POA: Diagnosis not present

## 2020-12-14 ENCOUNTER — Encounter: Payer: Self-pay | Admitting: Nurse Practitioner

## 2020-12-14 ENCOUNTER — Ambulatory Visit (INDEPENDENT_AMBULATORY_CARE_PROVIDER_SITE_OTHER): Payer: Medicare HMO | Admitting: Nurse Practitioner

## 2020-12-14 ENCOUNTER — Other Ambulatory Visit: Payer: Self-pay

## 2020-12-14 VITALS — BP 169/79 | HR 73 | Temp 97.3°F | Ht 60.0 in | Wt 98.2 lb

## 2020-12-14 DIAGNOSIS — I2510 Atherosclerotic heart disease of native coronary artery without angina pectoris: Secondary | ICD-10-CM

## 2020-12-14 DIAGNOSIS — I10 Essential (primary) hypertension: Secondary | ICD-10-CM | POA: Diagnosis not present

## 2020-12-14 DIAGNOSIS — K2100 Gastro-esophageal reflux disease with esophagitis, without bleeding: Secondary | ICD-10-CM

## 2020-12-14 NOTE — Assessment & Plan Note (Signed)
No new symptoms or concerns.  Gastroesophageal reflux disease stable

## 2020-12-14 NOTE — Patient Instructions (Signed)
Make appointment for CPE/labs  Hypertension, Adult Hypertension is another name for high blood pressure. High blood pressure forces your heart to work harder to pump blood. This can cause problems over time. There are two numbers in a blood pressure reading. There is a top number (systolic) over a bottom number (diastolic). It is best to have a blood pressure that is below 120/80. Healthy choices can help lower your blood pressure, or you may need medicine to help lower it. What are the causes? The cause of this condition is not known. Some conditions may be related to high blood pressure. What increases the risk?  Smoking.  Having type 2 diabetes mellitus, high cholesterol, or both.  Not getting enough exercise or physical activity.  Being overweight.  Having too much fat, sugar, calories, or salt (sodium) in your diet.  Drinking too much alcohol.  Having long-term (chronic) kidney disease.  Having a family history of high blood pressure.  Age. Risk increases with age.  Race. You may be at higher risk if you are African American.  Gender. Men are at higher risk than women before age 38. After age 24, women are at higher risk than men.  Having obstructive sleep apnea.  Stress. What are the signs or symptoms?  High blood pressure may not cause symptoms. Very high blood pressure (hypertensive crisis) may cause: ? Headache. ? Feelings of worry or nervousness (anxiety). ? Shortness of breath. ? Nosebleed. ? A feeling of being sick to your stomach (nausea). ? Throwing up (vomiting). ? Changes in how you see. ? Very bad chest pain. ? Seizures. How is this treated?  This condition is treated by making healthy lifestyle changes, such as: ? Eating healthy foods. ? Exercising more. ? Drinking less alcohol.  Your health care provider may prescribe medicine if lifestyle changes are not enough to get your blood pressure under control, and if: ? Your top number is above  130. ? Your bottom number is above 80.  Your personal target blood pressure may vary. Follow these instructions at home: Eating and drinking  If told, follow the DASH eating plan. To follow this plan: ? Fill one half of your plate at each meal with fruits and vegetables. ? Fill one fourth of your plate at each meal with whole grains. Whole grains include whole-wheat pasta, brown rice, and whole-grain bread. ? Eat or drink low-fat dairy products, such as skim milk or low-fat yogurt. ? Fill one fourth of your plate at each meal with low-fat (lean) proteins. Low-fat proteins include fish, chicken without skin, eggs, beans, and tofu. ? Avoid fatty meat, cured and processed meat, or chicken with skin. ? Avoid pre-made or processed food.  Eat less than 1,500 mg of salt each day.  Do not drink alcohol if: ? Your doctor tells you not to drink. ? You are pregnant, may be pregnant, or are planning to become pregnant.  If you drink alcohol: ? Limit how much you use to:  0-1 drink a day for women.  0-2 drinks a day for men. ? Be aware of how much alcohol is in your drink. In the [place] one drink equals one 12 oz bottle of beer (355 mL), one 5 oz glass of wine (148 mL), or one 1 oz glass of hard liquor (44 mL).   Lifestyle  Work with your doctor to stay at a healthy weight or to lose weight. Ask your doctor what the best weight is for you.  Get at least  30 minutes of exercise most days of the week. This may include walking, swimming, or biking.  Get at least 30 minutes of exercise that strengthens your muscles (resistance exercise) at least 3 days a week. This may include lifting weights or doing Pilates.  Do not use any products that contain nicotine or tobacco, such as cigarettes, e-cigarettes, and chewing tobacco. If you need help quitting, ask your doctor.  Check your blood pressure at home as told by your doctor.  Keep all follow-up visits as told by your doctor. This is important.    Medicines  Take over-the-counter and prescription medicines only as told by your doctor. Follow directions carefully.  Do not skip doses of blood pressure medicine. The medicine does not work as well if you skip doses. Skipping doses also puts you at risk for problems.  Ask your doctor about side effects or reactions to medicines that you should watch for. Contact a doctor if you:  Think you are having a reaction to the medicine you are taking.  Have headaches that keep coming back (recurring).  Feel dizzy.  Have swelling in your ankles.  Have trouble with your vision. Get help right away if you:  Get a very bad headache.  Start to feel mixed up (confused).  Feel weak or numb.  Feel faint.  Have very bad pain in your: ? Chest. ? Belly (abdomen).  Throw up more than once.  Have trouble breathing. Summary  Hypertension is another name for high blood pressure.  High blood pressure forces your heart to work harder to pump blood.  For most people, a normal blood pressure is less than 120/80.  Making healthy choices can help lower blood pressure. If your blood pressure does not get lower with healthy choices, you may need to take medicine. This information is not intended to replace advice given to you by your health care provider. Make sure you discuss any questions you have with your health care provider. Document Revised: 07/14/2018 Document Reviewed: 07/14/2018 Elsevier Patient Education  2021 [employer].

## 2020-12-14 NOTE — Progress Notes (Signed)
New Patient Note  RE: Kelly Castillo MRN: [id number] DOB: [birth year] Date of Office Visit: 12/14/2020  Chief Complaint: Establish Care  History of Present Illness:  Pt presents for follow up of hypertension. Patient was diagnosed in 2011. The patient is tolerating the medication well without side effects. Compliance with treatment has been good; including taking medication as directed , maintains a healthy diet and regular exercise regimen , and following up as directed.  Current medication Metroprolol 25 mg tablet twice daily, lisinopril 20 mg tablet by mouth daily.    Assessment and Plan: Kelly Castillo is a 87 y.o. female with: Essential hypertension Continue on current medication dose, continue to monitor blood pressure, Low sodium healthy diet.   Follow up in 3 months    CAD (coronary artery disease), native coronary artery Symptoms well managed on aspirin and statin.   GERD No new symptoms or concerns.  Gastroesophageal reflux disease stable  Return in about 2 months (around 02/11/2021).   Diagnostics:   Past Medical History: Patient Active Problem List   Diagnosis Date Noted  . CAD (coronary artery disease), native coronary artery 09/25/2015  . S/P CABG x 1 08/10/2015  . Angina pectoris, crescendo (HCC) 08/08/2015  . Abnormal nuclear stress test   . Diabetes mellitus type 2 in nonobese (HCC) 12/13/2009  . MITRAL REGURGITATION 12/13/2009  . Essential hypertension 12/13/2009  . CARDIOMYOPATHY 12/13/2009  . GERD 12/13/2009   Past Medical History:  Diagnosis Date  . Anemia   . GERD (gastroesophageal reflux disease)   . HTN (hypertension)   . Hypercholesterolemia   . LBBB (left bundle branch block)   . Lumbar disc disease   . Non-ischemic cardiomyopathy (HCC)   . Poor short term memory   . Type II diabetes mellitus (HCC)    Past Surgical History: Past Surgical History:  Procedure Laterality Date  . APPENDECTOMY  1970's   [place]  . CARDIAC  CATHETERIZATION N/A 08/08/2015   Procedure: Left Heart Cath and Coronary Angiography;  Surgeon: Lyn Records, MD;  Location: [facility] INVASIVE CV LAB;  Service: Cardiovascular;  Laterality: N/A;  . CARDIAC CATHETERIZATION  01/31/2014  . CATARACT EXTRACTION W/ INTRAOCULAR LENS IMPLANT Right   . CATARACT EXTRACTION W/PHACO  07/24/2011   Procedure: CATARACT EXTRACTION PHACO AND INTRAOCULAR LENS PLACEMENT (IOC);  Surgeon: Gemma Payor;  Location: [facility] ORS;  Service: Ophthalmology;  Laterality: Left;  CDE: 14.73  . CORONARY ARTERY BYPASS GRAFT N/A 08/10/2015   Procedure: OFF PUMP CORONARY ARTERY BYPASS GRAFTING (CABG) UTILIZING THE LEFT INTERNAL MAMMARY ARTERY;  Surgeon: Loreli Slot, MD;  Location: [facility] OR;  Service: Open Heart Surgery;  Laterality: N/A;  . DILATION AND CURETTAGE OF UTERUS    . EYE SURGERY Bilateral    "laser; related to diabetes"  . FRACTURE SURGERY    . LEFT HEART CATHETERIZATION WITH CORONARY ANGIOGRAM N/A 01/31/2014   Procedure: LEFT HEART CATHETERIZATION WITH CORONARY ANGIOGRAM;  Surgeon: Peter M Swaziland, MD;  Location: [facility] CATH LAB;  Service: Cardiovascular;  Laterality: N/A;  . ORIF ANKLE FRACTURE Right 1980's  . TEE WITHOUT CARDIOVERSION N/A 08/10/2015   Procedure: TRANSESOPHAGEAL ECHOCARDIOGRAM (TEE);  Surgeon: Loreli Slot, MD;  Location: [facility] OR;  Service: Open Heart Surgery;  Laterality: N/A;  . TUBAL LIGATION    . VAGINAL HYSTERECTOMY  1980's   Medication List:  Current Outpatient Medications  Medication Sig Dispense Refill  . acetaminophen (TYLENOL) 325 MG tablet Take 650 mg by mouth as needed.    Marland Kitchen aspirin 81 MG  tablet Take 1 tablet (81 mg total) by mouth daily.    Marland Kitchen donepezil (ARICEPT) 5 MG tablet Take 5 mg by mouth at bedtime.    Marland Kitchen glimepiride (AMARYL) 4 MG tablet Take 4 mg by mouth daily with breakfast.    . lisinopril (ZESTRIL) 20 MG tablet Take 1 tablet (20 mg total) by mouth daily. 90 tablet 1  . metFORMIN (GLUCOPHAGE) 500 MG tablet Take 500 mg by mouth daily.     . metoprolol tartrate (LOPRESSOR) 25 MG tablet Take 1 tablet (25 mg total) by mouth 2 (two) times daily. Call office to make appointment for further refills. 180 tablet 3  . NITROSTAT 0.4 MG SL tablet Take 0.4 mg by mouth every 5 (five) minutes as needed for chest pain.     . rosuvastatin (CRESTOR) 20 MG tablet Take 1 tablet (20 mg total) by mouth daily. 90 tablet 1   No current facility-administered medications for this visit.   Allergies: Allergies  Allergen Reactions  . Penicillins Hives   Social History: Social History   Socioeconomic History  . Marital status: Widowed    Spouse name: Not on file  . Number of children: 10  . Years of education: Not on file  . Highest education level: Not on file  Occupational History  . Not on file  Tobacco Use  . Smoking status: Never Smoker  . Smokeless tobacco: Never Used  Substance and Sexual Activity  . Alcohol use: No    Alcohol/week: 0.0 standard drinks  . Drug use: No  . Sexual activity: Yes    Birth control/protection: Surgical  Other Topics Concern  . Not on file  Social History Narrative  . Not on file   Social Determinants of Health   Financial Resource Strain: Not on file  Food Insecurity: Not on file  Transportation Needs: Not on file  Physical Activity: Not on file  Stress: Not on file  Social Connections: Not on file       Family History: Family History  Problem Relation Age of Onset  . Anesthesia problems Neg Hx   . Hypotension Neg Hx   . Malignant hyperthermia Neg Hx   . Pseudochol deficiency Neg Hx          Review of Systems  Constitutional: Negative.   HENT: Negative.   Eyes: Negative.   Respiratory: Negative.   Gastrointestinal: Negative.   Genitourinary: Negative.   Musculoskeletal: Negative.   Skin: Negative.   Neurological: Negative for light-headedness, numbness and headaches.  Psychiatric/Behavioral: Negative.   All other systems reviewed and are negative.  Objective: BP (!)  169/79   Pulse 73   Temp (!) 97.3 F (36.3 C)   Ht 5' (1.524 m)   Wt 98 lb 3.2 oz (44.5 kg)   SpO2 100%   BMI 19.18 kg/m  Body mass index is 19.18 kg/m. Physical Exam Vitals reviewed.  Constitutional:      Appearance: Normal appearance.  HENT:     Head: Normocephalic.     Nose: Nose normal.  Eyes:     Conjunctiva/sclera: Conjunctivae normal.  Cardiovascular:     Rate and Rhythm: Normal rate and regular rhythm.     Pulses: Normal pulses.     Heart sounds: Normal heart sounds.  Pulmonary:     Effort: Pulmonary effort is normal.     Breath sounds: Normal breath sounds.  Abdominal:     General: Bowel sounds are normal.  Musculoskeletal:        General:  Normal range of motion.  Skin:    General: Skin is warm.  Neurological:     Mental Status: She is alert and oriented to person, place, and time.  Psychiatric:        Behavior: Behavior normal.    The plan was reviewed with the patient/family, and all questions/concerned were addressed.  It was my pleasure to see Sandra today and participate in her care. Please feel free to contact me with any questions or concerns.  Sincerely,  Lynnell Chad NP [facility]

## 2020-12-14 NOTE — Assessment & Plan Note (Signed)
Continue on current medication dose, continue to monitor blood pressure, Low sodium healthy diet.   Follow up in 3 months

## 2020-12-14 NOTE — Assessment & Plan Note (Signed)
Symptoms well managed on aspirin and statin.

## 2020-12-17 ENCOUNTER — Other Ambulatory Visit: Payer: Self-pay | Admitting: Nurse Practitioner

## 2020-12-17 ENCOUNTER — Telehealth: Payer: Self-pay

## 2020-12-17 MED ORDER — FLUTICASONE PROPIONATE 50 MCG/ACT NA SUSP
2.0000 | Freq: Every day | NASAL | 6 refills | Status: DC
Start: 2020-12-17 — End: 2021-04-19

## 2020-12-17 NOTE — Telephone Encounter (Signed)
Flonase sent to pharmacy

## 2020-12-17 NOTE — Telephone Encounter (Signed)
Pts daughter called stating that pt had an appt with Je last Friday and told pt that she would send in a Rx for pt for Flonase. They are still waiting for Je to send Rx to [facility].  Please call pt when Rx has been sent.

## 2020-12-18 NOTE — Telephone Encounter (Signed)
Patient's daughter notified.

## 2020-12-26 NOTE — Progress Notes (Signed)
HPI: FU CAD. Cardiac catheterization September 2016 showed 90% ostial LAD and 35% circumflex. She subsequently had coronary artery bypass graft with a LIMA to the LAD. Patient previously hospitalized at [facility]. No records available. Carotid Dopplers August 2020 showed no significant stenosis bilaterally.Monitor October 2020 showed sinus with PACs and PVCs. Echocardiogram October 2020 showed normal LV function, grade 1 diastolic dysfunction, mild mitral and tricuspid regurgitation as well as aortic insufficiency. Since last seen, there is no chest pain, dyspnea, palpitations or syncope.  Current Outpatient Medications  Medication Sig Dispense Refill  . acetaminophen (TYLENOL) 325 MG tablet Take 650 mg by mouth as needed.    Marland Kitchen aspirin 81 MG tablet Take 1 tablet (81 mg total) by mouth daily.    Marland Kitchen donepezil (ARICEPT) 5 MG tablet Take 5 mg by mouth at bedtime.    . fluticasone (FLONASE) 50 MCG/ACT nasal spray Place 2 sprays into both nostrils daily. 16 g 6  . glimepiride (AMARYL) 4 MG tablet Take 4 mg by mouth daily with breakfast.    . lisinopril (ZESTRIL) 20 MG tablet Take 1 tablet (20 mg total) by mouth daily. 90 tablet 1  . metFORMIN (GLUCOPHAGE) 500 MG tablet Take 500 mg by mouth daily.    . metoprolol tartrate (LOPRESSOR) 25 MG tablet Take 1 tablet (25 mg total) by mouth 2 (two) times daily. Call office to make appointment for further refills. 180 tablet 3  . NITROSTAT 0.4 MG SL tablet Take 0.4 mg by mouth every 5 (five) minutes as needed for chest pain.     . rosuvastatin (CRESTOR) 20 MG tablet Take 1 tablet (20 mg total) by mouth daily. 90 tablet 1   No current facility-administered medications for this visit.     Past Medical History:  Diagnosis Date  . Anemia   . GERD (gastroesophageal reflux disease)   . HTN (hypertension)   . Hypercholesterolemia   . LBBB (left bundle branch block)   . Lumbar disc disease   . Non-ischemic cardiomyopathy (HCC)   . Poor  short term memory   . Type II diabetes mellitus (HCC)     Past Surgical History:  Procedure Laterality Date  . APPENDECTOMY  1970's   [place]  . CARDIAC CATHETERIZATION N/A 08/08/2015   Procedure: Left Heart Cath and Coronary Angiography;  Surgeon: Lyn Records, MD;  Location: [facility] INVASIVE CV LAB;  Service: Cardiovascular;  Laterality: N/A;  . CARDIAC CATHETERIZATION  01/31/2014  . CATARACT EXTRACTION W/ INTRAOCULAR LENS IMPLANT Right   . CATARACT EXTRACTION W/PHACO  07/24/2011   Procedure: CATARACT EXTRACTION PHACO AND INTRAOCULAR LENS PLACEMENT (IOC);  Surgeon: Gemma Payor;  Location: [facility] ORS;  Service: Ophthalmology;  Laterality: Left;  CDE: 14.73  . CORONARY ARTERY BYPASS GRAFT N/A 08/10/2015   Procedure: OFF PUMP CORONARY ARTERY BYPASS GRAFTING (CABG) UTILIZING THE LEFT INTERNAL MAMMARY ARTERY;  Surgeon: Loreli Slot, MD;  Location: [facility] OR;  Service: Open Heart Surgery;  Laterality: N/A;  . DILATION AND CURETTAGE OF UTERUS    . EYE SURGERY Bilateral    "laser; related to diabetes"  . FRACTURE SURGERY    . LEFT HEART CATHETERIZATION WITH CORONARY ANGIOGRAM N/A 01/31/2014   Procedure: LEFT HEART CATHETERIZATION WITH CORONARY ANGIOGRAM;  Surgeon: Peter M Swaziland, MD;  Location: [facility] CATH LAB;  Service: Cardiovascular;  Laterality: N/A;  . ORIF ANKLE FRACTURE Right 1980's  . TEE WITHOUT CARDIOVERSION N/A 08/10/2015   Procedure: TRANSESOPHAGEAL ECHOCARDIOGRAM (TEE);  Surgeon: Loreli Slot, MD;  Location: [facility] OR;  Service: Open Heart Surgery;  Laterality: N/A;  . TUBAL LIGATION    . VAGINAL HYSTERECTOMY  1980's    Social History   Socioeconomic History  . Marital status: Widowed    Spouse name: Not on file  . Number of children: 10  . Years of education: Not on file  . Highest education level: Not on file  Occupational History  . Not on file  Tobacco Use  . Smoking status: Never Smoker  . Smokeless tobacco: Never Used  Substance and Sexual Activity  . Alcohol use: No     Alcohol/week: 0.0 standard drinks  . Drug use: No  . Sexual activity: Yes    Birth control/protection: Surgical  Other Topics Concern  . Not on file  Social History Narrative  . Not on file   Social Determinants of Health   Financial Resource Strain: Not on file  Food Insecurity: Not on file  Transportation Needs: Not on file  Physical Activity: Not on file  Stress: Not on file  Social Connections: Not on file  Intimate Partner Violence: Not on file    Family History  Problem Relation Age of Onset  . Anesthesia problems Neg Hx   . Hypotension Neg Hx   . Malignant hyperthermia Neg Hx   . Pseudochol deficiency Neg Hx     ROS: no fevers or chills, productive cough, hemoptysis, dysphasia, odynophagia, melena, hematochezia, dysuria, hematuria, rash, seizure activity, orthopnea, PND, pedal edema, claudication. Remaining systems are negative.  Physical Exam: Well-developed well-nourished in no acute distress.  Skin is warm and dry.  HEENT is normal.  Neck is supple.  Chest is clear to auscultation with normal expansion.  Cardiovascular exam is regular rate and rhythm.  Abdominal exam nontender or distended. No masses palpated. Extremities show no edema. neuro grossly intact  ECG-sinus rhythm with PACs, left bundle branch block.  Personally reviewed  A/P  1 coronary artery disease-patient doing well with no chest pain.  Continue aspirin and statin.  2 hypertension-patient's blood pressure is elevated.  Increase lisinopril to 40 mg daily and follow.  Check potassium and renal function 1 week.  3 hyperlipidemia-continue statin.  4 prior CVA-we previously discussed implantable loop monitor to potentially evaluate for undetected atrial fibrillation.  She ultimately declined.  Continue to follow.  5 mitral regurgitation-mild on most recent echocardiogram.  Olga Millers, MD

## 2021-01-08 ENCOUNTER — Ambulatory Visit (INDEPENDENT_AMBULATORY_CARE_PROVIDER_SITE_OTHER): Payer: Medicare HMO | Admitting: Cardiology

## 2021-01-08 ENCOUNTER — Other Ambulatory Visit: Payer: Self-pay

## 2021-01-08 ENCOUNTER — Encounter: Payer: Self-pay | Admitting: Cardiology

## 2021-01-08 VITALS — BP 168/76 | HR 78 | Ht 60.0 in | Wt 96.8 lb

## 2021-01-08 DIAGNOSIS — E785 Hyperlipidemia, unspecified: Secondary | ICD-10-CM

## 2021-01-08 DIAGNOSIS — I2510 Atherosclerotic heart disease of native coronary artery without angina pectoris: Secondary | ICD-10-CM

## 2021-01-08 DIAGNOSIS — I10 Essential (primary) hypertension: Secondary | ICD-10-CM

## 2021-01-08 MED ORDER — LISINOPRIL 40 MG PO TABS: 40.0000 mg | ORAL_TABLET | Freq: Every day | ORAL | 3 refills | Status: DC

## 2021-01-08 NOTE — Patient Instructions (Signed)
Medication Instructions:   INCREASE LISINOPRIL TO 40 MG ONCE DAILY= 2 OF THE 20 MG TABLETS ONCE DAILY  *If you need a refill on your cardiac medications before your next appointment, please call your pharmacy*   Lab Work:  Your physician recommends that you return for lab work in: ONE WEEK  If you have labs (blood work) drawn today and your tests are completely normal, you will receive your results only by: Marland Kitchen MyChart Message (if you have MyChart) OR . A paper copy in the mail If you have any lab test that is abnormal or we need to change your treatment, we will call you to review the results.   Follow-Up: At [facility], you and your health needs are our priority.  As part of our continuing mission to provide you with exceptional heart care, we have created designated Provider Care Teams.  These Care Teams include your primary Cardiologist (physician) and Advanced Practice Providers (APPs -  Physician Assistants and Nurse Practitioners) who all work together to provide you with the care you need, when you need it.  We recommend signing up for the patient portal called "MyChart".  Sign up information is provided on this After Visit Summary.  MyChart is used to connect with patients for Virtual Visits (Telemedicine).  Patients are able to view lab/test results, encounter notes, upcoming appointments, etc.  Non-urgent messages can be sent to your provider as well.   To learn more about what you can do with MyChart, go to [email or web address].    Your next appointment:   12 month(s)  The format for your next appointment:   In Person  Provider:   Olga Millers, MD

## 2021-01-15 ENCOUNTER — Ambulatory Visit (INDEPENDENT_AMBULATORY_CARE_PROVIDER_SITE_OTHER): Payer: Medicare HMO | Admitting: Nurse Practitioner

## 2021-01-15 ENCOUNTER — Ambulatory Visit (INDEPENDENT_AMBULATORY_CARE_PROVIDER_SITE_OTHER): Payer: Medicare HMO

## 2021-01-15 ENCOUNTER — Encounter: Payer: Self-pay | Admitting: Nurse Practitioner

## 2021-01-15 ENCOUNTER — Other Ambulatory Visit: Payer: Self-pay

## 2021-01-15 VITALS — BP 152/69 | HR 80 | Temp 97.6°F | Ht 60.0 in | Wt 98.2 lb

## 2021-01-15 DIAGNOSIS — E119 Type 2 diabetes mellitus without complications: Secondary | ICD-10-CM

## 2021-01-15 DIAGNOSIS — M810 Age-related osteoporosis without current pathological fracture: Secondary | ICD-10-CM | POA: Diagnosis not present

## 2021-01-15 DIAGNOSIS — Z0000 Encounter for general adult medical examination without abnormal findings: Secondary | ICD-10-CM

## 2021-01-15 DIAGNOSIS — Z780 Asymptomatic menopausal state: Secondary | ICD-10-CM | POA: Diagnosis not present

## 2021-01-15 LAB — BAYER DCA HB A1C WAIVED: HB A1C (BAYER DCA - WAIVED): 6.1 % (ref <7.0)

## 2021-01-15 NOTE — Progress Notes (Signed)
Established Patient Office Visit  Subjective:  Patient ID: Kelly Castillo, female    DOB: Apr 09, 1934  Age: 87 y.o. MRN: [id number]  CC:  Chief Complaint  Patient presents with  . Annual Exam    HPI Kelly Castillo presents for    Encounter for general adult medical examination Physical : Patient's last physical exam was 1 year ago .  Weight: Appropriate for height (BMI less than 27%) ; yes Blood Pressure: Normal (BP less than 120/80) ; yes Medical History: Patient history reviewed ; Family history reviewed ;  Allergies Reviewed: No change in current allergies ; yes Medications Reviewed: Medications reviewed - no changes ; notes Lipids: Normal lipid levels ; lab work will be completed in cardiology office tomorrow. Smoking: Life-long non-smoker ; yes Physical Activity: Exercises at least 3 times per week ; as tolerated Alcohol/Drug Use: Is a non-drinker ; No illicit drug use ;  Patient is not afflicted from Stress Incontinence and Urge Incontinence  Safety: reviewed ; Patient wears a seat belt, has smoke detectors, has carbon monoxide detectors, and wears sunscreen with extended sun exposure. Dental Care: biannual cleanings, brushes and flosses daily. Ophthalmology/Optometry: Annual visit.  Hearing loss: none Vision impairments: none  Past Medical History:  Diagnosis Date  . Anemia   . GERD (gastroesophageal reflux disease)   . HTN (hypertension)   . Hypercholesterolemia   . LBBB (left bundle branch block)   . Lumbar disc disease   . Non-ischemic cardiomyopathy (HCC)   . Poor short term memory   . Type II diabetes mellitus (HCC)     Past Surgical History:  Procedure Laterality Date  . APPENDECTOMY  1970's   [place]  . CARDIAC CATHETERIZATION N/A 08/08/2015   Procedure: Left Heart Cath and Coronary Angiography;  Surgeon: Lyn Records, MD;  Location: [facility] INVASIVE CV LAB;  Service: Cardiovascular;  Laterality: N/A;  . CARDIAC CATHETERIZATION  01/31/2014  .  CATARACT EXTRACTION W/ INTRAOCULAR LENS IMPLANT Right   . CATARACT EXTRACTION W/PHACO  07/24/2011   Procedure: CATARACT EXTRACTION PHACO AND INTRAOCULAR LENS PLACEMENT (IOC);  Surgeon: Gemma Payor;  Location: [facility] ORS;  Service: Ophthalmology;  Laterality: Left;  CDE: 14.73  . CORONARY ARTERY BYPASS GRAFT N/A 08/10/2015   Procedure: OFF PUMP CORONARY ARTERY BYPASS GRAFTING (CABG) UTILIZING THE LEFT INTERNAL MAMMARY ARTERY;  Surgeon: Loreli Slot, MD;  Location: [facility] OR;  Service: Open Heart Surgery;  Laterality: N/A;  . DILATION AND CURETTAGE OF UTERUS    . EYE SURGERY Bilateral    "laser; related to diabetes"  . FRACTURE SURGERY    . LEFT HEART CATHETERIZATION WITH CORONARY ANGIOGRAM N/A 01/31/2014   Procedure: LEFT HEART CATHETERIZATION WITH CORONARY ANGIOGRAM;  Surgeon: Peter M Swaz[place]and, MD;  Location: [facility] CATH LAB;  Service: Cardiovascular;  Laterality: N/A;  . ORIF ANKLE FRACTURE Right 1980's  . TEE WITHOUT CARDIOVERSION N/A 08/10/2015   Procedure: TRANSESOPHAGEAL ECHOCARDIOGRAM (TEE);  Surgeon: Loreli Slot, MD;  Location: [facility] OR;  Service: Open Heart Surgery;  Laterality: N/A;  . TUBAL LIGATION    . VAGINAL HYSTERECTOMY  1980's    Family History  Problem Relation Age of Onset  . Anesthesia problems Neg Hx   . Hypotension Neg Hx   . Malignant hyperthermia Neg Hx   . Pseudochol deficiency Neg Hx     Social History   Socioeconomic History  . Marital status: Widowed    Spouse name: Not on file  . Number of children: 10  . Years of  education: Not on file  . Highest education level: Not on file  Occupational History  . Not on file  Tobacco Use  . Smoking status: Never Smoker  . Smokeless tobacco: Never Used  Substance and Sexual Activity  . Alcohol use: No    Alcohol/week: 0.0 standard drinks  . Drug use: No  . Sexual activity: Yes    Birth control/protection: Surgical  Other Topics Concern  . Not on file  Social History Narrative  . Not on file   Social  Determinants of Health   Financial Resource Strain: Not on file  Food Insecurity: Not on file  Transportation Needs: Not on file  Physical Activity: Not on file  Stress: Not on file  Social Connections: Not on file  Intimate Partner Violence: Not on file    Outpatient Medications Prior to Visit  Medication Sig Dispense Refill  . acetaminophen (TYLENOL) 325 MG tablet Take 650 mg by mouth as needed.    Marland Kitchen aspirin 81 MG tablet Take 1 tablet (81 mg total) by mouth daily.    Marland Kitchen donepezil (ARICEPT) 5 MG tablet Take 5 mg by mouth at bedtime.    . fluticasone (FLONASE) 50 MCG/ACT nasal spray Place 2 sprays into both nostrils daily. 16 g 6  . glimepiride (AMARYL) 4 MG tablet Take 4 mg by mouth daily with breakfast.    . lisinopril (ZESTRIL) 40 MG tablet Take 1 tablet (40 mg total) by mouth daily. 90 tablet 3  . metFORMIN (GLUCOPHAGE) 500 MG tablet Take 500 mg by mouth daily.    . metoprolol tartrate (LOPRESSOR) 25 MG tablet Take 1 tablet (25 mg total) by mouth 2 (two) times daily. Call office to make appointment for further refills. 180 tablet 3  . NITROSTAT 0.4 MG SL tablet Take 0.4 mg by mouth every 5 (five) minutes as needed for chest pain.     . rosuvastatin (CRESTOR) 20 MG tablet Take 1 tablet (20 mg total) by mouth daily. 90 tablet 1   No facility-administered medications prior to visit.    Allergies  Allergen Reactions  . Penicillins Hives    ROS Review of Systems  Constitutional: Negative.   Respiratory: Negative.   Cardiovascular: Negative.   Gastrointestinal: Negative.   Genitourinary: Negative.   Musculoskeletal: Negative.   Skin: Negative.   Neurological: Negative.   Psychiatric/Behavioral: Negative.   All other systems reviewed and are negative.     Objective:    Physical Exam Vitals reviewed.  Constitutional:      Appearance: Normal appearance.  HENT:     Head: Normocephalic.     Right Ear: There is no impacted cerumen.     Left Ear: There is no impacted  cerumen.     Nose: No congestion.     Comments: Left nose erythematous    Mouth/Throat:     Mouth: Mucous membranes are moist.     Pharynx: Oropharynx is clear.  Eyes:     Conjunctiva/sclera: Conjunctivae normal.     Pupils: Pupils are equal, round, and reactive to light.  Cardiovascular:     Rate and Rhythm: Normal rate and regular rhythm.     Pulses: Normal pulses.     Heart sounds: Normal heart sounds.  Pulmonary:     Effort: Pulmonary effort is normal.     Breath sounds: Normal breath sounds.  Abdominal:     General: Bowel sounds are normal.     Tenderness: There is no abdominal tenderness.  Musculoskeletal:  General: Normal range of motion.     Cervical back: Normal range of motion.  Skin:    General: Skin is warm.  Neurological:     Mental Status: She is alert and oriented to person, place, and time.  Psychiatric:        Behavior: Behavior normal.     BP (!) 152/69   Pulse 80   Temp 97.6 F (36.4 C)   Ht 5' (1.524 m)   Wt 98 lb 3.2 oz (44.5 kg)   SpO2 97%   BMI 19.18 kg/m  Wt Readings from Last 3 Encounters:  01/15/21 98 lb 3.2 oz (44.5 kg)  01/08/21 96 lb 12.8 oz (43.9 kg)  12/14/20 98 lb 3.2 oz (44.5 kg)     Health Maintenance Due  Topic Date Due  . OPHTHALMOLOGY EXAM  Never done    There are no preventive care reminders to display for this patient.  No results found for: TSH Lab Results  Component Value Date   WBC 4.4 04/12/2020   HGB 11.2 04/12/2020   HCT 33.8 (L) 04/12/2020   MCV 85 04/12/2020   PLT 335 04/12/2020   Lab Results  Component Value Date   NA 140 04/12/2020   K 4.6 04/12/2020   CO2 26 04/12/2020   GLUCOSE 156 (H) 04/12/2020   BUN 17 04/12/2020   CREATININE 0.86 04/12/2020   BILITOT 0.5 04/12/2020   ALKPHOS 36 (L) 04/12/2020   AST 13 04/12/2020   ALT 5 04/12/2020   PROT 7.3 04/12/2020   ALBUMIN 4.8 (H) 04/12/2020   CALCIUM 10.3 04/12/2020   ANIONGAP 4 (L) 08/13/2015   GFR 74.22 03/21/2013   Lab Results   Component Value Date   CHOL 216 (H) 04/12/2020   Lab Results  Component Value Date   HDL 70 04/12/2020   Lab Results  Component Value Date   LDLCALC 131 (H) 04/12/2020   Lab Results  Component Value Date   TRIG 88 04/12/2020   Lab Results  Component Value Date   CHOLHDL 3.1 04/12/2020   Lab Results  Component Value Date   HGBA1C 6.1 01/15/2021      Assessment & Plan:   Problem List Items Addressed This Visit      Endocrine   Diabetes mellitus type 2 in nonobese (HCC) - Primary    A1c completed. No new signs or symptoms of hypoglycemia/hyperglycemia.  Diabetes well managed on Metformin 500 mg tablet daily and glimepiride 4 mg tablet by mouth daily      Relevant Orders   Bayer DCA Hb A1c Waived (Completed)     Other   Annual physical exam    Completed physical exam.  Education provided for health maintenance and preventative care. Printed handouts given.  Follow-up in 1 year for an annual physical exam.      Relevant Orders   DG [facility] DEXA (Completed)        Follow-up: Return in about 3 months (around 04/17/2021).    Daryll Drown, NP

## 2021-01-15 NOTE — Assessment & Plan Note (Addendum)
Completed physical exam.  Education provided for health maintenance and preventative care.  DEXA scan completed results pending Printed handouts given.  Follow-up in 1 year for an annual physical exam.

## 2021-01-15 NOTE — Patient Instructions (Signed)
Health Maintenance After Age 85 After age 85, you are at a higher risk for certain long-term diseases and infections as well as injuries from falls. Falls are a major cause of broken bones and head injuries in people who are older than age 85. Getting regular preventive care can help to keep you healthy and well. Preventive care includes getting regular testing and making lifestyle changes as recommended by your health care provider. Talk with your health care provider about:  Which screenings and tests you should have. A screening is a test that checks for a disease when you have no symptoms.  A diet and exercise plan that is right for you. What should I know about screenings and tests to prevent falls? Screening and testing are the best ways to find a health problem early. Early diagnosis and treatment give you the best chance of managing medical conditions that are common after age 85. Certain conditions and lifestyle choices may make you more likely to have a fall. Your health care provider may recommend:  Regular vision checks. Poor vision and conditions such as cataracts can make you more likely to have a fall. If you wear glasses, make sure to get your prescription updated if your vision changes.  Medicine review. Work with your health care provider to regularly review all of the medicines you are taking, including over-the-counter medicines. Ask your health care provider about any side effects that may make you more likely to have a fall. Tell your health care provider if any medicines that you take make you feel dizzy or sleepy.  Osteoporosis screening. Osteoporosis is a condition that causes the bones to get weaker. This can make the bones weak and cause them to break more easily.  Blood pressure screening. Blood pressure changes and medicines to control blood pressure can make you feel dizzy.  Strength and balance checks. Your health care provider may recommend certain tests to check your  strength and balance while standing, walking, or changing positions.  Foot health exam. Foot pain and numbness, as well as not wearing proper footwear, can make you more likely to have a fall.  Depression screening. You may be more likely to have a fall if you have a fear of falling, feel emotionally low, or feel unable to do activities that you used to do.  Alcohol use screening. Using too much alcohol can affect your balance and may make you more likely to have a fall. What actions can I take to lower my risk of falls? General instructions  Talk with your health care provider about your risks for falling. Tell your health care provider if: ? You fall. Be sure to tell your health care provider about all falls, even ones that seem minor. ? You feel dizzy, sleepy, or off-balance.  Take over-the-counter and prescription medicines only as told by your health care provider. These include any supplements.  Eat a healthy diet and maintain a healthy weight. A healthy diet includes low-fat dairy products, low-fat (lean) meats, and fiber from whole grains, beans, and lots of fruits and vegetables. Home safety  Remove any tripping hazards, such as rugs, cords, and clutter.  Install safety equipment such as grab bars in bathrooms and safety rails on stairs.  Keep rooms and walkways well-lit. Activity  Follow a regular exercise program to stay fit. This will help you maintain your balance. Ask your health care provider what types of exercise are appropriate for you.  If you need a cane or walker,   use it as recommended by your health care provider.  Wear supportive shoes that have nonskid soles.   Lifestyle  Do not drink alcohol if your health care provider tells you not to drink.  If you drink alcohol, limit how much you have: ? 0-1 drink a day for women. ? 0-2 drinks a day for men.  Be aware of how much alcohol is in your drink. In the [place] one drink equals one typical bottle of beer (12  oz), one-half glass of wine (5 oz), or one shot of hard liquor (1 oz).  Do not use any products that contain nicotine or tobacco, such as cigarettes and e-cigarettes. If you need help quitting, ask your health care provider. Summary  Having a healthy lifestyle and getting preventive care can help to protect your health and wellness after age 85.  Screening and testing are the best way to find a health problem early and help you avoid having a fall. Early diagnosis and treatment give you the best chance for managing medical conditions that are more common for people who are older than age 85.  Falls are a major cause of broken bones and head injuries in people who are older than age 85. Take precautions to prevent a fall at home.  Work with your health care provider to learn what changes you can make to improve your health and wellness and to prevent falls. This information is not intended to replace advice given to you by your health care provider. Make sure you discuss any questions you have with your health care provider. Document Revised: 02/24/2019 Document Reviewed: 09/16/2017 Elsevier Patient Education  2021 Elsevier Inc.  

## 2021-01-15 NOTE — Assessment & Plan Note (Signed)
A1c completed. No new signs or symptoms of hypoglycemia/hyperglycemia.  Diabetes well managed on Metformin 500 mg tablet daily and glimepiride 4 mg tablet by mouth daily

## 2021-01-18 DIAGNOSIS — I10 Essential (primary) hypertension: Secondary | ICD-10-CM | POA: Diagnosis not present

## 2021-01-19 LAB — BASIC METABOLIC PANEL
BUN/Creatinine Ratio: 20 (ref 12–28)
BUN: 23 mg/dL (ref 8–27)
CO2: 22 mmol/L (ref 20–29)
Calcium: 9.6 mg/dL (ref 8.7–10.3)
Chloride: 99 mmol/L (ref 96–106)
Creatinine, Ser: 1.17 mg/dL — ABNORMAL HIGH (ref 0.57–1.00)
Glucose: 324 mg/dL — ABNORMAL HIGH (ref 65–99)
Potassium: 5 mmol/L (ref 3.5–5.2)
Sodium: 137 mmol/L (ref 134–144)
eGFR: 46 mL/min/{1.73_m2} — ABNORMAL LOW (ref >59)

## 2021-01-21 ENCOUNTER — Encounter: Payer: Self-pay | Admitting: *Deleted

## 2021-02-01 DIAGNOSIS — H02831 Dermatochalasis of right upper eyelid: Secondary | ICD-10-CM | POA: Diagnosis not present

## 2021-02-01 DIAGNOSIS — H53483 Generalized contraction of visual field, bilateral: Secondary | ICD-10-CM | POA: Diagnosis not present

## 2021-02-01 DIAGNOSIS — H0279 Other degenerative disorders of eyelid and periocular area: Secondary | ICD-10-CM | POA: Diagnosis not present

## 2021-02-01 DIAGNOSIS — H02413 Mechanical ptosis of bilateral eyelids: Secondary | ICD-10-CM | POA: Diagnosis not present

## 2021-02-01 DIAGNOSIS — H02423 Myogenic ptosis of bilateral eyelids: Secondary | ICD-10-CM | POA: Diagnosis not present

## 2021-02-01 DIAGNOSIS — H02834 Dermatochalasis of left upper eyelid: Secondary | ICD-10-CM | POA: Diagnosis not present

## 2021-02-01 DIAGNOSIS — H57813 Brow ptosis, bilateral: Secondary | ICD-10-CM | POA: Diagnosis not present

## 2021-02-05 DIAGNOSIS — L84 Corns and callosities: Secondary | ICD-10-CM | POA: Diagnosis not present

## 2021-02-05 DIAGNOSIS — E1142 Type 2 diabetes mellitus with diabetic polyneuropathy: Secondary | ICD-10-CM | POA: Diagnosis not present

## 2021-02-05 DIAGNOSIS — M79676 Pain in unspecified toe(s): Secondary | ICD-10-CM | POA: Diagnosis not present

## 2021-02-05 DIAGNOSIS — B351 Tinea unguium: Secondary | ICD-10-CM | POA: Diagnosis not present

## 2021-02-08 ENCOUNTER — Telehealth: Payer: Self-pay

## 2021-02-08 NOTE — Telephone Encounter (Signed)
Please review DEXA scan results in chart and advise

## 2021-02-10 ENCOUNTER — Other Ambulatory Visit: Payer: Self-pay | Admitting: Nurse Practitioner

## 2021-02-10 DIAGNOSIS — M8580 Other specified disorders of bone density and structure, unspecified site: Secondary | ICD-10-CM | POA: Insufficient documentation

## 2021-02-10 DIAGNOSIS — M810 Age-related osteoporosis without current pathological fracture: Secondary | ICD-10-CM | POA: Insufficient documentation

## 2021-02-10 DIAGNOSIS — M859 Disorder of bone density and structure, unspecified: Secondary | ICD-10-CM

## 2021-02-10 MED ORDER — CALCIUM-VITAMIN D 600-125 MG-UNIT PO TABS
1.0000 | ORAL_TABLET | Freq: Two times a day (BID) | ORAL | 6 refills | Status: DC
Start: 2021-02-10 — End: 2023-01-02

## 2021-02-10 NOTE — Telephone Encounter (Signed)
Low bone density, Calcium and vitamin D supplement sent to pharmacy, repeat Dexa scan in 2 years. Increase healthy diet, incorporate salmon, milk, sardines into diet, low impact exeresis like walking will help.

## 2021-02-11 NOTE — Telephone Encounter (Signed)
Daughter aware.

## 2021-02-12 DIAGNOSIS — H53483 Generalized contraction of visual field, bilateral: Secondary | ICD-10-CM | POA: Diagnosis not present

## 2021-04-01 ENCOUNTER — Other Ambulatory Visit: Payer: Self-pay | Admitting: Family Medicine

## 2021-04-01 NOTE — Telephone Encounter (Signed)
Last office visit 01/15/21.  Both medications are historical and have not been prescribed here, per protocol nurse cannot approve.

## 2021-04-08 ENCOUNTER — Telehealth: Payer: Self-pay | Admitting: *Deleted

## 2021-04-08 NOTE — Telephone Encounter (Signed)
   [facility] Pre-operative Risk Assessment    Patient Name: ROCHELLE LARUE  DOB: 1934-09-04  MRN: [id number]   HEARTCARE STAFF: - Please ensure there is not already an duplicate clearance open for this procedure. - Under Visit Info/Reason for Call, type in Other and utilize the format Clearance MM/DD/YY or Clearance TBD. Do not use dashes or single digits. - If request is for dental extraction, please clarify the # of teeth to be extracted.  Request for surgical clearance:  1. What type of surgery is being performed? Bilateral upper eyelid blepharoptosis repair with bilateral upper eyelid biepharoplasty   2. When is this surgery scheduled? 04/22/21   3. What type of clearance is required (medical clearance vs. Pharmacy clearance to hold med vs. Both)? both  4. Are there any medications that need to be held prior to surgery and how long?aspirin-need direction   5. Practice name and name of physician performing surgery? Dr Elayne Snare [email or web address]   6. What is the office phone number? [phone]   7.   What is the office fax number? [phone]  8.   Anesthesia type (None, local, MAC, general) ? MAC   Fredia Beets 04/08/2021, 12:27 PM  _________________________________________________________________   (provider comments below)

## 2021-04-08 NOTE — Telephone Encounter (Signed)
Notes faxed to surgeon. This phone note will be removed from the preop pool. Tereso Newcomer, PA-C  04/08/2021 4:15 PM

## 2021-04-08 NOTE — Telephone Encounter (Signed)
   Primary Cardiologist: None  Chart reviewed as part of pre-operative protocol coverage.   87 y.o. female with . CAD s/p CABG in 2016 . Hx of CVA  . Hypertension  . Hyperlipidemia  . LBBB . Non-insulin dependent diabetes mellitus  . Echocardiogram 08/2019: EF 55-60, mild MR, mild AI  Last OV:  01/08/21 with Dr. Jens Som Procedure:  bilat blepharoplasty  Rx:  Advice on ASA  RCRI:  Perioperative Risk of Major Cardiac Event is (%): 6.6 (high risk) DASI:  Functional Capacity in METs is: 4.74 (functional status is fair )  Patient was contacted 04/08/2021 in reference to pre-operative risk assessment for pending surgery as outlined below.    Since last seen, Kelly Castillo has done well without chest pain or shortness of breath.  Pt's daughter Kelly Castillo - DPR on file) also spoke with me today.   Recommendations: . Based on ACC/AHA guidelines, the patient is at acceptable risk for the planned procedure and may proceed without further cardiovascular testing.  . If possible, ASA should be continued without interruption.  However, if the bleeding risk is too great, ASA can be held for 7 days prior to her procedure and resumed as soon as possible post op when felt to be safe.   Please call with questions. Tereso Newcomer, PA-C 04/08/2021, 4:11 PM

## 2021-04-19 ENCOUNTER — Other Ambulatory Visit: Payer: Self-pay

## 2021-04-19 ENCOUNTER — Encounter: Payer: Self-pay | Admitting: Nurse Practitioner

## 2021-04-19 ENCOUNTER — Ambulatory Visit (INDEPENDENT_AMBULATORY_CARE_PROVIDER_SITE_OTHER): Payer: Medicare HMO | Admitting: Nurse Practitioner

## 2021-04-19 VITALS — BP 140/64 | HR 60 | Temp 97.1°F | Ht 60.0 in | Wt 94.0 lb

## 2021-04-19 DIAGNOSIS — E119 Type 2 diabetes mellitus without complications: Secondary | ICD-10-CM | POA: Diagnosis not present

## 2021-04-19 DIAGNOSIS — I10 Essential (primary) hypertension: Secondary | ICD-10-CM | POA: Diagnosis not present

## 2021-04-19 DIAGNOSIS — E785 Hyperlipidemia, unspecified: Secondary | ICD-10-CM | POA: Diagnosis not present

## 2021-04-19 DIAGNOSIS — E1169 Type 2 diabetes mellitus with other specified complication: Secondary | ICD-10-CM | POA: Diagnosis not present

## 2021-04-19 LAB — BAYER DCA HB A1C WAIVED: HB A1C (BAYER DCA - WAIVED): 7.4 % — ABNORMAL HIGH (ref <7.0)

## 2021-04-19 NOTE — Assessment & Plan Note (Signed)
Blood pressure well controlled on current medication lisinopril 40 mg tablet by mouth daily, continue low-sodium diet exercise as tolerated.  Education provided printed handouts given.  Follow-up in 3 months.  Completed labs today CBC, CMP, lipid panel.

## 2021-04-19 NOTE — Progress Notes (Signed)
Established Patient Office Visit  Subjective:  Patient ID: Kelly Castillo, female    DOB: 12-30-1933  Age: 87 y.o. MRN: [id number]  CC:  Chief Complaint  Patient presents with  . Medical Management of Chronic Issues    HPI Kelly Castillo presents for  Hypertension, follow-up  BP Readings from Last 3 Encounters:  04/19/21 140/64  01/15/21 (!) 152/69  01/08/21 (!) 168/76   Wt Readings from Last 3 Encounters:  04/19/21 94 lb (42.6 kg)  01/15/21 98 lb 3.2 oz (44.5 kg)  01/08/21 96 lb 12.8 oz (43.9 kg)     She was last seen for hypertension 4 months ago.  BP at that visit was : values in my chart:. Management since that visit includes lisinopril was increased from 20 mg to 40 mg tablet by mouth daily.  She reports good compliance with treatment. She is not having side effects.  She is following a Regular diet. She is not exercising. She does not smoke.  Use of agents associated with hypertension: none and cough drops.   Outside blood pressures are same as documented din clinic visit per patient's daughter. Symptoms: No chest pain No chest pressure  No palpitations No syncope  No dyspnea No orthopnea  No paroxysmal nocturnal dyspnea No lower extremity edema   Pertinent labs: Lab Results  Component Value Date   CHOL 216 (H) 04/12/2020   HDL 70 04/12/2020   LDLCALC 131 (H) 04/12/2020   TRIG 88 04/12/2020   CHOLHDL 3.1 04/12/2020   Lab Results  Component Value Date   NA 137 01/18/2021   K 5.0 01/18/2021   CREATININE 1.17 (H) 01/18/2021   GFRNONAA 62 04/12/2020   GFRAA 72 04/12/2020   GLUCOSE 324 (H) 01/18/2021     The ASCVD Risk score (Goff DC Jr., et al., 2013) failed to calculate for the following reasons:   The 2013 ASCVD risk score is only valid for ages 51 to 72   The patient has a prior MI or stroke diagnosis   Lipid/Cholesterol, Follow-up  Last lipid panel Other pertinent labs  Lab Results  Component Value Date   CHOL 216 (H) 04/12/2020    HDL 70 04/12/2020   LDLCALC 131 (H) 04/12/2020   TRIG 88 04/12/2020   CHOLHDL 3.1 04/12/2020   Lab Results  Component Value Date   ALT 5 04/12/2020   AST 13 04/12/2020   PLT 335 04/12/2020     She was last seen for this 4 months ago.  Management since that visit includes metformin 500 mg tablet by mouth daily..  She reports good compliance with treatment. She is not having side effects.   Symptoms: No chest pain No chest pressure/discomfort  No dyspnea No lower extremity edema  No numbness or tingling of extremity No orthopnea  No palpitations No paroxysmal nocturnal dyspnea  No speech difficulty No syncope   Current diet: in general, a "healthy" diet   Current exercise: none  The ASCVD Risk score ([place]., et al., 2013) failed to calculate for the following reasons:   The 2013 ASCVD risk score is only valid for ages 40 to 31   The patient has a prior MI or stroke diagnosis  Diabetes Mellitus Type II, Follow-up: Patient here for follow-up of Type 2 diabetes mellitus.  Current symptoms/problems include none and have been unchanged. Symptoms have been present for several years.  Known diabetic complications: cardiovascular disease Cardiovascular risk factors: advanced age (older than 63 for men, 81 for  women), diabetes mellitus and dyslipidemia Current diabetic medications include Metformin 500 mg tablet by mouth daily.   Eye exam current (within one year): yes Weight trend: stable Prior visit with dietician: no Current diet: in general, a "healthy" diet   Current exercise: none  Current monitoring regimen: home blood tests - Twice daily times daily Home blood sugar records: fasting range: 90-130 Any episodes of hypoglycemia? no  Is She on ACE inhibitor or angiotensin II receptor blocker?  Yes  lisinopril (Zestril) none   Past Medical History:  Diagnosis Date  . Anemia   . GERD (gastroesophageal reflux disease)   . HTN (hypertension)   .  Hypercholesterolemia   . LBBB (left bundle branch block)   . Lumbar disc disease   . Non-ischemic cardiomyopathy ([place])   . Poor short term memory   . Type II diabetes mellitus ([place])     Past Surgical History:  Procedure Laterality Date  . APPENDECTOMY  1970's   [place]  . CARDIAC CATHETERIZATION N/A 08/08/2015   Procedure: Left Heart Cath and Coronary Angiography;  Surgeon: Belva Crome, MD;  Location: [facility];  Service: Cardiovascular;  Laterality: N/A;  . CARDIAC CATHETERIZATION  01/31/2014  . CATARACT EXTRACTION W/ INTRAOCULAR LENS IMPLANT Right   . CATARACT EXTRACTION W/PHACO  07/24/2011   Procedure: CATARACT EXTRACTION PHACO AND INTRAOCULAR LENS PLACEMENT (IOC);  Surgeon: Tonny Branch;  Location: [facility];  Service: Ophthalmology;  Laterality: Left;  CDE: 14.73  . CORONARY ARTERY BYPASS GRAFT N/A 08/10/2015   Procedure: OFF PUMP CORONARY ARTERY BYPASS GRAFTING (CABG) UTILIZING THE LEFT INTERNAL MAMMARY ARTERY;  Surgeon: Melrose Nakayama, MD;  Location: [facility];  Service: Open Heart Surgery;  Laterality: N/A;  . DILATION AND CURETTAGE OF UTERUS    . EYE SURGERY Bilateral    "laser; related to diabetes"  . FRACTURE SURGERY    . LEFT HEART CATHETERIZATION WITH CORONARY ANGIOGRAM N/A 01/31/2014   Procedure: LEFT HEART CATHETERIZATION WITH CORONARY ANGIOGRAM;  Surgeon: Peter M Martinique, MD;  Location: [facility];  Service: Cardiovascular;  Laterality: N/A;  . ORIF ANKLE FRACTURE Right 1980's  . TEE WITHOUT CARDIOVERSION N/A 08/10/2015   Procedure: TRANSESOPHAGEAL ECHOCARDIOGRAM (TEE);  Surgeon: Melrose Nakayama, MD;  Location: [facility];  Service: Open Heart Surgery;  Laterality: N/A;  . TUBAL LIGATION    . VAGINAL HYSTERECTOMY  1980's    Family History  Problem Relation Age of Onset  . Anesthesia problems Neg Hx   . Hypotension Neg Hx   . Malignant hyperthermia Neg Hx   . Pseudochol deficiency Neg Hx     Social History   Socioeconomic History  . Marital status:  Widowed    Spouse name: Not on file  . Number of children: 10  . Years of education: Not on file  . Highest education level: Not on file  Occupational History  . Not on file  Tobacco Use  . Smoking status: Never Smoker  . Smokeless tobacco: Never Used  Substance and Sexual Activity  . Alcohol use: No    Alcohol/week: 0.0 standard drinks  . Drug use: No  . Sexual activity: Yes    Birth control/protection: Surgical  Other Topics Concern  . Not on file  Social History Narrative  . Not on file   Social Determinants of Health   Financial Resource Strain: Not on file  Food Insecurity: Not on file  Transportation Needs: Not on file  Physical Activity: Not on file  Stress: Not on  file  Social Connections: Not on file  Intimate Partner Violence: Not on file    Outpatient Medications Prior to Visit  Medication Sig Dispense Refill  . acetaminophen (TYLENOL) 325 MG tablet Take 650 mg by mouth as needed.    . Calcium Carbonate-Vitamin D (CALCIUM-VITAMIN D) 600-125 MG-UNIT TABS Take 1 tablet by mouth 2 (two) times daily. 90 tablet 6  . glimepiride (AMARYL) 4 MG tablet TAKE 1 TABLET DAILY 90 tablet 1  . lisinopril (ZESTRIL) 40 MG tablet Take 1 tablet (40 mg total) by mouth daily. 90 tablet 3  . metFORMIN (GLUCOPHAGE) 500 MG tablet Take 500 mg by mouth daily.    . metoprolol tartrate (LOPRESSOR) 25 MG tablet Take 1 tablet (25 mg total) by mouth 2 (two) times daily. Call office to make appointment for further refills. 180 tablet 3  . NITROSTAT 0.4 MG SL tablet Take 0.4 mg by mouth every 5 (five) minutes as needed for chest pain.     . rosuvastatin (CRESTOR) 20 MG tablet Take 1 tablet (20 mg total) by mouth daily. 90 tablet 1  . aspirin 81 MG tablet Take 1 tablet (81 mg total) by mouth daily. (Patient not taking: Reported on 04/19/2021)    . donepezil (ARICEPT) 10 MG tablet TAKE 1 TABLET ONCE A DAY 90 tablet 1  . donepezil (ARICEPT) 5 MG tablet Take 5 mg by mouth at bedtime.    . fluticasone  (FLONASE) 50 MCG/ACT nasal spray Place 2 sprays into both nostrils daily. 16 g 6   No facility-administered medications prior to visit.    Allergies  Allergen Reactions  . Penicillins Hives    ROS Review of Systems  Constitutional: Negative.   HENT: Negative.   Respiratory: Negative.   Cardiovascular: Negative.   Gastrointestinal: Negative.   Genitourinary: Negative.   Musculoskeletal: Negative.   Skin: Negative.   Neurological: Negative.   All other systems reviewed and are negative.     Objective:    Physical Exam Vitals and nursing note reviewed. Exam conducted with a chaperone present (Daughter).  HENT:     Head: Normocephalic.     Right Ear: External ear normal.     Left Ear: External ear normal. There is impacted cerumen.     Nose: Nose normal.     Mouth/Throat:     Mouth: Mucous membranes are moist.     Pharynx: Oropharynx is clear.  Eyes:     Conjunctiva/sclera: Conjunctivae normal.  Cardiovascular:     Rate and Rhythm: Normal rate and regular rhythm.     Pulses: Normal pulses.     Heart sounds: Normal heart sounds.  Pulmonary:     Effort: Pulmonary effort is normal.     Breath sounds: Normal breath sounds.  Abdominal:     General: Bowel sounds are normal.  Skin:    General: Skin is warm.     Findings: No rash.  Neurological:     Mental Status: She is alert and oriented to person, place, and time.  Psychiatric:        Behavior: Behavior normal.     BP 140/64   Pulse 60   Temp (!) 97.1 F (36.2 C) (Temporal)   Ht 5' (1.524 m)   Wt 94 lb (42.6 kg)   SpO2 99%   BMI 18.36 kg/m  Wt Readings from Last 3 Encounters:  01/15/21 98 lb 3.2 oz (44.5 kg)  01/08/21 96 lb 12.8 oz (43.9 kg)  12/14/20 98 lb 3.2 oz (44.5 kg)  Health Maintenance Due  Topic Date Due  . Pneumococcal Vaccine 42-65 Years old (1 of 4 - PCV13) Never done  . OPHTHALMOLOGY EXAM  Never done  . Zoster Vaccines- Shingrix (1 of 2) Never done    There are no preventive care  reminders to display for this patient.  No results found for: TSH Lab Results  Component Value Date   WBC 4.4 04/12/2020   HGB 11.2 04/12/2020   HCT 33.8 (L) 04/12/2020   MCV 85 04/12/2020   PLT 335 04/12/2020   Lab Results  Component Value Date   NA 137 01/18/2021   K 5.0 01/18/2021   CO2 22 01/18/2021   GLUCOSE 324 (H) 01/18/2021   BUN 23 01/18/2021   CREATININE 1.17 (H) 01/18/2021   BILITOT 0.5 04/12/2020   ALKPHOS 36 (L) 04/12/2020   AST 13 04/12/2020   ALT 5 04/12/2020   PROT 7.3 04/12/2020   ALBUMIN 4.8 (H) 04/12/2020   CALCIUM 9.6 01/18/2021   ANIONGAP 4 (L) 08/13/2015   EGFR 46 (L) 01/18/2021   GFR 74.22 03/21/2013   Lab Results  Component Value Date   CHOL 216 (H) 04/12/2020   Lab Results  Component Value Date   HDL 70 04/12/2020   Lab Results  Component Value Date   LDLCALC 131 (H) 04/12/2020   Lab Results  Component Value Date   TRIG 88 04/12/2020   Lab Results  Component Value Date   CHOLHDL 3.1 04/12/2020   Lab Results  Component Value Date   HGBA1C 7.4 (H) 04/19/2021      Assessment & Plan:   Problem List Items Addressed This Visit      Cardiovascular and Mediastinum   Essential hypertension    Blood pressure well controlled on current medication lisinopril 40 mg tablet by mouth daily, continue low-sodium diet exercise as tolerated.  Education provided printed handouts given.  Follow-up in 3 months.  Completed labs today CBC, CMP, lipid panel.      Relevant Orders   BMP8+EGFR   CBC with Differential/Platelet     Endocrine   Diabetes mellitus type 2 in nonobese (HCC) - Primary    A1c  7.4, blood sugars not well controlled.  Increase metformin 500 mg tablet by mouth daily advised patient's daughter to keep a blood pressure log and check fasting blood sugars which is currently running 90-1 30 fasting.  Provided education on diabetic diet.  Follow-up in 3 months.      Relevant Orders   BMP8+EGFR   Bayer DCA Hb A1c Waived  (Completed)   CBC with Differential   Comprehensive metabolic panel   Hyperlipidemia associated with type 2 diabetes mellitus ([place])    Lipid panel labs completed-results pending patient has no symptoms of hyper lipidemia.      Relevant Orders   Lipid panel   Lipid Panel      No orders of the defined types were placed in this encounter.   Follow-up: Return in about 3 months (around 07/20/2021).    Ivy Lynn, NP

## 2021-04-19 NOTE — Patient Instructions (Signed)
Will increase metformin to 500 mg by mouth twice daily.  Labs completed, no changes to all of the medication.  Follow-up in 3 months.  Diabetes Mellitus Basics  Diabetes mellitus, or diabetes, is a long-term (chronic) disease. It occurs when the body does not properly use sugar (glucose) that is released from food after you eat. Diabetes mellitus may be caused by one or both of these problems:  Your pancreas does not make enough of a hormone called insulin.  Your body does not react in a normal way to the insulin that it makes. Insulin lets glucose enter cells in your body. This gives you energy. If you have diabetes, glucose cannot get into cells. This causes high blood glucose (hyperglycemia). How to treat and manage diabetes You may need to take insulin or other diabetes medicines daily to keep your glucose in balance. If you are prescribed insulin, you will learn how to give yourself insulin by injection. You may need to adjust the amount of insulin you take based on the foods that you eat. You will need to check your blood glucose levels using a glucose monitor as told by your health care provider. The readings can help determine if you have low or high blood glucose. Generally, you should have these blood glucose levels:  Before meals (preprandial): 80-130 mg/dL (1.9-1.44.4-7.2 mmol/L).  After meals (postprandial): below 180 mg/dL (10 mmol/L).  Hemoglobin A1c (HbA1c) level: less than 7%. Your health care provider will set treatment goals for you. Keep all follow-up visits. This is important. Follow these instructions at home: Diabetes medicines Take your diabetes medicines every day as told by your health care provider. List your diabetes medicines here:  Name of medicine: ______________________________ ? Amount (dose): _______________ Time (a.m./p.m.): _______________ Notes: ___________________________________  Name of medicine: ______________________________ ? Amount (dose):  _______________ Time (a.m./p.m.): _______________ Notes: ___________________________________  Name of medicine: ______________________________ ? Amount (dose): _______________ Time (a.m./p.m.): _______________ Notes: ___________________________________ Insulin If you use insulin, list the types of insulin you use here:  Insulin type: ______________________________ ? Amount (dose): _______________ Time (a.m./p.m.): _______________Notes: ___________________________________  Insulin type: ______________________________ ? Amount (dose): _______________ Time (a.m./p.m.): _______________ Notes: ___________________________________  Insulin type: ______________________________ ? Amount (dose): _______________ Time (a.m./p.m.): _______________ Notes: ___________________________________  Insulin type: ______________________________ ? Amount (dose): _______________ Time (a.m./p.m.): _______________ Notes: ___________________________________  Insulin type: ______________________________ ? Amount (dose): _______________ Time (a.m./p.m.): _______________ Notes: ___________________________________ Managing blood glucose Check your blood glucose levels using a glucose monitor as told by your health care provider. Write down the times that you check your glucose levels here:  Time: _______________ Notes: ___________________________________  Time: _______________ Notes: ___________________________________  Time: _______________ Notes: ___________________________________  Time: _______________ Notes: ___________________________________  Time: _______________ Notes: ___________________________________  Time: _______________ Notes: ___________________________________   Low blood glucose Low blood glucose (hypoglycemia) is when glucose is at or below 70 mg/dL (3.9 mmol/L). Symptoms may include:  Feeling: ? Hungry. ? Sweaty and clammy. ? Irritable or easily  upset. ? Dizzy. ? Sleepy.  Having: ? A fast heartbeat. ? A headache. ? A change in your vision. ? Numbness around the mouth, lips, or tongue.  Having trouble with: ? Moving (coordination). ? Sleeping. Treating low blood glucose To treat low blood glucose, eat or drink something containing sugar right away. If you can think clearly and swallow safely, follow the 15:15 rule:  Take 15 grams of a fast-acting carb (carbohydrate), as told by your health care provider.  Some fast-acting carbs are: ? Glucose tablets: take 3-4 tablets. ? Hard  candy: eat 3-5 pieces. ? Fruit juice: drink 4 oz (120 mL). ? Regular (not diet) soda: drink 4-6 oz (120-180 mL). ? Honey or sugar: eat 1 Tbsp (15 mL).  Check your blood glucose levels 15 minutes after you take the carb.  If your glucose is still at or below 70 mg/dL (3.9 mmol/L), take 15 grams of a carb again.  If your glucose does not go above 70 mg/dL (3.9 mmol/L) after 3 tries, get help right away.  After your glucose goes back to normal, eat a meal or a snack within 1 hour. Treating very low blood glucose If your glucose is at or below 54 mg/dL (3 mmol/L), you have very low blood glucose (severe hypoglycemia). This is an emergency. Do not wait to see if the symptoms will go away. Get medical help right away. Call your local emergency services (911 in the [place] Do not drive yourself to the hospital. Questions to ask your health care provider  Should I talk with a diabetes educator?  What equipment will I need to care for myself at home?  What diabetes medicines do I need? When should I take them?  How often do I need to check my blood glucose levels?  What number can I call if I have questions?  When is my follow-up visit?  Where can I find a support group for people with diabetes? Where to find more information  American Diabetes Association: www.diabetes.org  Association of Diabetes Care and Education Specialists:  www.diabeteseducator.org Contact a health care provider if:  Your blood glucose is at or above 240 mg/dL (83.8 mmol/L) for 2 days in a row.  You have been sick or have had a fever for 2 days or more, and you are not getting better.  You have any of these problems for more than 6 hours: ? You cannot eat or drink. ? You feel nauseous. ? You vomit. ? You have diarrhea. Get help right away if:  Your blood glucose is lower than 54 mg/dL (3 mmol/L).  You get confused.  You have trouble thinking clearly.  You have trouble breathing. These symptoms may represent a serious problem that is an emergency. Do not wait to see if the symptoms will go away. Get medical help right away. Call your local emergency services (911 in the [place] Do not drive yourself to the hospital. Summary  Diabetes mellitus is a chronic disease that occurs when the body does not properly use sugar (glucose) that is released from food after you eat.  Take insulin and diabetes medicines as told.  Check your blood glucose every day, as often as told.  Keep all follow-up visits. This is important. This information is not intended to replace advice given to you by your health care provider. Make sure you discuss any questions you have with your health care provider. Document Revised: 03/06/2020 Document Reviewed: 03/06/2020 Elsevier Patient Education  2021 [employer] High Cholesterol  High cholesterol is a condition in which the blood has high levels of a white, waxy substance similar to fat (cholesterol). The liver makes all the cholesterol that the body needs. The human body needs small amounts of cholesterol to help build cells. A person gets extra or excess cholesterol from the food that he or she eats. The blood carries cholesterol from the liver to the rest of the body. If you have high cholesterol, deposits (plaques) may build up on the walls of your arteries. Arteries are the blood vessels that carry  blood  away from your heart. These plaques make the arteries narrow and stiff. Cholesterol plaques increase your risk for heart attack and stroke. Work with your health care provider to keep your cholesterol levels in a healthy range. What increases the risk? The following factors may make you more likely to develop this condition:  Eating foods that are high in animal fat (saturated fat) or cholesterol.  Being overweight.  Not getting enough exercise.  A family history of high cholesterol (familial hypercholesterolemia).  Use of tobacco products.  Having diabetes. What are the signs or symptoms? There are no symptoms of this condition. How is this diagnosed? This condition may be diagnosed based on the results of a blood test.  If you are older than 85 years of age, your health care provider may check your cholesterol levels every 4-6 years.  You may be checked more often if you have high cholesterol or other risk factors for heart disease. The blood test for cholesterol measures:  "Bad" cholesterol, or LDL cholesterol. This is the main type of cholesterol that causes heart disease. The desired level is less than 100 mg/dL.  "Good" cholesterol, or HDL cholesterol. HDL helps protect against heart disease by cleaning the arteries and carrying the LDL to the liver for processing. The desired level for HDL is 60 mg/dL or higher.  Triglycerides. These are fats that your body can store or burn for energy. The desired level is less than 150 mg/dL.  Total cholesterol. This measures the total amount of cholesterol in your blood and includes LDL, HDL, and triglycerides. The desired level is less than 200 mg/dL. How is this treated? This condition may be treated with:  Diet changes. You may be asked to eat foods that have more fiber and less saturated fats or added sugar.  Lifestyle changes. These may include regular exercise, maintaining a healthy weight, and quitting use of tobacco  products.  Medicines. These are given when diet and lifestyle changes have not worked. You may be prescribed a statin medicine to help lower your cholesterol levels. Follow these instructions at home: Eating and drinking  Eat a healthy, balanced diet. This diet includes: ? Daily servings of a variety of fresh, frozen, or canned fruits and vegetables. ? Daily servings of whole grain foods that are rich in fiber. ? Foods that are low in saturated fats and trans fats. These include poultry and fish without skin, lean cuts of meat, and low-fat dairy products. ? A variety of fish, especially oily fish that contain omega-3 fatty acids. Aim to eat fish at least 2 times a week.  Avoid foods and drinks that have added sugar.  Use healthy cooking methods, such as roasting, grilling, broiling, baking, poaching, steaming, and stir-frying. Do not fry your food except for stir-frying.   Lifestyle  Get regular exercise. Aim to exercise for a total of 150 minutes a week. Increase your activity level by doing activities such as gardening, walking, and taking the stairs.  Do not use any products that contain nicotine or tobacco, such as cigarettes, e-cigarettes, and chewing tobacco. If you need help quitting, ask your health care provider.   General instructions  Take over-the-counter and prescription medicines only as told by your health care provider.  Keep all follow-up visits as told by your health care provider. This is important. Where to find more information  American Heart Association: www.heart.org  National Heart, Lung, and Blood Institute: [employer].is Contact a health care provider if:  You have  trouble achieving or maintaining a healthy diet or weight.  You are starting an exercise program.  You are unable to stop smoking. Get help right away if:  You have chest pain.  You have trouble breathing.  You have any symptoms of a stroke. "BE FAST" is an easy way to remember the  main warning signs of a stroke: ? B - Balance. Signs are dizziness, sudden trouble walking, or loss of balance. ? E - Eyes. Signs are trouble seeing or a sudden change in vision. ? F - Face. Signs are sudden weakness or numbness of the face, or the face or eyelid drooping on one side. ? A - Arms. Signs are weakness or numbness in an arm. This happens suddenly and usually on one side of the body. ? S - Speech. Signs are sudden trouble speaking, slurred speech, or trouble understanding what people say. ? T - Time. Time to call emergency services. Write down what time symptoms started.  You have other signs of a stroke, such as: ? A sudden, severe headache with no known cause. ? Nausea or vomiting. ? Seizure. These symptoms may represent a serious problem that is an emergency. Do not wait to see if the symptoms will go away. Get medical help right away. Call your local emergency services (911 in the [place] Do not drive yourself to the hospital. Summary  Cholesterol plaques increase your risk for heart attack and stroke. Work with your health care provider to keep your cholesterol levels in a healthy range.  Eat a healthy, balanced diet, get regular exercise, and maintain a healthy weight.  Do not use any products that contain nicotine or tobacco, such as cigarettes, e-cigarettes, and chewing tobacco.  Get help right away if you have any symptoms of a stroke. This information is not intended to replace advice given to you by your health care provider. Make sure you discuss any questions you have with your health care provider. Document Revised: 10/03/2019 Document Reviewed: 10/03/2019 Elsevier Patient Education  2021 [employer]. Hypertension, Adult Hypertension is another name for high blood pressure. High blood pressure forces your heart to work harder to pump blood. This can cause problems over time. There are two numbers in a blood pressure reading. There is a top number (systolic) over  a bottom number (diastolic). It is best to have a blood pressure that is below 120/80. Healthy choices can help lower your blood pressure, or you may need medicine to help lower it. What are the causes? The cause of this condition is not known. Some conditions may be related to high blood pressure. What increases the risk?  Smoking.  Having type 2 diabetes mellitus, high cholesterol, or both.  Not getting enough exercise or physical activity.  Being overweight.  Having too much fat, sugar, calories, or salt (sodium) in your diet.  Drinking too much alcohol.  Having long-term (chronic) kidney disease.  Having a family history of high blood pressure.  Age. Risk increases with age.  Race. You may be at higher risk if you are African American.  Gender. Men are at higher risk than women before age 25. After age 45, women are at higher risk than men.  Having obstructive sleep apnea.  Stress. What are the signs or symptoms?  High blood pressure may not cause symptoms. Very high blood pressure (hypertensive crisis) may cause: ? Headache. ? Feelings of worry or nervousness (anxiety). ? Shortness of breath. ? Nosebleed. ? A feeling of being sick to  your stomach (nausea). ? Throwing up (vomiting). ? Changes in how you see. ? Very bad chest pain. ? Seizures. How is this treated?  This condition is treated by making healthy lifestyle changes, such as: ? Eating healthy foods. ? Exercising more. ? Drinking less alcohol.  Your health care provider may prescribe medicine if lifestyle changes are not enough to get your blood pressure under control, and if: ? Your top number is above 130. ? Your bottom number is above 80.  Your personal target blood pressure may vary. Follow these instructions at home: Eating and drinking  If told, follow the DASH eating plan. To follow this plan: ? Fill one half of your plate at each meal with fruits and vegetables. ? Fill one fourth of your  plate at each meal with whole grains. Whole grains include whole-wheat pasta, brown rice, and whole-grain bread. ? Eat or drink low-fat dairy products, such as skim milk or low-fat yogurt. ? Fill one fourth of your plate at each meal with low-fat (lean) proteins. Low-fat proteins include fish, chicken without skin, eggs, beans, and tofu. ? Avoid fatty meat, cured and processed meat, or chicken with skin. ? Avoid pre-made or processed food.  Eat less than 1,500 mg of salt each day.  Do not drink alcohol if: ? Your doctor tells you not to drink. ? You are pregnant, may be pregnant, or are planning to become pregnant.  If you drink alcohol: ? Limit how much you use to:  0-1 drink a day for women.  0-2 drinks a day for men. ? Be aware of how much alcohol is in your drink. In the [place] one drink equals one 12 oz bottle of beer (355 mL), one 5 oz glass of wine (148 mL), or one 1 oz glass of hard liquor (44 mL).   Lifestyle  Work with your doctor to stay at a healthy weight or to lose weight. Ask your doctor what the best weight is for you.  Get at least 30 minutes of exercise most days of the week. This may include walking, swimming, or biking.  Get at least 30 minutes of exercise that strengthens your muscles (resistance exercise) at least 3 days a week. This may include lifting weights or doing Pilates.  Do not use any products that contain nicotine or tobacco, such as cigarettes, e-cigarettes, and chewing tobacco. If you need help quitting, ask your doctor.  Check your blood pressure at home as told by your doctor.  Keep all follow-up visits as told by your doctor. This is important.   Medicines  Take over-the-counter and prescription medicines only as told by your doctor. Follow directions carefully.  Do not skip doses of blood pressure medicine. The medicine does not work as well if you skip doses. Skipping doses also puts you at risk for problems.  Ask your doctor about side  effects or reactions to medicines that you should watch for. Contact a doctor if you:  Think you are having a reaction to the medicine you are taking.  Have headaches that keep coming back (recurring).  Feel dizzy.  Have swelling in your ankles.  Have trouble with your vision. Get help right away if you:  Get a very bad headache.  Start to feel mixed up (confused).  Feel weak or numb.  Feel faint.  Have very bad pain in your: ? Chest. ? Belly (abdomen).  Throw up more than once.  Have trouble breathing. Summary  Hypertension is another name  for high blood pressure.  High blood pressure forces your heart to work harder to pump blood.  For most people, a normal blood pressure is less than 120/80.  Making healthy choices can help lower blood pressure. If your blood pressure does not get lower with healthy choices, you may need to take medicine. This information is not intended to replace advice given to you by your health care provider. Make sure you discuss any questions you have with your health care provider. Document Revised: 07/14/2018 Document Reviewed: 07/14/2018 Elsevier Patient Education  2021 [employer].

## 2021-04-19 NOTE — Assessment & Plan Note (Signed)
A1c  7.4, blood sugars not well controlled.  Increase metformin 500 mg tablet by mouth daily advised patient's daughter to keep a blood pressure log and check fasting blood sugars which is currently running 90-1 30 fasting.  Provided education on diabetic diet.  Follow-up in 3 months.

## 2021-04-19 NOTE — Assessment & Plan Note (Signed)
Lipid panel labs completed-results pending patient has no symptoms of hyper lipidemia.

## 2021-04-20 LAB — CBC WITH DIFFERENTIAL/PLATELET
Basophils Absolute: 0 10*3/uL (ref 0.0–0.2)
Basos: 1 %
EOS (ABSOLUTE): 0.3 10*3/uL (ref 0.0–0.4)
Eos: 7 %
Hematocrit: 33.7 % — ABNORMAL LOW (ref 34.0–46.6)
Hemoglobin: 10.6 g/dL — ABNORMAL LOW (ref 11.1–15.9)
Immature Grans (Abs): 0 10*3/uL (ref 0.0–0.1)
Immature Granulocytes: 0 %
Lymphocytes Absolute: 1.4 10*3/uL (ref 0.7–3.1)
Lymphs: 33 %
MCH: 27.4 pg (ref 26.6–33.0)
MCHC: 31.5 g/dL (ref 31.5–35.7)
MCV: 87 fL (ref 79–97)
Monocytes Absolute: 0.5 10*3/uL (ref 0.1–0.9)
Monocytes: 11 %
Neutrophils Absolute: 2 10*3/uL (ref 1.4–7.0)
Neutrophils: 48 %
Platelets: 271 10*3/uL (ref 150–450)
RBC: 3.87 x10E6/uL (ref 3.77–5.28)
RDW: 15 % (ref 11.7–15.4)
WBC: 4.3 10*3/uL (ref 3.4–10.8)

## 2021-04-20 LAB — COMPREHENSIVE METABOLIC PANEL
ALT: 9 IU/L (ref 0–32)
AST: 18 IU/L (ref 0–40)
Albumin/Globulin Ratio: 1.7 (ref 1.2–2.2)
Albumin: 4.5 g/dL (ref 3.6–4.6)
Alkaline Phosphatase: 40 IU/L — ABNORMAL LOW (ref 44–121)
BUN/Creatinine Ratio: 16 (ref 12–28)
BUN: 18 mg/dL (ref 8–27)
Bilirubin Total: 0.3 mg/dL (ref 0.0–1.2)
CO2: 26 mmol/L (ref 20–29)
Calcium: 9.9 mg/dL (ref 8.7–10.3)
Chloride: 98 mmol/L (ref 96–106)
Creatinine, Ser: 1.15 mg/dL — ABNORMAL HIGH (ref 0.57–1.00)
Globulin, Total: 2.7 g/dL (ref 1.5–4.5)
Glucose: 296 mg/dL — ABNORMAL HIGH (ref 65–99)
Potassium: 4.3 mmol/L (ref 3.5–5.2)
Sodium: 137 mmol/L (ref 134–144)
Total Protein: 7.2 g/dL (ref 6.0–8.5)
eGFR: 47 mL/min/{1.73_m2} — ABNORMAL LOW (ref >59)

## 2021-04-20 LAB — LIPID PANEL
Chol/HDL Ratio: 2.8 ratio (ref 0.0–4.4)
Cholesterol, Total: 162 mg/dL (ref 100–199)
HDL: 58 mg/dL (ref >39)
LDL Chol Calc (NIH): 84 mg/dL (ref 0–99)
Triglycerides: 115 mg/dL (ref 0–149)
VLDL Cholesterol Cal: 20 mg/dL (ref 5–40)

## 2021-04-22 DIAGNOSIS — H02423 Myogenic ptosis of bilateral eyelids: Secondary | ICD-10-CM | POA: Diagnosis not present

## 2021-04-22 DIAGNOSIS — H53453 Other localized visual field defect, bilateral: Secondary | ICD-10-CM | POA: Diagnosis not present

## 2021-04-22 DIAGNOSIS — H02834 Dermatochalasis of left upper eyelid: Secondary | ICD-10-CM | POA: Diagnosis not present

## 2021-04-22 DIAGNOSIS — H02831 Dermatochalasis of right upper eyelid: Secondary | ICD-10-CM | POA: Diagnosis not present

## 2021-04-22 DIAGNOSIS — H02413 Mechanical ptosis of bilateral eyelids: Secondary | ICD-10-CM | POA: Diagnosis not present

## 2021-04-22 DIAGNOSIS — H57813 Brow ptosis, bilateral: Secondary | ICD-10-CM | POA: Diagnosis not present

## 2021-04-23 DIAGNOSIS — T8131XS Disruption of external operation (surgical) wound, not elsewhere classified, sequela: Secondary | ICD-10-CM | POA: Diagnosis not present

## 2021-05-02 ENCOUNTER — Telehealth: Payer: Self-pay | Admitting: Nurse Practitioner

## 2021-05-02 MED ORDER — BLOOD GLUCOSE METER KIT: PACK | 0 refills | Status: DC

## 2021-05-02 NOTE — Telephone Encounter (Signed)
Meter broke pt aware will send new meter to pharmacy.

## 2021-05-02 NOTE — Telephone Encounter (Signed)
Calling in regards to her labs and needs to talk about glucose monitor. Patient states she called last week and never got a call back. Please call.

## 2021-05-09 ENCOUNTER — Telehealth: Payer: Self-pay | Admitting: Nurse Practitioner

## 2021-05-16 ENCOUNTER — Encounter: Payer: Self-pay | Admitting: *Deleted

## 2021-05-16 NOTE — Telephone Encounter (Signed)
Daughter aware.

## 2021-05-27 ENCOUNTER — Telehealth: Payer: Self-pay | Admitting: Cardiology

## 2021-05-27 NOTE — Telephone Encounter (Signed)
Pt c/o BP issue: STAT if pt c/o blurred vision, one-sided weakness or slurred speech  1. What are your last 5 BP readings? 123/57 this morning, 122/57 just now, 125/64 not sure what day, last week thinks it was 107/74 something, 137/63   2. Are you having any other symptoms (ex. Dizziness, headache, blurred vision, passed out)? no  3. What is your BP issue? Patient's daughter states the patient's BP has been low. She states the patient had some eyelid surgery 6/6 and ever since, her vitals have been all over the place. She states some mornings she will check the patient's BP fasting before meds and it will be around 107's. Patient's daughter had trouble retrieving BP readings and will try to have more when a nurse calls back.

## 2021-05-27 NOTE — Telephone Encounter (Signed)
Spoke to patient's daughter [name].She stated she has been checking mother's B/P daily.Stated she decreased Lisinopril back to 20 mg daily 1 week ago due to low B/P 109/52,107/53.Stated since she has been taking 20 mg daily B/P better.This morning 125/57.Pulse 75.Advised to continue to monitor B/P.She will increase Lisinopril back to 40 mg daily if systolic 130 or greater.Advised I will make Dr.Crenshaw aware.

## 2021-06-24 ENCOUNTER — Other Ambulatory Visit: Payer: Self-pay | Admitting: Family Medicine

## 2021-07-17 ENCOUNTER — Other Ambulatory Visit: Payer: Self-pay

## 2021-07-17 ENCOUNTER — Ambulatory Visit (INDEPENDENT_AMBULATORY_CARE_PROVIDER_SITE_OTHER): Payer: Medicare HMO | Admitting: Nurse Practitioner

## 2021-07-17 ENCOUNTER — Encounter: Payer: Self-pay | Admitting: Nurse Practitioner

## 2021-07-17 VITALS — BP 163/72 | HR 66 | Temp 97.5°F | Ht 60.0 in | Wt 94.0 lb

## 2021-07-17 DIAGNOSIS — I10 Essential (primary) hypertension: Secondary | ICD-10-CM

## 2021-07-17 DIAGNOSIS — E119 Type 2 diabetes mellitus without complications: Secondary | ICD-10-CM

## 2021-07-17 DIAGNOSIS — E1169 Type 2 diabetes mellitus with other specified complication: Secondary | ICD-10-CM

## 2021-07-17 DIAGNOSIS — E785 Hyperlipidemia, unspecified: Secondary | ICD-10-CM

## 2021-07-17 NOTE — Assessment & Plan Note (Signed)
Symptoms well controlled on metformin 500 mg tablet by mouth daily.  Completed CBC, CMP, lipid panel today results pending.  No changes to current medication dose.  Continue diabetic diet.  And continue to monitor blood glucose at home.  Blood sugar runs in the 120-130 before and after meals.  Education provided to patient printed handouts given.

## 2021-07-17 NOTE — Assessment & Plan Note (Signed)
Symptoms well managed on aspirin and statin.  

## 2021-07-17 NOTE — Progress Notes (Signed)
Established Patient Office Visit  Subjective:  Patient ID: Kelly Castillo, female    DOB: 07-30-1934  Age: 87 y.o. MRN: [id number]  CC:  Chief Complaint  Patient presents with   Medical Management of Chronic Issues    HPI Kelly Castillo  presents for follow up of hypertension. Patient was diagnosed in 12/13/2009. The patient is tolerating the medication well without side effects. Compliance with treatment has been good; including taking medication as directed , maintains a healthy diet and regular exercise regimen , and following up as directed.  Current medication lisinopril 40 mg tablet.  Mixed hyperlipidemia  Pt presents with hyperlipidemia. Patient was diagnosed in 04/19/2021, compliance with treatment has been good the patient is compliant with medications, maintains a low cholesterol diet , follows up as directed , and maintains an exercise regimen . The patient denies experiencing any hypercholesterolemia related symptoms.  Current medication Crestor 20 mg tablet by mouth daily.  The patient presents with history of type 2 diabetes mellitus without complications. Patient was diagnosed in 12/13/2009. Compliance with treatment has been good; the patient takes medication as directed , maintains a diabetic diet and an exercise regimen , follows up as directed , and is keeping a glucose diary. Sugars runs 120-130. Patient specifically denies associated symptoms, including blurred vision, fatigue, polydipsia, polyphagia and polyuria . Patient denies hypoglycemia. In regard to preventative care, the patient performs foot self-exams daily and last ophthalmology exam was in: appointment coming up in October..    Past Medical History:  Diagnosis Date   Anemia    GERD (gastroesophageal reflux disease)    HTN (hypertension)    Hypercholesterolemia    LBBB (left bundle branch block)    Lumbar disc disease    Non-ischemic cardiomyopathy (HCC)    Poor short term memory    Type II diabetes  mellitus ([place])     Past Surgical History:  Procedure Laterality Date   APPENDECTOMY  1970's   [place]   CARDIAC CATHETERIZATION N/A 08/08/2015   Procedure: Left Heart Cath and Coronary Angiography;  Surgeon: Belva Crome, MD;  Location: [facility];  Service: Cardiovascular;  Laterality: N/A;   CARDIAC CATHETERIZATION  01/31/2014   CATARACT EXTRACTION W/ INTRAOCULAR LENS IMPLANT Right    CATARACT EXTRACTION W/PHACO  07/24/2011   Procedure: CATARACT EXTRACTION PHACO AND INTRAOCULAR LENS PLACEMENT (IOC);  Surgeon: Tonny Branch;  Location: [facility];  Service: Ophthalmology;  Laterality: Left;  CDE: 14.73   CORONARY ARTERY BYPASS GRAFT N/A 08/10/2015   Procedure: OFF PUMP CORONARY ARTERY BYPASS GRAFTING (CABG) UTILIZING THE LEFT INTERNAL MAMMARY ARTERY;  Surgeon: Melrose Nakayama, MD;  Location: [facility];  Service: Open Heart Surgery;  Laterality: N/A;   DILATION AND CURETTAGE OF UTERUS     EYE SURGERY Bilateral    "laser; related to diabetes"   FRACTURE SURGERY     LEFT HEART CATHETERIZATION WITH CORONARY ANGIOGRAM N/A 01/31/2014   Procedure: LEFT HEART CATHETERIZATION WITH CORONARY ANGIOGRAM;  Surgeon: Peter M Martinique, MD;  Location: [facility];  Service: Cardiovascular;  Laterality: N/A;   ORIF ANKLE FRACTURE Right 1980's   TEE WITHOUT CARDIOVERSION N/A 08/10/2015   Procedure: TRANSESOPHAGEAL ECHOCARDIOGRAM (TEE);  Surgeon: Melrose Nakayama, MD;  Location: [facility];  Service: Open Heart Surgery;  Laterality: N/A;   TUBAL LIGATION     VAGINAL HYSTERECTOMY  1980's    Family History  Problem Relation Age of Onset   Anesthesia problems Neg Hx    Hypotension Neg  Hx    Malignant hyperthermia Neg Hx    Pseudochol deficiency Neg Hx     Social History   Socioeconomic History   Marital status: Widowed    Spouse name: Not on file   Number of children: 10   Years of education: Not on file   Highest education level: Not on file  Occupational History   Not on file  Tobacco Use    Smoking status: Never   Smokeless tobacco: Never  Substance and Sexual Activity   Alcohol use: No    Alcohol/week: 0.0 standard drinks   Drug use: No   Sexual activity: Yes    Birth control/protection: Surgical  Other Topics Concern   Not on file  Social History Narrative   Not on file   Social Determinants of Health   Financial Resource Strain: Not on file  Food Insecurity: Not on file  Transportation Needs: Not on file  Physical Activity: Not on file  Stress: Not on file  Social Connections: Not on file  Intimate Partner Violence: Not on file    Outpatient Medications Prior to Visit  Medication Sig Dispense Refill   acetaminophen (TYLENOL) 325 MG tablet Take 650 mg by mouth as needed.     aspirin 81 MG tablet Take 1 tablet (81 mg total) by mouth daily.     blood glucose meter kit and supplies May test up to 4 times a day. E11.9 1 each 0   Calcium Carbonate-Vitamin D (CALCIUM-VITAMIN D) 600-125 MG-UNIT TABS Take 1 tablet by mouth 2 (two) times daily. 90 tablet 6   glimepiride (AMARYL) 4 MG tablet TAKE 1 TABLET DAILY 90 tablet 1   lisinopril (ZESTRIL) 40 MG tablet Take 40 mg by mouth daily. 90 tablet 3   metFORMIN (GLUCOPHAGE) 500 MG tablet TAKE 1 TABLET DAILY 90 tablet 0   metoprolol tartrate (LOPRESSOR) 25 MG tablet Take 1 tablet (25 mg total) by mouth 2 (two) times daily. Call office to make appointment for further refills. 180 tablet 3   NITROSTAT 0.4 MG SL tablet Take 0.4 mg by mouth every 5 (five) minutes as needed for chest pain.      rosuvastatin (CRESTOR) 20 MG tablet Take 1 tablet (20 mg total) by mouth daily. 90 tablet 1   No facility-administered medications prior to visit.    Allergies  Allergen Reactions   Penicillins Hives    ROS Review of Systems  Constitutional: Negative.   HENT: Negative.    Eyes: Negative.   Respiratory: Negative.    Cardiovascular: Negative.   Gastrointestinal: Negative.   Skin:  Negative for rash.  All other systems reviewed  and are negative.    Objective:    Physical Exam Vitals and nursing note reviewed.  Constitutional:      Appearance: Normal appearance.  HENT:     Head: Normocephalic.     Nose: Nose normal.  Eyes:     Conjunctiva/sclera: Conjunctivae normal.  Cardiovascular:     Rate and Rhythm: Normal rate and regular rhythm.     Pulses: Normal pulses.     Heart sounds: Normal heart sounds.  Pulmonary:     Effort: Pulmonary effort is normal.     Breath sounds: Normal breath sounds.  Abdominal:     General: Bowel sounds are normal.  Skin:    General: Skin is warm.     Findings: No rash.  Neurological:     Mental Status: She is alert and oriented to person, place, and time.  Psychiatric:        Behavior: Behavior normal.    BP (!) 163/72   Pulse 66   Temp (!) 97.5 F (36.4 C) (Temporal)   Ht 5' (1.524 m)   Wt 94 lb (42.6 kg)   BMI 18.36 kg/m  Wt Readings from Last 3 Encounters:  07/17/21 94 lb (42.6 kg)  04/19/21 94 lb (42.6 kg)  01/15/21 98 lb 3.2 oz (44.5 kg)     Health Maintenance Due  Topic Date Due   OPHTHALMOLOGY EXAM  Never done   COVID-19 Vaccine (4 - Booster for Moderna series) 03/14/2021   INFLUENZA VACCINE  06/17/2021    There are no preventive care reminders to display for this patient.  No results found for: TSH Lab Results  Component Value Date   WBC 4.3 04/19/2021   HGB 10.6 (L) 04/19/2021   HCT 33.7 (L) 04/19/2021   MCV 87 04/19/2021   PLT 271 04/19/2021   Lab Results  Component Value Date   NA 137 04/19/2021   K 4.3 04/19/2021   CO2 26 04/19/2021   GLUCOSE 296 (H) 04/19/2021   BUN 18 04/19/2021   CREATININE 1.15 (H) 04/19/2021   BILITOT 0.3 04/19/2021   ALKPHOS 40 (L) 04/19/2021   AST 18 04/19/2021   ALT 9 04/19/2021   PROT 7.2 04/19/2021   ALBUMIN 4.5 04/19/2021   CALCIUM 9.9 04/19/2021   ANIONGAP 4 (L) 08/13/2015   EGFR 47 (L) 04/19/2021   GFR 74.22 03/21/2013   Lab Results  Component Value Date   CHOL 162 04/19/2021   Lab  Results  Component Value Date   HDL 58 04/19/2021   Lab Results  Component Value Date   LDLCALC 84 04/19/2021   Lab Results  Component Value Date   TRIG 115 04/19/2021   Lab Results  Component Value Date   CHOLHDL 2.8 04/19/2021   Lab Results  Component Value Date   HGBA1C 7.4 (H) 04/19/2021      Assessment & Plan:   Problem List Items Addressed This Visit       Cardiovascular and Mediastinum   Essential hypertension - Primary    Symptoms well controlled on current medication.  Blood pressure slightly elevated in clinic today because patient had not taken medication before visit.  Dr. Stanford Breed is following patient and monitoring blood pressure as well.  Continue current dose.  Refill sent to pharmacy.  Follow-up with worsening unresolved symptoms.      Relevant Orders   CBC with Differential   Comprehensive metabolic panel   Lipid Panel     Endocrine   Diabetes mellitus type 2 in nonobese (HCC)    Symptoms well controlled on metformin 500 mg tablet by mouth daily.  Completed CBC, CMP, lipid panel today results pending.  No changes to current medication dose.  Continue diabetic diet.  And continue to monitor blood glucose at home.  Blood sugar runs in the 120-130 before and after meals.  Education provided to patient printed handouts given.      Hyperlipidemia associated with type 2 diabetes mellitus ([place])    Lipid panels completed results pending.  Continue low-cholesterol diet.       No orders of the defined types were placed in this encounter.   Follow-up: Return in about 3 months (around 10/16/2021).    Ivy Lynn, NP

## 2021-07-17 NOTE — Assessment & Plan Note (Signed)
Lipid panels completed results pending.  Continue low-cholesterol diet.

## 2021-07-17 NOTE — Assessment & Plan Note (Signed)
Symptoms well controlled on current medication.  Blood pressure slightly elevated in clinic today because patient had not taken medication before visit.  Dr. Jens Som is following patient and monitoring blood pressure as well.  Continue current dose.  Refill sent to pharmacy.  Follow-up with worsening unresolved symptoms.

## 2021-07-17 NOTE — Patient Instructions (Signed)
Diabetes Mellitus Basics Diabetes mellitus, or diabetes, is a long-term (chronic) disease. It occurs when the body does not properly use sugar (glucose) that is released from food after you eat. Diabetes mellitus may be caused by one or both of these problems: Your pancreas does not make enough of a hormone called insulin. Your body does not react in a normal way to the insulin that it makes. Insulin lets glucose enter cells in your body. This gives you energy. If you have diabetes, glucose cannot get into cells. This causes high blood glucose (hyperglycemia). How to treat and manage diabetes You may need to take insulin or other diabetes medicines daily to keep your glucose in balance. If you are prescribed insulin, you will learn how to give yourself insulin by injection. You may need to adjust the amount of insulin you take based on the foods that you eat. You will need to check your blood glucose levels using a glucose monitor as told by your health care provider. The readings can help determine if you have low or high blood glucose. Generally, you should have these blood glucose levels: Before meals (preprandial): 80-130 mg/dL (4.4-7.2 mmol/L). After meals (postprandial): below 180 mg/dL (10 mmol/L). Hemoglobin A1c (HbA1c) level: less than 7%. Your health care provider will set treatment goals for you. Keep all follow-up visits. This is important. Follow these instructions at home: Diabetes medicines Take your diabetes medicines every day as told by your health care provider. List your diabetes medicines here: Name of medicine: ______________________________ Amount (dose): _______________ Time (a.m./p.m.): _______________ Notes: ___________________________________ Name of medicine: ______________________________ Amount (dose): _______________ Time (a.m./p.m.): _______________ Notes: ___________________________________ Name of medicine: ______________________________ Amount (dose):  _______________ Time (a.m./p.m.): _______________ Notes: ___________________________________ Insulin If you use insulin, list the types of insulin you use here: Insulin type: ______________________________ Amount (dose): _______________ Time (a.m./p.m.): _______________Notes: ___________________________________ Insulin type: ______________________________ Amount (dose): _______________ Time (a.m./p.m.): _______________ Notes: ___________________________________ Insulin type: ______________________________ Amount (dose): _______________ Time (a.m./p.m.): _______________ Notes: ___________________________________ Insulin type: ______________________________ Amount (dose): _______________ Time (a.m./p.m.): _______________ Notes: ___________________________________ Insulin type: ______________________________ Amount (dose): _______________ Time (a.m./p.m.): _______________ Notes: ___________________________________ Managing blood glucose Check your blood glucose levels using a glucose monitor as told by your health care provider. Write down the times that you check your glucose levels here: Time: _______________ Notes: ___________________________________ Time: _______________ Notes: ___________________________________ Time: _______________ Notes: ___________________________________ Time: _______________ Notes: ___________________________________ Time: _______________ Notes: ___________________________________ Time: _______________ Notes: ___________________________________  Low blood glucose Low blood glucose (hypoglycemia) is when glucose is at or below 70 mg/dL (3.9 mmol/L). Symptoms may include: Feeling: Hungry. Sweaty and clammy. Irritable or easily upset. Dizzy. Sleepy. Having: A fast heartbeat. A headache. A change in your vision. Numbness around the mouth, lips, or tongue. Having trouble with: Moving (coordination). Sleeping. Treating low blood glucose To treat low blood  glucose, eat or drink something containing sugar right away. If you can think clearly and swallow safely, follow the 15:15 rule: Take 15 grams of a fast-acting carb (carbohydrate), as told by your health care provider. Some fast-acting carbs are: Glucose tablets: take 3-4 tablets. Hard candy: eat 3-5 pieces. Fruit juice: drink 4 oz (120 mL). Regular (not diet) soda: drink 4-6 oz (120-180 mL). Honey or sugar: eat 1 Tbsp (15 mL). Check your blood glucose levels 15 minutes after you take the carb. If your glucose is still at or below 70 mg/dL (3.9 mmol/L), take 15 grams of a carb again. If your glucose does not go above 70 mg/dL (3.9 mmol/L) after 3 tries,   get help right away. After your glucose goes back to normal, eat a meal or a snack within 1 hour. Treating very low blood glucose If your glucose is at or below 54 mg/dL (3 mmol/L), you have very low blood glucose (severe hypoglycemia). This is an emergency. Do not wait to see if the symptoms will go away. Get medical help right away. Call your local emergency services (911 in the [place] Do not drive yourself to the hospital. Questions to ask your health care provider Should I talk with a diabetes educator? What equipment will I need to care for myself at home? What diabetes medicines do I need? When should I take them? How often do I need to check my blood glucose levels? What number can I call if I have questions? When is my follow-up visit? Where can I find a support group for people with diabetes? Where to find more information American Diabetes Association: www.diabetes.org Association of Diabetes Care and Education Specialists: www.diabeteseducator.org Contact a health care provider if: Your blood glucose is at or above 240 mg/dL (13.3 mmol/L) for 2 days in a row. You have been sick or have had a fever for 2 days or more, and you are not getting better. You have any of these problems for more than 6 hours: You cannot eat or  drink. You feel nauseous. You vomit. You have diarrhea. Get help right away if: Your blood glucose is lower than 54 mg/dL (3 mmol/L). You get confused. You have trouble thinking clearly. You have trouble breathing. These symptoms may represent a serious problem that is an emergency. Do not wait to see if the symptoms will go away. Get medical help right away. Call your local emergency services (911 in the [place] Do not drive yourself to the hospital. Summary Diabetes mellitus is a chronic disease that occurs when the body does not properly use sugar (glucose) that is released from food after you eat. Take insulin and diabetes medicines as told. Check your blood glucose every day, as often as told. Keep all follow-up visits. This is important. This information is not intended to replace advice given to you by your health care provider. Make sure you discuss any questions you have with your health care provider. Document Revised: 03/06/2020 Document Reviewed: 03/06/2020 Elsevier Patient Education  2022 [employer] Inc. Hypertension, Adult Hypertension is another name for high blood pressure. High blood pressure forces your heart to work harder to pump blood. This can cause problems over time. There are two numbers in a blood pressure reading. There is a top number (systolic) over a bottom number (diastolic). It is best to have a blood pressure that is below 120/80. Healthy choices can help lower your blood pressure, or you may need medicine to help lower it. What are the causes? The cause of this condition is not known. Some conditions may be related to high blood pressure. What increases the risk? Smoking. Having type 2 diabetes mellitus, high cholesterol, or both. Not getting enough exercise or physical activity. Being overweight. Having too much fat, sugar, calories, or salt (sodium) in your diet. Drinking too much alcohol. Having long-term (chronic) kidney disease. Having a family  history of high blood pressure. Age. Risk increases with age. Race. You may be at higher risk if you are African American. Gender. Men are at higher risk than women before age 45. After age 65, women are at higher risk than men. Having obstructive sleep apnea. Stress. What are the signs or symptoms? High   blood pressure may not cause symptoms. Very high blood pressure (hypertensive crisis) may cause: Headache. Feelings of worry or nervousness (anxiety). Shortness of breath. Nosebleed. A feeling of being sick to your stomach (nausea). Throwing up (vomiting). Changes in how you see. Very bad chest pain. Seizures. How is this treated? This condition is treated by making healthy lifestyle changes, such as: Eating healthy foods. Exercising more. Drinking less alcohol. Your health care provider may prescribe medicine if lifestyle changes are not enough to get your blood pressure under control, and if: Your top number is above 130. Your bottom number is above 80. Your personal target blood pressure may vary. Follow these instructions at home: Eating and drinking  If told, follow the DASH eating plan. To follow this plan: Fill one half of your plate at each meal with fruits and vegetables. Fill one fourth of your plate at each meal with whole grains. Whole grains include whole-wheat pasta, brown rice, and whole-grain bread. Eat or drink low-fat dairy products, such as skim milk or low-fat yogurt. Fill one fourth of your plate at each meal with low-fat (lean) proteins. Low-fat proteins include fish, chicken without skin, eggs, beans, and tofu. Avoid fatty meat, cured and processed meat, or chicken with skin. Avoid pre-made or processed food. Eat less than 1,500 mg of salt each day. Do not drink alcohol if: Your doctor tells you not to drink. You are pregnant, may be pregnant, or are planning to become pregnant. If you drink alcohol: Limit how much you use to: 0-1 drink a day for  women. 0-2 drinks a day for men. Be aware of how much alcohol is in your drink. In the [place] one drink equals one 12 oz bottle of beer (355 mL), one 5 oz glass of wine (148 mL), or one 1 oz glass of hard liquor (44 mL). Lifestyle  Work with your doctor to stay at a healthy weight or to lose weight. Ask your doctor what the best weight is for you. Get at least 30 minutes of exercise most days of the week. This may include walking, swimming, or biking. Get at least 30 minutes of exercise that strengthens your muscles (resistance exercise) at least 3 days a week. This may include lifting weights or doing Pilates. Do not use any products that contain nicotine or tobacco, such as cigarettes, e-cigarettes, and chewing tobacco. If you need help quitting, ask your doctor. Check your blood pressure at home as told by your doctor. Keep all follow-up visits as told by your doctor. This is important. Medicines Take over-the-counter and prescription medicines only as told by your doctor. Follow directions carefully. Do not skip doses of blood pressure medicine. The medicine does not work as well if you skip doses. Skipping doses also puts you at risk for problems. Ask your doctor about side effects or reactions to medicines that you should watch for. Contact a doctor if you: Think you are having a reaction to the medicine you are taking. Have headaches that keep coming back (recurring). Feel dizzy. Have swelling in your ankles. Have trouble with your vision. Get help right away if you: Get a very bad headache. Start to feel mixed up (confused). Feel weak or numb. Feel faint. Have very bad pain in your: Chest. Belly (abdomen). Throw up more than once. Have trouble breathing. Summary Hypertension is another name for high blood pressure. High blood pressure forces your heart to work harder to pump blood. For most people, a normal blood pressure is   less than 120/80. Making healthy choices can help  lower blood pressure. If your blood pressure does not get lower with healthy choices, you may need to take medicine. This information is not intended to replace advice given to you by your health care provider. Make sure you discuss any questions you have with your health care provider. Document Revised: 07/14/2018 Document Reviewed: 07/14/2018 Elsevier Patient Education  2022 [employer] Inc.  

## 2021-07-18 ENCOUNTER — Ambulatory Visit: Payer: Medicare HMO | Admitting: Nurse Practitioner

## 2021-07-18 LAB — LIPID PANEL
Chol/HDL Ratio: 2.6 ratio (ref 0.0–4.4)
Cholesterol, Total: 144 mg/dL (ref 100–199)
HDL: 56 mg/dL (ref >39)
LDL Chol Calc (NIH): 67 mg/dL (ref 0–99)
Triglycerides: 117 mg/dL (ref 0–149)
VLDL Cholesterol Cal: 21 mg/dL (ref 5–40)

## 2021-07-18 LAB — COMPREHENSIVE METABOLIC PANEL
ALT: 10 IU/L (ref 0–32)
AST: 18 IU/L (ref 0–40)
Albumin/Globulin Ratio: 1.7 (ref 1.2–2.2)
Albumin: 4.3 g/dL (ref 3.6–4.6)
Alkaline Phosphatase: 37 IU/L — ABNORMAL LOW (ref 44–121)
BUN/Creatinine Ratio: 15 (ref 12–28)
BUN: 15 mg/dL (ref 8–27)
Bilirubin Total: 0.4 mg/dL (ref 0.0–1.2)
CO2: 24 mmol/L (ref 20–29)
Calcium: 9.8 mg/dL (ref 8.7–10.3)
Chloride: 104 mmol/L (ref 96–106)
Creatinine, Ser: 1.03 mg/dL — ABNORMAL HIGH (ref 0.57–1.00)
Globulin, Total: 2.5 g/dL (ref 1.5–4.5)
Glucose: 114 mg/dL — ABNORMAL HIGH (ref 65–99)
Potassium: 4.4 mmol/L (ref 3.5–5.2)
Sodium: 141 mmol/L (ref 134–144)
Total Protein: 6.8 g/dL (ref 6.0–8.5)
eGFR: 53 mL/min/{1.73_m2} — ABNORMAL LOW (ref >59)

## 2021-07-18 LAB — CBC WITH DIFFERENTIAL/PLATELET
Basophils Absolute: 0 10*3/uL (ref 0.0–0.2)
Basos: 1 %
EOS (ABSOLUTE): 0.3 10*3/uL (ref 0.0–0.4)
Eos: 6 %
Hematocrit: 31.2 % — ABNORMAL LOW (ref 34.0–46.6)
Hemoglobin: 9.5 g/dL — ABNORMAL LOW (ref 11.1–15.9)
Immature Grans (Abs): 0 10*3/uL (ref 0.0–0.1)
Immature Granulocytes: 0 %
Lymphocytes Absolute: 1.6 10*3/uL (ref 0.7–3.1)
Lymphs: 30 %
MCH: 27 pg (ref 26.6–33.0)
MCHC: 30.4 g/dL — ABNORMAL LOW (ref 31.5–35.7)
MCV: 89 fL (ref 79–97)
Monocytes Absolute: 0.4 10*3/uL (ref 0.1–0.9)
Monocytes: 8 %
Neutrophils Absolute: 2.9 10*3/uL (ref 1.4–7.0)
Neutrophils: 55 %
Platelets: 289 10*3/uL (ref 150–450)
RBC: 3.52 x10E6/uL — ABNORMAL LOW (ref 3.77–5.28)
RDW: 17.2 % — ABNORMAL HIGH (ref 11.7–15.4)
WBC: 5.3 10*3/uL (ref 3.4–10.8)

## 2021-07-19 ENCOUNTER — Ambulatory Visit (INDEPENDENT_AMBULATORY_CARE_PROVIDER_SITE_OTHER): Payer: Medicare HMO

## 2021-07-19 VITALS — Ht 60.0 in | Wt 94.0 lb

## 2021-07-19 DIAGNOSIS — Z0000 Encounter for general adult medical examination without abnormal findings: Secondary | ICD-10-CM | POA: Diagnosis not present

## 2021-07-19 NOTE — Progress Notes (Signed)
Subjective:   Kelly Castillo is a 87 y.o. female who presents for an Initial Medicare Annual Wellness Visit. Virtual Visit via Telephone Note  I connected with  Kelly Castillo on 07/19/21 at  1:15 PM EDT by telephone and verified that I am speaking with the correct person using two identifiers.  Location: Patient: Home Provider: [facility] Persons participating in the virtual visit: patient/Nurse Health Advisor   I discussed the limitations, risks, security and privacy concerns of performing an evaluation and management service by telephone and the availability of in person appointments. The patient expressed understanding and agreed to proceed.  Interactive audio and video telecommunications were attempted between this nurse and patient, however failed, due to patient having technical difficulties OR patient did not have access to video capability.  We continued and completed visit with audio only.  Some vital signs may be absent or patient reported.   Chriss Driver, LPN  Review of Systems     Cardiac Risk Factors include: advanced age (>63mn, >>38women);diabetes mellitus;dyslipidemia;hypertension;sedentary lifestyle     Objective:    Today's Vitals   07/19/21 1313  Weight: 94 lb (42.6 kg)  Height: 5' (1.524 m)   Body mass index is 18.36 kg/m.  Advanced Directives 07/19/2021 08/08/2015 07/18/2011  Does Patient Have a Medical Advance Directive? Yes Yes Patient has advance directive, copy not in chart  Type of Advance Directive - Kelly Castillo will Kelly Castillo Does patient want to make changes to medical advance directive? - No - Patient declined -  [place] of [place]? - No - copy requested Copy requested from family  Would patient like information on creating a medical advance directive? - No - patient declined information -  Pre-existing out of facility DNR order (yellow form or pink MOST form) - - No     Current Medications (verified) Outpatient Encounter Medications as of 07/19/2021  Medication Sig   acetaminophen (TYLENOL) 325 MG tablet Take 650 mg by mouth as needed.   aspirin 81 MG tablet Take 1 tablet (81 mg total) by mouth daily.   blood glucose meter kit and supplies May test up to 4 times a day. E11.9   Calcium Carbonate-Vitamin D (CALCIUM-VITAMIN D) 600-125 MG-UNIT TABS Take 1 tablet by mouth 2 (two) times daily.   glimepiride (AMARYL) 4 MG tablet TAKE 1 TABLET DAILY   lisinopril (ZESTRIL) 40 MG tablet Take 40 mg by mouth daily.   metFORMIN (GLUCOPHAGE) 500 MG tablet TAKE 1 TABLET DAILY   metoprolol tartrate (LOPRESSOR) 25 MG tablet Take 1 tablet (25 mg total) by mouth 2 (two) times daily. Call office to make appointment for further refills.   NITROSTAT 0.4 MG SL tablet Take 0.4 mg by mouth every 5 (five) minutes as needed for chest pain.    rosuvastatin (CRESTOR) 20 MG tablet Take 1 tablet (20 mg total) by mouth daily.   No facility-administered encounter medications on file as of 07/19/2021.    Allergies (verified) Penicillins   History: Past Medical History:  Diagnosis Date   Anemia    GERD (gastroesophageal reflux disease)    HTN (hypertension)    Hypercholesterolemia    LBBB (left bundle branch block)    Lumbar disc disease    Non-ischemic cardiomyopathy (HCC)    Poor short term memory    Type II diabetes mellitus (Kelly Castillo    Past Surgical History:  Procedure Laterality Date   APPEN[place]CTOMY  1970's  [place]   CARDIAC CATHETERIZATION N/A 08/08/2015   Procedure: Left Heart Cath and Coronary Angiography;  Surgeon: Belva Crome, MD;  Location: [place] [facility];  Service: Cardiovascular;  Laterality: N/A;   CARDIAC CATHETERIZATION  01/31/2014   CATARACT EXTRACTION W/ INTRAOCULAR LENS IMPLANT Right    CATARACT EXTRACTION W/PHACO  07/24/2011   Procedure: CATARACT EXTRACTION PHACO AND INTRAOCULAR LENS PLACEMENT (IOC);  Surgeon: Tonny Branch;  Location: [facility];   Service: Ophthalmology;  Laterality: Left;  CDE: 14.73   CORONARY ARTERY BYPASS GRAFT N/A 08/10/2015   Procedure: OFF PUMP CORONARY ARTERY BYPASS GRAFTING (CABG) UTILIZING THE LEFT INTERNAL MAMMARY ARTERY;  Surgeon: Melrose Nakayama, MD;  Location: [facility];  Service: Open Heart Surgery;  Laterality: N/A;   DILATION AND CURETTAGE OF UTERUS     EYE SURGERY Bilateral    "laser; related to diabetes"   FRACTURE SURGERY     LEFT HEART CATHETERIZATION WITH CORONARY ANGIOGRAM N/A 01/31/2014   Procedure: LEFT HEART CATHETERIZATION WITH CORONARY ANGIOGRAM;  Surgeon: Kelly M Martinique, MD;  Location: [facility];  Service: Cardiovascular;  Laterality: N/A;   ORIF ANKLE FRACTURE Right 1980's   TEE WITHOUT CARDIOVERSION N/A 08/10/2015   Procedure: TRANSESOPHAGEAL ECHOCARDIOGRAM (TEE);  Surgeon: Melrose Nakayama, MD;  Location: [place];  Service: Open Heart Surgery;  Laterality: N/A;   TUBAL LIGATION     VAGINAL HYSTERECTOMY  1980's   Family History  Problem Relation Age of Onset   Anesthesia problems Neg Hx    Hypotension Neg Hx    Malignant hyperthermia Neg Hx    Pseudochol deficiency Neg Hx    Social History   Socioeconomic History   Marital status: Widowed    Spouse name: Not on file   Number of children: 10   Years of education: Not on file   Highest education level: Not on file  Occupational History   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  Tobacco Use   Smoking status: Never   Smokeless tobacco: Never  Substance and Sexual Activity   Alcohol use: No    Alcohol/week: 0.0 standard drinks   Drug use: No   Sexual activity: Yes    Birth control/protection: Surgical  Other Topics Concern   Not on file  Social History Narrative   9 children living and 1 deceased.   Social Determinants of Health   Financial Resource Strain: Low Risk    Difficulty of Paying Living Expenses: Not hard at all  Food Insecurity: No Food Insecurity   Worried About  Charity fundraiser in the Last Year: Never true   [place] in the Last Year: Never true  Transportation Needs: No Transportation Needs   Lack of Transportation (Medical): No   Lack of Transportation (Non-Medical): No  Physical Activity: Insufficiently Active   Days of Exercise per Week: 5 days   Minutes of Exercise per Session: 20 min  Stress: No Stress Concern Present   Feeling of Stress : Not at all  Social Connections: Socially Isolated   Frequency of Communication with Friends and Family: More than three times a week   Frequency of Social Gatherings with Friends and Family: More than three times a week   Attends Religious Services: Never   Marine scientist or Organizations: No   Attends Archivist Meetings: Never   Marital Status: Widowed    Tobacco Counseling Counseling given: Not Answered   Clinical Intake:  Pre-visit preparation completed:  Yes  Pain : No/denies pain     BMI - recorded: 18.36 Nutritional Status: BMI <19  Underweight Nutritional Risks: None Diabetes: Yes  How often do you need to have someone help you when you read instructions, pamphlets, or other written materials from your doctor or pharmacy?: 1 - Never  Diabetic? Nutrition Risk Assessment:  Has the patient had any N/V/D within the last 2 months?  No  Does the patient have any non-healing wounds?  No  Has the patient had any unintentional weight loss or weight gain?  No   Diabetes:  Is the patient diabetic?  Yes  If diabetic, was a CBG obtained today?  Yes . Pt's daughter reports fasting blood sugar of 85 this morning.  Did the patient bring in their glucometer from home?  No  Phone visit. How often do you monitor your CBG's? BID.   Financial Strains and Diabetes Management:  Are you having any financial strains with the device, your supplies or your medication? No .  Does the patient want to be seen by Chronic Care Management for management of their diabetes?  No   Would the patient like to be referred to a Nutritionist or for Diabetic Management?  No   Diabetic Exams:  Diabetic Eye Exam: Completed YES. 08/2021.  Diabetic Foot Exam: Completed 11/06/20. Pt has been advised about the importance in completing this exam. Pt is scheduled for diabetic foot exam on 10/2022.    Interpreter Needed?: No  Information entered by :: Randal Buba, LPN   Activities of Daily Living In your present state of health, do you have any difficulty performing the following activities: 07/19/2021  Vision? N  Difficulty concentrating or making decisions? N  Walking or climbing stairs? Y  Comment Uses a walker and cane.  Dressing or bathing? Y  Comment Daughter assists.  Doing errands, shopping? N  Preparing Food and eating ? N  Using the Toilet? N  In the past six months, have you accidently leaked urine? Y  Comment Does leak urine at times.  Do you have problems with loss of bowel control? N  Managing your Medications? N  Managing your Finances? N  Housekeeping or managing your Housekeeping? N  Some recent data might be hidden    Patient Care Team: Ivy Lynn, NP as PCP - General (Nurse Practitioner) Lelon Perla, MD as Consulting Physician (Cardiology)  Indicate any recent Medical Services you may have received from other than [facility] providers in the past year (date may be approximate).     Assessment:   This is a routine wellness examination for [facility].  Hearing/Vision screen Vision Screening - Comments:: UP to date with eye exam. Glasses. Uses [facility]  Dietary issues and exercise activities discussed: Current Exercise Habits: Home exercise routine, Type of exercise: walking, Time (Minutes): 10, Frequency (Times/Week): 5, Weekly Exercise (Minutes/Week): 50, Intensity: Mild, Exercise limited by: cardiac condition(s);orthopedic condition(s)   Goals Addressed             This Visit's Progress    Exercise 3x per week (30 min per  time)       Pt wants to try exercising more.       Depression Screen PHQ 2/9 Scores 07/19/2021 07/17/2021 04/19/2021 01/15/2021 12/14/2020  PHQ - 2 Score 0 0 0 0 0  PHQ- 9 Score - - 0 - -    Fall Risk Fall Risk  07/19/2021 07/17/2021 04/19/2021 01/15/2021 12/14/2020  Falls in the past year? 0 0  0 0 0  Number falls in past yr: 0 - - - -  Injury with Fall? 0 - - - -  Risk for fall due to : Impaired mobility - - - -  Follow up Falls prevention discussed - - - -    FALL RISK PREVENTION PERTAINING TO THE HOME:  Any stairs in or around the home? No  If so, are there any without handrails? No  Home free of loose throw rugs in walkways, pet beds, electrical cords, etc? Yes  Adequate lighting in your home to reduce risk of falls? Yes   ASSISTIVE DEVICES UTILIZED TO PREVENT FALLS:  Life alert? No  Use of a cane, walker or w/c? Yes  Grab bars in the bathroom? Yes  Shower chair or bench in shower? Yes  Elevated toilet seat or a handicapped toilet? No   TIMED UP AND GO:  Was the test performed? No . Phone visit.  Cognitive Function: Cognitive status assessed by direct observation. 07/19/2021 Patient has current  screening 6CIT with score of 12.     6CIT Screen 07/19/2021  What Year? 4 points  What month? 0 points  What time? 0 points  Count back from 20 0 points  Months in reverse 0 points  Repeat phrase 8 points  Total Score 12    Immunizations Immunization History  Administered Date(s) Administered   Moderna Sars-Covid-2 Vaccination 04/19/2020, 05/17/2020, 12/14/2020    TDAP status: Due, Education has been provided regarding the importance of this vaccine. Advised may receive this vaccine at local pharmacy or Health Dept. Aware to provide a copy of the vaccination record if obtained from local pharmacy or Health Dept. Verbalized acceptance and understanding.  Flu Vaccine status: Declined, Education has been provided regarding the importance of this vaccine but patient still declined.  Advised may receive this vaccine at local pharmacy or Health Dept. Aware to provide a copy of the vaccination record if obtained from local pharmacy or Health Dept. Verbalized acceptance and understanding.  Pneumococcal vaccine status: Declined,  Education has been provided regarding the importance of this vaccine but patient still declined. Advised may receive this vaccine at local pharmacy or Health Dept. Aware to provide a copy of the vaccination record if obtained from local pharmacy or Health Dept. Verbalized acceptance and understanding.   Covid-19 vaccine status: Completed vaccines  Qualifies for Shingles Vaccine? Yes   Zostavax completed No   Shingrix Completed?: No.    Education has been provided regarding the importance of this vaccine. Patient has been advised to call insurance company to determine out of pocket expense if they have not yet received this vaccine. Advised may also receive vaccine at local pharmacy or Health Dept. Verbalized acceptance and understanding.  Screening Tests Health Maintenance  Topic Date Due   OPHTHALMOLOGY EXAM  Never done   COVID-19 Vaccine (4 - Booster for Moderna series) 03/14/2021   INFLUENZA VACCINE  Never done   Zoster Vaccines- Shingrix (1 of 2) 10/16/2021 (Originally 12/27/1952)   TETANUS/TDAP  12/14/2021 (Originally 12/27/1952)   PNA vac Low Risk Adult (1 of 2 - PCV13) 12/14/2021 (Originally 12/27/2000)   HEMOGLOBIN A1C  10/19/2021   FOOT EXAM  11/06/2021   DEXA SCAN  01/16/2023   HPV VACCINES  Aged Out    Health Maintenance  Health Maintenance Due  Topic Date Due   OPHTHALMOLOGY EXAM  Never done   COVID-19 Vaccine (4 - Booster for Moderna series) 03/14/2021   INFLUENZA VACCINE  Never done  Colorectal cancer screening: No longer required.   Mammogram status: No longer required due to age.  Bone Density status: Completed 01/15/21. Results reflect: Bone density results: OSTEOPOROSIS. Repeat every 2 years.  Lung Cancer Screening:  (Low Dose CT Chest recommended if Age 21-80 years, 30 pack-year currently smoking OR have quit w/in 15years.) does not qualify.     Additional Screening:  Hepatitis C Screening: does not qualify.  Vision Screening: Recommended annual ophthalmology exams for early detection of glaucoma and other disorders of the eye. Is the patient up to date with their annual eye exam?  Yes  Who is the provider or what is the name of the office in which the patient attends annual eye exams? [facility] If pt is not established with a provider, would they like to be referred to a provider to establish care? No .   Dental Screening: Recommended annual dental exams for proper oral hygiene  Community Resource Referral / Chronic Care Management: CRR required this visit?  No   CCM required this visit?  No      Plan:     I have personally reviewed and noted the following in the patient's chart:   Medical and social history Use of alcohol, tobacco or illicit drugs  Current medications and supplements including opioid prescriptions. Patient is not currently taking opioid prescriptions. Functional ability and status Nutritional status Physical activity Advanced directives List of other physicians Hospitalizations, surgeries, and ER visits in previous 12 months Vitals Screenings to include cognitive, depression, and falls Referrals and appointments  In addition, I have reviewed and discussed with patient certain preventive protocols, quality metrics, and best practice recommendations. A written personalized care plan for preventive services as well as general preventive health recommendations were provided to patient.     Chriss Driver, LPN   02/20/9805   Nurse Notes: 6CIT performed today. Pt with score of 12. Pt currently lives with son and has family assisting her with ADLs, medications and finances.

## 2021-07-19 NOTE — Patient Instructions (Signed)
Kelly Castillo , Thank you for taking time to come for your Medicare Wellness Visit. I appreciate your ongoing commitment to your health goals. Please review the following plan we discussed and let me know if I can assist you in the future.   Screening recommendations/referrals: Colonoscopy: No longer required Mammogram: No longer required Bone Density: 01/15/2021, Osteoporosis Recommended yearly ophthalmology/optometry visit for glaucoma screening and checkup Recommended yearly dental visit for hygiene and checkup  Vaccinations: Influenza vaccine: Due in fall Pneumococcal vaccine: Declines Tdap vaccine: Declines Shingles vaccine: Shingrix discussed. Please contact your pharmacy for coverage information.     Covid-19:12/14/20 05/17/20 04/19/20  Advanced directives: Please bring a copy of your health care power of attorney and living will to the office to be added to your chart at your convenience.   Conditions/risks identified: Aim for 30 minutes of exercise per day, drink 6-8 glasses of water and eat lots of fruits and vegetables.   Next appointment: Follow up in one year for your annual wellness visit    Preventive Care 85 Years and Older, Female Preventive care refers to lifestyle choices and visits with your health care provider that can promote health and wellness. What does preventive care include? A yearly physical exam. This is also called an annual well check. Dental exams once or twice a year. Routine eye exams. Ask your health care provider how often you should have your eyes checked. Personal lifestyle choices, including: Daily care of your teeth and gums. Regular physical activity. Eating a healthy diet. Avoiding tobacco and drug use. Limiting alcohol use. Practicing safe sex. Taking low-dose aspirin every day. Taking vitamin and mineral supplements as recommended by your health care provider. What happens during an annual well check? The services and screenings done by  your health care provider during your annual well check will depend on your age, overall health, lifestyle risk factors, and family history of disease. Counseling  Your health care provider may ask you questions about your: Alcohol use. Tobacco use. Drug use. Emotional well-being. Home and relationship well-being. Sexual activity. Eating habits. History of falls. Memory and ability to understand (cognition). Work and work Astronomer. Reproductive health. Screening  You may have the following tests or measurements: Height, weight, and BMI. Blood pressure. Lipid and cholesterol levels. These may be checked every 5 years, or more frequently if you are over 32 years old. Skin check. Lung cancer screening. You may have this screening every year starting at age 46 if you have a 30-pack-year history of smoking and currently smoke or have quit within the past 15 years. Fecal occult blood test (FOBT) of the stool. You may have this test every year starting at age 29. Flexible sigmoidoscopy or colonoscopy. You may have a sigmoidoscopy every 5 years or a colonoscopy every 10 years starting at age 70. Hepatitis C blood test. Hepatitis B blood test. Sexually transmitted disease (STD) testing. Diabetes screening. This is done by checking your blood sugar (glucose) after you have not eaten for a while (fasting). You may have this done every 1-3 years. Bone density scan. This is done to screen for osteoporosis. You may have this done starting at age 58. Mammogram. This may be done every 1-2 years. Talk to your health care provider about how often you should have regular mammograms. Talk with your health care provider about your test results, treatment options, and if necessary, the need for more tests. Vaccines  Your health care provider may recommend certain vaccines, such as: Influenza vaccine. This  is recommended every year. Tetanus, diphtheria, and acellular pertussis (Tdap, Td) vaccine. You  may need a Td booster every 10 years. Zoster vaccine. You may need this after age 33. Pneumococcal 13-valent conjugate (PCV13) vaccine. One dose is recommended after age 32. Pneumococcal polysaccharide (PPSV23) vaccine. One dose is recommended after age 63. Talk to your health care provider about which screenings and vaccines you need and how often you need them. This information is not intended to replace advice given to you by your health care provider. Make sure you discuss any questions you have with your health care provider. Document Released: 11/30/2015 Document Revised: 07/23/2016 Document Reviewed: 09/04/2015 Elsevier Interactive Patient Education  2017 [employer] Prevention in the Home Falls can cause injuries. They can happen to people of all ages. There are many things you can do to make your home safe and to help prevent falls. What can I do on the outside of my home? Regularly fix the edges of walkways and driveways and fix any cracks. Remove anything that might make you trip as you walk through a door, such as a raised step or threshold. Trim any bushes or trees on the path to your home. Use bright outdoor lighting. Clear any walking paths of anything that might make someone trip, such as rocks or tools. Regularly check to see if handrails are loose or broken. Make sure that both sides of any steps have handrails. Any raised decks and porches should have guardrails on the edges. Have any leaves, snow, or ice cleared regularly. Use sand or salt on walking paths during winter. Clean up any spills in your garage right away. This includes oil or grease spills. What can I do in the bathroom? Use night lights. Install grab bars by the toilet and in the tub and shower. Do not use towel bars as grab bars. Use non-skid mats or decals in the tub or shower. If you need to sit down in the shower, use a plastic, non-slip stool. Keep the floor dry. Clean up any water that spills  on the floor as soon as it happens. Remove soap buildup in the tub or shower regularly. Attach bath mats securely with double-sided non-slip rug tape. Do not have throw rugs and other things on the floor that can make you trip. What can I do in the bedroom? Use night lights. Make sure that you have a light by your bed that is easy to reach. Do not use any sheets or blankets that are too big for your bed. They should not hang down onto the floor. Have a firm chair that has side arms. You can use this for support while you get dressed. Do not have throw rugs and other things on the floor that can make you trip. What can I do in the kitchen? Clean up any spills right away. Avoid walking on wet floors. Keep items that you use a lot in easy-to-reach places. If you need to reach something above you, use a strong step stool that has a grab bar. Keep electrical cords out of the way. Do not use floor polish or wax that makes floors slippery. If you must use wax, use non-skid floor wax. Do not have throw rugs and other things on the floor that can make you trip. What can I do with my stairs? Do not leave any items on the stairs. Make sure that there are handrails on both sides of the stairs and use them. Fix handrails that  are broken or loose. Make sure that handrails are as long as the stairways. Check any carpeting to make sure that it is firmly attached to the stairs. Fix any carpet that is loose or worn. Avoid having throw rugs at the top or bottom of the stairs. If you do have throw rugs, attach them to the floor with carpet tape. Make sure that you have a light switch at the top of the stairs and the bottom of the stairs. If you do not have them, ask someone to add them for you. What else can I do to help prevent falls? Wear shoes that: Do not have high heels. Have rubber bottoms. Are comfortable and fit you well. Are closed at the toe. Do not wear sandals. If you use a stepladder: Make  sure that it is fully opened. Do not climb a closed stepladder. Make sure that both sides of the stepladder are locked into place. Ask someone to hold it for you, if possible. Clearly mark and make sure that you can see: Any grab bars or handrails. First and last steps. Where the edge of each step is. Use tools that help you move around (mobility aids) if they are needed. These include: Canes. Walkers. Scooters. Crutches. Turn on the lights when you go into a dark area. Replace any light bulbs as soon as they burn out. Set up your furniture so you have a clear path. Avoid moving your furniture around. If any of your floors are uneven, fix them. If there are any pets around you, be aware of where they are. Review your medicines with your doctor. Some medicines can make you feel dizzy. This can increase your chance of falling. Ask your doctor what other things that you can do to help prevent falls. This information is not intended to replace advice given to you by your health care provider. Make sure you discuss any questions you have with your health care provider. Document Released: 08/30/2009 Document Revised: 04/10/2016 Document Reviewed: 12/08/2014 Elsevier Interactive Patient Education  2017 [employer].

## 2021-07-22 ENCOUNTER — Other Ambulatory Visit: Payer: Self-pay | Admitting: Nurse Practitioner

## 2021-07-22 MED ORDER — IRON (FERROUS SULFATE) 325 (65 FE) MG PO TABS: 325.0000 mg | ORAL_TABLET | Freq: Every day | ORAL | 2 refills | Status: DC

## 2021-08-08 ENCOUNTER — Telehealth: Payer: Self-pay | Admitting: Nurse Practitioner

## 2021-08-08 MED ORDER — GLIMEPIRIDE 4 MG PO TABS: 4.0000 mg | ORAL_TABLET | Freq: Every day | ORAL | 0 refills | Status: DC

## 2021-08-08 NOTE — Telephone Encounter (Signed)
Pt use to take 2 tabs of Glimepirde 4 mg when her A1C was running better, can she go back to this & if so can a new Rx be sent to [facility], or will pt have to be seen again to get this changed. Her next appt is not scheduled until end of Nov.

## 2021-08-08 NOTE — Telephone Encounter (Signed)
  Prescription Request  08/08/2021  Is this a "Controlled Substance" medicine? no Have you seen your PCP in the last 2 weeks? no If YES, route message to pool  -  If NO, patient needs to be scheduled for appointment.  What is the name of the medication or equipment? Glimeperide  Have you contacted your pharmacy to request a refill? yes  Which pharmacy would you like this sent to? [facility]   Patient notified that their request is being sent to the clinical staff for review and that they should receive a response within 2 business days.    Je's pt.  Daughter is concerned about her dosage too, so please call her.

## 2021-08-10 ENCOUNTER — Other Ambulatory Visit: Payer: Self-pay | Admitting: Nurse Practitioner

## 2021-08-12 NOTE — Telephone Encounter (Signed)
Called patient lmtcb 

## 2021-08-12 NOTE — Telephone Encounter (Signed)
Directions changed to 2 tabs daily, per TC on 08/08/21

## 2021-08-23 NOTE — Telephone Encounter (Signed)
Unable to reach patient, a prescription was sent to pharmacy for Glimeperide 4 mg take 2 daily on 08/12/21.

## 2021-08-27 NOTE — Telephone Encounter (Signed)
Attempts to contact pt without return call in over 3 days, will close encounter. 

## 2021-09-18 ENCOUNTER — Other Ambulatory Visit: Payer: Self-pay | Admitting: Family Medicine

## 2021-09-19 ENCOUNTER — Other Ambulatory Visit: Payer: Self-pay | Admitting: Cardiology

## 2021-10-16 ENCOUNTER — Ambulatory Visit: Payer: Medicare HMO | Admitting: Nurse Practitioner

## 2021-10-23 ENCOUNTER — Ambulatory Visit (INDEPENDENT_AMBULATORY_CARE_PROVIDER_SITE_OTHER): Payer: Medicare HMO | Admitting: Nurse Practitioner

## 2021-10-23 ENCOUNTER — Encounter: Payer: Self-pay | Admitting: Nurse Practitioner

## 2021-10-23 VITALS — BP 150/75 | HR 92 | Temp 97.9°F | Ht 60.0 in | Wt 94.0 lb

## 2021-10-23 DIAGNOSIS — E119 Type 2 diabetes mellitus without complications: Secondary | ICD-10-CM | POA: Diagnosis not present

## 2021-10-23 DIAGNOSIS — I10 Essential (primary) hypertension: Secondary | ICD-10-CM | POA: Diagnosis not present

## 2021-10-23 DIAGNOSIS — E785 Hyperlipidemia, unspecified: Secondary | ICD-10-CM | POA: Diagnosis not present

## 2021-10-23 DIAGNOSIS — E1169 Type 2 diabetes mellitus with other specified complication: Secondary | ICD-10-CM | POA: Diagnosis not present

## 2021-10-23 LAB — CBC WITH DIFFERENTIAL/PLATELET
Basophils Absolute: 0 10*3/uL (ref 0.0–0.2)
Basos: 1 %
EOS (ABSOLUTE): 0.3 10*3/uL (ref 0.0–0.4)
Eos: 5 %
Hematocrit: 31.3 % — ABNORMAL LOW (ref 34.0–46.6)
Hemoglobin: 10.2 g/dL — ABNORMAL LOW (ref 11.1–15.9)
Immature Grans (Abs): 0 10*3/uL (ref 0.0–0.1)
Immature Granulocytes: 0 %
Lymphocytes Absolute: 1.6 10*3/uL (ref 0.7–3.1)
Lymphs: 32 %
MCH: 28.1 pg (ref 26.6–33.0)
MCHC: 32.6 g/dL (ref 31.5–35.7)
MCV: 86 fL (ref 79–97)
Monocytes Absolute: 0.5 10*3/uL (ref 0.1–0.9)
Monocytes: 10 %
Neutrophils Absolute: 2.6 10*3/uL (ref 1.4–7.0)
Neutrophils: 52 %
Platelets: 250 10*3/uL (ref 150–450)
RBC: 3.63 x10E6/uL — ABNORMAL LOW (ref 3.77–5.28)
RDW: 14.4 % (ref 11.7–15.4)
WBC: 5 10*3/uL (ref 3.4–10.8)

## 2021-10-23 LAB — COMPREHENSIVE METABOLIC PANEL
ALT: 9 IU/L (ref 0–32)
AST: 19 IU/L (ref 0–40)
Albumin/Globulin Ratio: 2.1 (ref 1.2–2.2)
Albumin: 4.5 g/dL (ref 3.6–4.6)
Alkaline Phosphatase: 35 IU/L — ABNORMAL LOW (ref 44–121)
BUN/Creatinine Ratio: 20 (ref 12–28)
BUN: 24 mg/dL (ref 8–27)
Bilirubin Total: 0.3 mg/dL (ref 0.0–1.2)
CO2: 27 mmol/L (ref 20–29)
Calcium: 10.2 mg/dL (ref 8.7–10.3)
Chloride: 101 mmol/L (ref 96–106)
Creatinine, Ser: 1.19 mg/dL — ABNORMAL HIGH (ref 0.57–1.00)
Globulin, Total: 2.1 g/dL (ref 1.5–4.5)
Glucose: 179 mg/dL — ABNORMAL HIGH (ref 70–99)
Potassium: 5 mmol/L (ref 3.5–5.2)
Sodium: 139 mmol/L (ref 134–144)
Total Protein: 6.6 g/dL (ref 6.0–8.5)
eGFR: 45 mL/min/{1.73_m2} — ABNORMAL LOW (ref >59)

## 2021-10-23 LAB — LIPID PANEL
Chol/HDL Ratio: 2.4 ratio (ref 0.0–4.4)
Cholesterol, Total: 141 mg/dL (ref 100–199)
HDL: 60 mg/dL (ref >39)
LDL Chol Calc (NIH): 67 mg/dL (ref 0–99)
Triglycerides: 70 mg/dL (ref 0–149)
VLDL Cholesterol Cal: 14 mg/dL (ref 5–40)

## 2021-10-23 MED ORDER — IRON (FERROUS SULFATE) 325 (65 FE) MG PO TABS: 325.0000 mg | ORAL_TABLET | Freq: Every day | ORAL | 5 refills | Status: DC

## 2021-10-23 MED ORDER — LISINOPRIL 40 MG PO TABS: 40.0000 mg | ORAL_TABLET | Freq: Every day | ORAL | 3 refills | Status: DC

## 2021-10-23 NOTE — Progress Notes (Signed)
Established Patient Office Visit  Subjective:  Patient ID: Kelly Castillo, female    DOB: 01-27-1934  Age: 87 y.o. MRN: [id number]  CC:  Chief Complaint  Patient presents with   Medical Management of Chronic Issues    3 mos    HPI Kelly Castillo presents for Pt presents for follow up of hypertension. Patient was diagnosed in .  12/13/2009 the patient is tolerating the medication well without side effects. Compliance with treatment has been good; including taking medication as directed , maintains a healthy diet and regular exercise regimen , and following up as directed.  Patient reports blood pressure is well controlled at home only elevated in clinic today because she had not taken her blood pressure medicine.   The patient presents with history of type II diabetes mellitus without complications. Patient was diagnosed in 04/19/2021. Compliance with treatment has been good; the patient takes medication as directed , maintains a diabetic diet and an exercise regimen , follows up as directed , and is keeping a glucose diary. Sugars runs 90-130. Patient specifically denies associated symptoms, including blurred vision, fatigue, polydipsia, polyphagia and polyuria . Patient denies hypoglycemia. In regard to preventative care, the patient performs foot self-exams daily and last ophthalmology exam was last year patient one scheduled.   Mixed hyperlipidemia  Pt presents with hyperlipidemia. Patient was diagnosed in 04/19/2021. Compliance with treatment has been good the patient is compliant with medications, maintains a low cholesterol diet , follows up as directed , and maintains an exercise regimen . The patient denies experiencing any hypercholesterolemia related symptoms.     Past Medical History:  Diagnosis Date   Anemia    GERD (gastroesophageal reflux disease)    HTN (hypertension)    Hypercholesterolemia    LBBB (left bundle branch block)    Lumbar disc disease    Non-ischemic  cardiomyopathy (HCC)    Poor short term memory    Type II diabetes mellitus [facility])     Past Surgical History:  Procedure Laterality Date   APPENDECTOMY  1970's   [place]   CARDIAC CATHETERIZATION N/A 08/08/2015   Procedure: Left Heart Cath and Coronary Angiography;  Surgeon: Belva Crome, MD;  Location: [place] [facility];  Service: Cardiovascular;  Laterality: N/A;   CARDIAC CATHETERIZATION  01/31/2014   CATARACT EXTRACTION W/ INTRAOCULAR LENS IMPLANT Right    CATARACT EXTRACTION W/PHACO  07/24/2011   Procedure: CATARACT EXTRACTION PHACO AND INTRAOCULAR LENS PLACEMENT (IOC);  Surgeon: Tonny Branch;  Location: [facility];  Service: Ophthalmology;  Laterality: Left;  CDE: 14.73   CORONARY ARTERY BYPASS GRAFT N/A 08/10/2015   Procedure: OFF PUMP CORONARY ARTERY BYPASS GRAFTING (CABG) UTILIZING THE LEFT INTERNAL MAMMARY ARTERY;  Surgeon: Melrose Nakayama, MD;  Location: [place];  Service: Open Heart Surgery;  Laterality: N/A;   DILATION AND CURETTAGE OF UTERUS     EYE SURGERY Bilateral    "laser; related to diabetes"   FRACTURE SURGERY     LEFT HEART CATHETERIZATION WITH CORONARY ANGIOGRAM N/A 01/31/2014   Procedure: LEFT HEART CATHETERIZATION WITH CORONARY ANGIOGRAM;  Surgeon: Peter M Martinique, MD;  Location: [facility] CATH LAB;  Service: Cardiovascular;  Laterality: N/A;   ORIF ANKLE FRACTURE Right 1980's   TEE WITHOUT CARDIOVERSION N/A 08/10/2015   Procedure: TRANSESOPHAGEAL ECHOCARDIOGRAM (TEE);  Surgeon: Melrose Nakayama, MD;  Location: [place];  Service: Open Heart Surgery;  Laterality: N/A;   TUBAL LIGATION     VAGINAL HYSTERECTOMY  1980's    Family  History  Problem Relation Age of Onset   Anesthesia problems Neg Hx    Hypotension Neg Hx    Malignant hyperthermia Neg Hx    Pseudochol deficiency Neg Hx     Social History   Socioeconomic History   Marital status: Widowed    Spouse name: Not on file   Number of children: 10   Years of education: Not on file   Highest education level:  Not on file  Occupational History   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  Tobacco Use   Smoking status: Never   Smokeless tobacco: Never  Substance and Sexual Activity   Alcohol use: No    Alcohol/week: 0.0 standard drinks   Drug use: No   Sexual activity: Yes    Birth control/protection: Surgical  Other Topics Concern   Not on file  Social History Narrative   9 children living and 1 deceased.   Lives with youngest Son. Daughter, Kennyth Lose, comes during the day to help patient with ADLs and stays until her brother gets home.    Social Determinants of Health   Financial Resource Strain: Low Risk    Difficulty of Paying Living Expenses: Not hard at all  Food Insecurity: No Food Insecurity   Worried About Charity fundraiser in the Last Year: Never true   [place] in the Last Year: Never true  Transportation Needs: No Transportation Needs   Lack of Transportation (Medical): No   Lack of Transportation (Non-Medical): No  Physical Activity: Insufficiently Active   Days of Exercise per Week: 5 days   Minutes of Exercise per Session: 20 min  Stress: No Stress Concern Present   Feeling of Stress : Not at all  Social Connections: Socially Isolated   Frequency of Communication with Friends and Family: More than three times a week   Frequency of Social Gatherings with Friends and Family: More than three times a week   Attends Religious Services: Never   Marine scientist or Organizations: No   Attends Archivist Meetings: Never   Marital Status: Widowed  Human resources officer Violence: Not At Risk   Fear of Current or Ex-Partner: No   Emotionally Abused: No   Physically Abused: No   Sexually Abused: No    Outpatient Medications Prior to Visit  Medication Sig Dispense Refill   acetaminophen (TYLENOL) 325 MG tablet Take 650 mg by mouth as needed.     aspirin 81 MG tablet Take 1 tablet (81 mg total) by mouth daily.     blood  glucose meter kit and supplies May test up to 4 times a day. E11.9 1 each 0   Calcium Carbonate-Vitamin D (CALCIUM-VITAMIN D) 600-125 MG-UNIT TABS Take 1 tablet by mouth 2 (two) times daily. 90 tablet 6   glimepiride (AMARYL) 4 MG tablet Take 2 tablets (8 mg total) by mouth daily. 180 tablet 0   Iron, Ferrous Sulfate, 325 (65 Fe) MG TABS Take 325 mg by mouth daily. 30 tablet 2   lisinopril (ZESTRIL) 40 MG tablet Take 40 mg by mouth daily. 90 tablet 3   metFORMIN (GLUCOPHAGE) 500 MG tablet TAKE 1 TABLET DAILY 90 tablet 0   metoprolol tartrate (LOPRESSOR) 25 MG tablet Take 1 tablet (25 mg total) by mouth 2 (two) times daily. Call office to make appointment for further refills. 180 tablet 3   NITROSTAT 0.4 MG SL tablet Take 0.4 mg by mouth every 5 (five) minutes as  needed for chest pain.      ONETOUCH VERIO test strip daily.     rosuvastatin (CRESTOR) 20 MG tablet TAKE 1 TABLET DAILY 90 tablet 1   No facility-administered medications prior to visit.    Allergies  Allergen Reactions   Penicillins Hives    ROS Review of Systems  Constitutional: Negative.   HENT: Negative.    Eyes: Negative.   Respiratory: Negative.    Cardiovascular: Negative.   Gastrointestinal: Negative.   Genitourinary: Negative.   Musculoskeletal: Negative.   Psychiatric/Behavioral: Negative.    All other systems reviewed and are negative.    Objective:    Physical Exam Vitals and nursing note reviewed.  Constitutional:      Appearance: Normal appearance.  HENT:     Head: Normocephalic.     Right Ear: Ear canal and external ear normal.     Left Ear: Ear canal and external ear normal.     Nose: Nose normal.     Mouth/Throat:     Mouth: Mucous membranes are moist.     Pharynx: Oropharynx is clear.  Eyes:     Conjunctiva/sclera: Conjunctivae normal.  Cardiovascular:     Rate and Rhythm: Normal rate and regular rhythm.     Pulses: Normal pulses.     Heart sounds: Normal heart sounds.  Pulmonary:      Effort: Pulmonary effort is normal.     Breath sounds: Normal breath sounds.  Abdominal:     General: Bowel sounds are normal.  Skin:    General: Skin is warm.     Findings: No rash.  Neurological:     Mental Status: She is alert and oriented to person, place, and time.  Psychiatric:        Mood and Affect: Mood normal.        Behavior: Behavior normal.    BP (!) 150/75   Pulse 92   Temp 97.9 F (36.6 C) (Temporal)   Ht 5' (1.524 m)   Wt 94 lb (42.6 kg)   SpO2 100%   BMI 18.36 kg/m  Wt Readings from Last 3 Encounters:  10/23/21 94 lb (42.6 kg)  07/19/21 94 lb (42.6 kg)  07/17/21 94 lb (42.6 kg)     Health Maintenance Due  Topic Date Due   Pneumonia Vaccine 75+ Years old (1 - PCV) Never done   OPHTHALMOLOGY EXAM  Never done   Zoster Vaccines- Shingrix (1 of 2) Never done   COVID-19 Vaccine (4 - Booster for Moderna series) 02/08/2021   HEMOGLOBIN A1C  10/19/2021    There are no preventive care reminders to display for this patient.  No results found for: TSH Lab Results  Component Value Date   WBC 5.3 07/17/2021   HGB 9.5 (L) 07/17/2021   HCT 31.2 (L) 07/17/2021   MCV 89 07/17/2021   PLT 289 07/17/2021   Lab Results  Component Value Date   NA 141 07/17/2021   K 4.4 07/17/2021   CO2 24 07/17/2021   GLUCOSE 114 (H) 07/17/2021   BUN 15 07/17/2021   CREATININE 1.03 (H) 07/17/2021   BILITOT 0.4 07/17/2021   ALKPHOS 37 (L) 07/17/2021   AST 18 07/17/2021   ALT 10 07/17/2021   PROT 6.8 07/17/2021   ALBUMIN 4.3 07/17/2021   CALCIUM 9.8 07/17/2021   ANIONGAP 4 (L) 08/13/2015   EGFR 53 (L) 07/17/2021   GFR 74.22 03/21/2013   Lab Results  Component Value Date   CHOL 144 07/17/2021  Lab Results  Component Value Date   HDL 56 07/17/2021   Lab Results  Component Value Date   LDLCALC 67 07/17/2021   Lab Results  Component Value Date   TRIG 117 07/17/2021   Lab Results  Component Value Date   CHOLHDL 2.6 07/17/2021   Lab Results  Component Value  Date   HGBA1C 7.4 (H) 04/19/2021      Assessment & Plan:   Problem List Items Addressed This Visit   None   No orders of the defined types were placed in this encounter.   Follow-up: No follow-ups on file.    Ivy Lynn, NP

## 2021-10-23 NOTE — Patient Instructions (Signed)
Diabetes Mellitus and Exercise Exercising regularly is important for overall health, especially for people who have diabetes mellitus. Exercising is not only about losing weight. It has many other health benefits, such as increasing muscle strength and bone density and reducing body fat and stress. This leads to improved fitness, flexibility, and endurance, all of which result in better overall health. What are the benefits of exercise if I have diabetes? Exercise has many benefits for people with diabetes. They include: Helping to lower and control blood sugar (glucose). Helping the body to respond better to the hormone insulin by improving insulin sensitivity. Reducing how much insulin the body needs. Lowering the risk for heart disease by: Lowering "bad" cholesterol and triglyceride levels. Increasing "good" cholesterol levels. Lowering blood pressure. Lowering blood glucose levels. What is my activity plan? Your health care provider or certified diabetes educator can help you make a plan for the type and frequency of exercise that works for you. This is called your activity plan. Be sure to: Get at least 150 minutes of medium-intensity or high-intensity exercise each week. Exercises may include brisk walking, biking, or water aerobics. Do stretching and strengthening exercises, such as yoga or weight lifting, at least 2 times a week. Spread out your activity over at least 3 days of the week. Get some form of physical activity each day. Do not go more than 2 days in a row without some kind of physical activity. Avoid being inactive for more than 90 minutes at a time. Take frequent breaks to walk or stretch. Choose exercises or activities that you enjoy. Set realistic goals. Start slowly and gradually increase your exercise intensity over time. How do I manage my diabetes during exercise? Monitor your blood glucose Check your blood glucose before and after exercising. If your blood glucose  is: 240 mg/dL (13.3 mmol/L) or higher before you exercise, check your urine for ketones. These are chemicals created by the liver. If you have ketones in your urine, do not exercise until your blood glucose returns to normal. 100 mg/dL (5.6 mmol/L) or lower, eat a snack containing 15-20 grams of carbohydrate. Check your blood glucose 15 minutes after the snack to make sure that your glucose level is above 100 mg/dL (5.6 mmol/L) before you start your exercise. Know the symptoms of low blood glucose (hypoglycemia) and how to treat it. Your risk for hypoglycemia increases during and after exercise. Follow these tips and your health care provider's instructions Keep a carbohydrate snack that is fast-acting for use before, during, and after exercise to help prevent or treat hypoglycemia. Avoid injecting insulin into areas of the body that are going to be exercised. For example, avoid injecting insulin into: Your arms, when you are about to play tennis. Your legs, when you are about to go jogging. Keep records of your exercise habits. Doing this can help you and your health care provider adjust your diabetes management plan as needed. Write down: Food that you eat before and after you exercise. Blood glucose levels before and after you exercise. The type and amount of exercise you have done. Work with your health care provider when you start a new exercise or activity. He or she may need to: Make sure that the activity is safe for you. Adjust your insulin, other medicines, and food that you eat. Drink plenty of water while you exercise. This prevents loss of water (dehydration) and problems caused by a lot of heat in the body (heat stroke). Where to find more information   American Diabetes Association: www.diabetes.org Summary Exercising regularly is important for overall health, especially for people who have diabetes mellitus. Exercising has many health benefits. It increases muscle strength and bone  density and reduces body fat and stress. It also lowers and controls blood glucose. Your health care provider or certified diabetes educator can help you make an activity plan for the type and frequency of exercise that works for you. Work with your health care provider to make sure any new activity is safe for you. Also work with your health care provider to adjust your insulin, other medicines, and the food you eat. This information is not intended to replace advice given to you by your health care provider. Make sure you discuss any questions you have with your health care provider. Document Revised: 08/01/2019 Document Reviewed: 08/01/2019 Elsevier Patient Education  2022 [employer] Diabetes Mellitus and Foot Care Foot care is an important part of your health, especially when you have diabetes. Diabetes may cause you to have problems because of poor blood flow (circulation) to your feet and legs, which can cause your skin to: Become thinner and drier. Break more easily. Heal more slowly. Peel and crack. You may also have nerve damage (neuropathy) in your legs and feet, causing decreased feeling in them. This means that you may not notice minor injuries to your feet that could lead to more serious problems. Noticing and addressing any potential problems early is the best way to prevent future foot problems. How to care for your feet Foot hygiene  Wash your feet daily with warm water and mild soap. Do not use hot water. Then, pat your feet and the areas between your toes until they are completely dry. Do not soak your feet as this can dry your skin. Trim your toenails straight across. Do not dig under them or around the cuticle. File the edges of your nails with an emery board or nail file. Apply a moisturizing lotion or petroleum jelly to the skin on your feet and to dry, brittle toenails. Use lotion that does not contain alcohol and is unscented. Do not apply lotion between your  toes. Shoes and socks Wear clean socks or stockings every day. Make sure they are not too tight. Do not wear knee-high stockings since they may decrease blood flow to your legs. Wear shoes that fit properly and have enough cushioning. Always look in your shoes before you put them on to be sure there are no objects inside. To break in new shoes, wear them for just a few hours a day. This prevents injuries on your feet. Wounds, scrapes, corns, and calluses  Check your feet daily for blisters, cuts, bruises, sores, and redness. If you cannot see the bottom of your feet, use a mirror or ask someone for help. Do not cut corns or calluses or try to remove them with medicine. If you find a minor scrape, cut, or break in the skin on your feet, keep it and the skin around it clean and dry. You may clean these areas with mild soap and water. Do not clean the area with peroxide, alcohol, or iodine. If you have a wound, scrape, corn, or callus on your foot, look at it several times a day to make sure it is healing and not infected. Check for: Redness, swelling, or pain. Fluid or blood. Warmth. Pus or a bad smell. General tips Do not cross your legs. This may decrease blood flow to your feet. Do not use heating  pads or hot water bottles on your feet. They may burn your skin. If you have lost feeling in your feet or legs, you may not know this is happening until it is too late. Protect your feet from hot and cold by wearing shoes, such as at the beach or on hot pavement. Schedule a complete foot exam at least once a year (annually) or more often if you have foot problems. Report any cuts, sores, or bruises to your health care provider immediately. Where to find more information American Diabetes Association: www.diabetes.org Association of Diabetes Care & Education Specialists: www.diabeteseducator.org Contact a health care provider if: You have a medical condition that increases your risk of infection and  you have any cuts, sores, or bruises on your feet. You have an injury that is not healing. You have redness on your legs or feet. You feel burning or tingling in your legs or feet. You have pain or cramps in your legs and feet. Your legs or feet are numb. Your feet always feel cold. You have pain around any toenails. Get help right away if: You have a wound, scrape, corn, or callus on your foot and: You have pain, swelling, or redness that gets worse. You have fluid or blood coming from the wound, scrape, corn, or callus. Your wound, scrape, corn, or callus feels warm to the touch. You have pus or a bad smell coming from the wound, scrape, corn, or callus. You have a fever. You have a red line going up your leg. Summary Check your feet every day for blisters, cuts, bruises, sores, and redness. Apply a moisturizing lotion or petroleum jelly to the skin on your feet and to dry, brittle toenails. Wear shoes that fit properly and have enough cushioning. If you have foot problems, report any cuts, sores, or bruises to your health care provider immediately. Schedule a complete foot exam at least once a year (annually) or more often if you have foot problems. This information is not intended to replace advice given to you by your health care provider. Make sure you discuss any questions you have with your health care provider. Document Revised: 05/24/2020 Document Reviewed: 05/24/2020 Elsevier Patient Education  2022 [employer] Hypertension, Adult Hypertension is another name for high blood pressure. High blood pressure forces your heart to work harder to pump blood. This can cause problems over time. There are two numbers in a blood pressure reading. There is a top number (systolic) over a bottom number (diastolic). It is best to have a blood pressure that is below 120/80. Healthy choices can help lower your blood pressure, or you may need medicine to help lower it. What are the  causes? The cause of this condition is not known. Some conditions may be related to high blood pressure. What increases the risk? Smoking. Having type 2 diabetes mellitus, high cholesterol, or both. Not getting enough exercise or physical activity. Being overweight. Having too much fat, sugar, calories, or salt (sodium) in your diet. Drinking too much alcohol. Having long-term (chronic) kidney disease. Having a family history of high blood pressure. Age. Risk increases with age. Race. You may be at higher risk if you are African American. Gender. Men are at higher risk than women before age 76. After age 65, women are at higher risk than men. Having obstructive sleep apnea. Stress. What are the signs or symptoms? High blood pressure may not cause symptoms. Very high blood pressure (hypertensive crisis) may cause: Headache. Feelings of worry or  nervousness (anxiety). Shortness of breath. Nosebleed. A feeling of being sick to your stomach (nausea). Throwing up (vomiting). Changes in how you see. Very bad chest pain. Seizures. How is this treated? This condition is treated by making healthy lifestyle changes, such as: Eating healthy foods. Exercising more. Drinking less alcohol. Your health care provider may prescribe medicine if lifestyle changes are not enough to get your blood pressure under control, and if: Your top number is above 130. Your bottom number is above 80. Your personal target blood pressure may vary. Follow these instructions at home: Eating and drinking  If told, follow the DASH eating plan. To follow this plan: Fill one half of your plate at each meal with fruits and vegetables. Fill one fourth of your plate at each meal with whole grains. Whole grains include whole-wheat pasta, brown rice, and whole-grain bread. Eat or drink low-fat dairy products, such as skim milk or low-fat yogurt. Fill one fourth of your plate at each meal with low-fat (lean) proteins.  Low-fat proteins include fish, chicken without skin, eggs, beans, and tofu. Avoid fatty meat, cured and processed meat, or chicken with skin. Avoid pre-made or processed food. Eat less than 1,500 mg of salt each day. Do not drink alcohol if: Your doctor tells you not to drink. You are pregnant, may be pregnant, or are planning to become pregnant. If you drink alcohol: Limit how much you use to: 0-1 drink a day for women. 0-2 drinks a day for men. Be aware of how much alcohol is in your drink. In the [place] one drink equals one 12 oz bottle of beer (355 mL), one 5 oz glass of wine (148 mL), or one 1 oz glass of hard liquor (44 mL). Lifestyle  Work with your doctor to stay at a healthy weight or to lose weight. Ask your doctor what the best weight is for you. Get at least 30 minutes of exercise most days of the week. This may include walking, swimming, or biking. Get at least 30 minutes of exercise that strengthens your muscles (resistance exercise) at least 3 days a week. This may include lifting weights or doing Pilates. Do not use any products that contain nicotine or tobacco, such as cigarettes, e-cigarettes, and chewing tobacco. If you need help quitting, ask your doctor. Check your blood pressure at home as told by your doctor. Keep all follow-up visits as told by your doctor. This is important. Medicines Take over-the-counter and prescription medicines only as told by your doctor. Follow directions carefully. Do not skip doses of blood pressure medicine. The medicine does not work as well if you skip doses. Skipping doses also puts you at risk for problems. Ask your doctor about side effects or reactions to medicines that you should watch for. Contact a doctor if you: Think you are having a reaction to the medicine you are taking. Have headaches that keep coming back (recurring). Feel dizzy. Have swelling in your ankles. Have trouble with your vision. Get help right away if  you: Get a very bad headache. Start to feel mixed up (confused). Feel weak or numb. Feel faint. Have very bad pain in your: Chest. Belly (abdomen). Throw up more than once. Have trouble breathing. Summary Hypertension is another name for high blood pressure. High blood pressure forces your heart to work harder to pump blood. For most people, a normal blood pressure is less than 120/80. Making healthy choices can help lower blood pressure. If your blood pressure does not get  lower with healthy choices, you may need to take medicine. This information is not intended to replace advice given to you by your health care provider. Make sure you discuss any questions you have with your health care provider. Document Revised: 07/14/2018 Document Reviewed: 07/14/2018 Elsevier Patient Education  2022 [employer].

## 2021-10-23 NOTE — Assessment & Plan Note (Signed)
No new signs and symptoms of hyperlipidemia.  Patient continues to eat a low-cholesterol diet.  Lipid panel labs completed results pending.

## 2021-10-23 NOTE — Assessment & Plan Note (Signed)
No changes to current medication dose.  Symptoms well controlled.  Elevation in clinic today due to patient not taking blood pressure medication before appointment.  Completed labs CBC, CMP, lipid panel.  Follow-up in 3 months.

## 2021-10-28 LAB — HGB A1C W/O EAG: Hgb A1c MFr Bld: 7 % — ABNORMAL HIGH (ref 4.8–5.6)

## 2021-10-28 LAB — SPECIMEN STATUS REPORT

## 2021-10-30 NOTE — Progress Notes (Signed)
Pt's daughter returning call. Please call back.

## 2021-11-04 ENCOUNTER — Other Ambulatory Visit: Payer: Self-pay | Admitting: Nurse Practitioner

## 2021-11-19 ENCOUNTER — Other Ambulatory Visit: Payer: Self-pay | Admitting: Cardiology

## 2021-12-03 ENCOUNTER — Encounter: Payer: Self-pay | Admitting: *Deleted

## 2021-12-24 ENCOUNTER — Other Ambulatory Visit: Payer: Self-pay | Admitting: Nurse Practitioner

## 2022-01-21 ENCOUNTER — Ambulatory Visit (INDEPENDENT_AMBULATORY_CARE_PROVIDER_SITE_OTHER): Payer: Medicare HMO | Admitting: Nurse Practitioner

## 2022-01-21 ENCOUNTER — Encounter: Payer: Self-pay | Admitting: Nurse Practitioner

## 2022-01-21 VITALS — BP 153/57 | HR 68 | Temp 98.8°F | Ht 60.0 in | Wt 99.0 lb

## 2022-01-21 DIAGNOSIS — I10 Essential (primary) hypertension: Secondary | ICD-10-CM

## 2022-01-21 DIAGNOSIS — E119 Type 2 diabetes mellitus without complications: Secondary | ICD-10-CM | POA: Diagnosis not present

## 2022-01-21 DIAGNOSIS — E785 Hyperlipidemia, unspecified: Secondary | ICD-10-CM

## 2022-01-21 DIAGNOSIS — E1169 Type 2 diabetes mellitus with other specified complication: Secondary | ICD-10-CM

## 2022-01-21 NOTE — Assessment & Plan Note (Signed)
DM well controlled on current medication.  No changes necessary.  Continue diabetic diet, A1c completed results pending. ?

## 2022-01-21 NOTE — Assessment & Plan Note (Addendum)
Symptoms well managed on aspirin and statin.  

## 2022-01-21 NOTE — Patient Instructions (Signed)
Diabetes Mellitus Basics Diabetes mellitus, or diabetes, is a long-term (chronic) disease. It occurs when the body does not properly use sugar (glucose) that is released from food after you eat. Diabetes mellitus may be caused by one or both of these problems: Your pancreas does not make enough of a hormone called insulin. Your body does not react in a normal way to the insulin that it makes. Insulin lets glucose enter cells in your body. This gives you energy. If you have diabetes, glucose cannot get into cells. This causes high blood glucose (hyperglycemia). How to treat and manage diabetes You may need to take insulin or other diabetes medicines daily to keep your glucose in balance. If you are prescribed insulin, you will learn how to give yourself insulin by injection. You may need to adjust the amount of insulin you take based on the foods that you eat. You will need to check your blood glucose levels using a glucose monitor as told by your health care provider. The readings can help determine if you have low or high blood glucose. Generally, you should have these blood glucose levels: Before meals (preprandial): 80-130 mg/dL (4.4-7.2 mmol/L). After meals (postprandial): below 180 mg/dL (10 mmol/L). Hemoglobin A1c (HbA1c) level: less than 7%. Your health care provider will set treatment goals for you. Keep all follow-up visits. This is important. Follow these instructions at home: Diabetes medicines Take your diabetes medicines every day as told by your health care provider. List your diabetes medicines here: Name of medicine: ______________________________ Amount (dose): _______________ Time (a.m./p.m.): _______________ Notes: ___________________________________ Name of medicine: ______________________________ Amount (dose): _______________ Time (a.m./p.m.): _______________ Notes: ___________________________________ Name of medicine: ______________________________ Amount (dose):  _______________ Time (a.m./p.m.): _______________ Notes: ___________________________________ Insulin If you use insulin, list the types of insulin you use here: Insulin type: ______________________________ Amount (dose): _______________ Time (a.m./p.m.): _______________Notes: ___________________________________ Insulin type: ______________________________ Amount (dose): _______________ Time (a.m./p.m.): _______________ Notes: ___________________________________ Insulin type: ______________________________ Amount (dose): _______________ Time (a.m./p.m.): _______________ Notes: ___________________________________ Insulin type: ______________________________ Amount (dose): _______________ Time (a.m./p.m.): _______________ Notes: ___________________________________ Insulin type: ______________________________ Amount (dose): _______________ Time (a.m./p.m.): _______________ Notes: ___________________________________ Managing blood glucose Check your blood glucose levels using a glucose monitor as told by your health care provider. Write down the times that you check your glucose levels here: Time: _______________ Notes: ___________________________________ Time: _______________ Notes: ___________________________________ Time: _______________ Notes: ___________________________________ Time: _______________ Notes: ___________________________________ Time: _______________ Notes: ___________________________________ Time: _______________ Notes: ___________________________________  Low blood glucose Low blood glucose (hypoglycemia) is when glucose is at or below 70 mg/dL (3.9 mmol/L). Symptoms may include: Feeling: Hungry. Sweaty and clammy. Irritable or easily upset. Dizzy. Sleepy. Having: A fast heartbeat. A headache. A change in your vision. Numbness around the mouth, lips, or tongue. Having trouble with: Moving (coordination). Sleeping. Treating low blood glucose To treat low blood  glucose, eat or drink something containing sugar right away. If you can think clearly and swallow safely, follow the 15:15 rule: Take 15 grams of a fast-acting carb (carbohydrate), as told by your health care provider. Some fast-acting carbs are: Glucose tablets: take 3-4 tablets. Hard candy: eat 3-5 pieces. Fruit juice: drink 4 oz (120 mL). Regular (not diet) soda: drink 4-6 oz (120-180 mL). Honey or sugar: eat 1 Tbsp (15 mL). Check your blood glucose levels 15 minutes after you take the carb. If your glucose is still at or below 70 mg/dL (3.9 mmol/L), take 15 grams of a carb again. If your glucose does not go above 70 mg/dL (3.9 mmol/L) after 3 tries,  get help right away. After your glucose goes back to normal, eat a meal or a snack within 1 hour. Treating very low blood glucose If your glucose is at or below 54 mg/dL (3 mmol/L), you have very low blood glucose (severe hypoglycemia). This is an emergency. Do not wait to see if the symptoms will go away. Get medical help right away. Call your local emergency services (911 in the [place] Do not drive yourself to the hospital. Questions to ask your health care provider Should I talk with a diabetes educator? What equipment will I need to care for myself at home? What diabetes medicines do I need? When should I take them? How often do I need to check my blood glucose levels? What number can I call if I have questions? When is my follow-up visit? Where can I find a support group for people with diabetes? Where to find more information American Diabetes Association: www.diabetes.org Association of Diabetes Care and Education Specialists: www.diabeteseducator.org Contact a health care provider if: Your blood glucose is at or above 240 mg/dL (13.3 mmol/L) for 2 days in a row. You have been sick or have had a fever for 2 days or more, and you are not getting better. You have any of these problems for more than 6 hours: You cannot eat or  drink. You feel nauseous. You vomit. You have diarrhea. Get help right away if: Your blood glucose is lower than 54 mg/dL (3 mmol/L). You get confused. You have trouble thinking clearly. You have trouble breathing. These symptoms may represent a serious problem that is an emergency. Do not wait to see if the symptoms will go away. Get medical help right away. Call your local emergency services (911 in the [place] Do not drive yourself to the hospital. Summary Diabetes mellitus is a chronic disease that occurs when the body does not properly use sugar (glucose) that is released from food after you eat. Take insulin and diabetes medicines as told. Check your blood glucose every day, as often as told. Keep all follow-up visits. This is important. This information is not intended to replace advice given to you by your health care provider. Make sure you discuss any questions you have with your health care provider. Document Revised: 03/06/2020 Document Reviewed: 03/06/2020 Elsevier Patient Education  2022 [employer]. Hypertension, Adult Hypertension is another name for high blood pressure. High blood pressure forces your heart to work harder to pump blood. This can cause problems over time. There are two numbers in a blood pressure reading. There is a top number (systolic) over a bottom number (diastolic). It is best to have a blood pressure that is below 120/80. Healthy choices can help lower your blood pressure, or you may need medicine to help lower it. What are the causes? The cause of this condition is not known. Some conditions may be related to high blood pressure. What increases the risk? Smoking. Having type 2 diabetes mellitus, high cholesterol, or both. Not getting enough exercise or physical activity. Being overweight. Having too much fat, sugar, calories, or salt (sodium) in your diet. Drinking too much alcohol. Having long-term (chronic) kidney disease. Having a family  history of high blood pressure. Age. Risk increases with age. Race. You may be at higher risk if you are African American. Gender. Men are at higher risk than women before age 70. After age 46, women are at higher risk than men. Having obstructive sleep apnea. Stress. What are the signs or symptoms? High  blood pressure may not cause symptoms. Very high blood pressure (hypertensive crisis) may cause: Headache. Feelings of worry or nervousness (anxiety). Shortness of breath. Nosebleed. A feeling of being sick to your stomach (nausea). Throwing up (vomiting). Changes in how you see. Very bad chest pain. Seizures. How is this treated? This condition is treated by making healthy lifestyle changes, such as: Eating healthy foods. Exercising more. Drinking less alcohol. Your health care provider may prescribe medicine if lifestyle changes are not enough to get your blood pressure under control, and if: Your top number is above 130. Your bottom number is above 80. Your personal target blood pressure may vary. Follow these instructions at home: Eating and drinking  If told, follow the DASH eating plan. To follow this plan: Fill one half of your plate at each meal with fruits and vegetables. Fill one fourth of your plate at each meal with whole grains. Whole grains include whole-wheat pasta, brown rice, and whole-grain bread. Eat or drink low-fat dairy products, such as skim milk or low-fat yogurt. Fill one fourth of your plate at each meal with low-fat (lean) proteins. Low-fat proteins include fish, chicken without skin, eggs, beans, and tofu. Avoid fatty meat, cured and processed meat, or chicken with skin. Avoid pre-made or processed food. Eat less than 1,500 mg of salt each day. Do not drink alcohol if: Your doctor tells you not to drink. You are pregnant, may be pregnant, or are planning to become pregnant. If you drink alcohol: Limit how much you use to: 0-1 drink a day for  women. 0-2 drinks a day for men. Be aware of how much alcohol is in your drink. In the [place] one drink equals one 12 oz bottle of beer (355 mL), one 5 oz glass of wine (148 mL), or one 1 oz glass of hard liquor (44 mL). Lifestyle  Work with your doctor to stay at a healthy weight or to lose weight. Ask your doctor what the best weight is for you. Get at least 30 minutes of exercise most days of the week. This may include walking, swimming, or biking. Get at least 30 minutes of exercise that strengthens your muscles (resistance exercise) at least 3 days a week. This may include lifting weights or doing Pilates. Do not use any products that contain nicotine or tobacco, such as cigarettes, e-cigarettes, and chewing tobacco. If you need help quitting, ask your doctor. Check your blood pressure at home as told by your doctor. Keep all follow-up visits as told by your doctor. This is important. Medicines Take over-the-counter and prescription medicines only as told by your doctor. Follow directions carefully. Do not skip doses of blood pressure medicine. The medicine does not work as well if you skip doses. Skipping doses also puts you at risk for problems. Ask your doctor about side effects or reactions to medicines that you should watch for. Contact a doctor if you: Think you are having a reaction to the medicine you are taking. Have headaches that keep coming back (recurring). Feel dizzy. Have swelling in your ankles. Have trouble with your vision. Get help right away if you: Get a very bad headache. Start to feel mixed up (confused). Feel weak or numb. Feel faint. Have very bad pain in your: Chest. Belly (abdomen). Throw up more than once. Have trouble breathing. Summary Hypertension is another name for high blood pressure. High blood pressure forces your heart to work harder to pump blood. For most people, a normal blood pressure is  less than 120/80. Making healthy choices can help  lower blood pressure. If your blood pressure does not get lower with healthy choices, you may need to take medicine. This information is not intended to replace advice given to you by your health care provider. Make sure you discuss any questions you have with your health care provider. Document Revised: 07/14/2018 Document Reviewed: 07/14/2018 Elsevier Patient Education  [employer].

## 2022-01-21 NOTE — Progress Notes (Signed)
Established Patient Office Visit  Subjective:  Patient ID: Kelly Castillo, female    DOB: September 12, 1934  Age: 88 y.o. MRN: [id number]  CC:  Chief Complaint  Patient presents with   chronic disease management    HPI Kelly Castillo presents for follow up of hypertension. Patient was diagnosed in 12/13/2009. The patient is tolerating the medication well without side effects. Compliance with treatment has been good; including taking medication as directed , maintains a healthy diet and regular exercise regimen , and following up as directed.     Diabetes Mellitus Type II, Follow-up: Patient here for follow-up of Type 2 diabetes mellitus.  Current symptoms/problems include none and have been stable. Symptoms have been present for a few years.  Known diabetic complications: none Cardiovascular risk factors: advanced age (older than 27 for men, 69 for women), diabetes mellitus, and hypertension Current diabetic medications include  metformin 500 mg tablet by mouth, glimepiride .   Eye exam current (within one year): no Weight trend: stable Prior visit with dietician: no Current diet: well balanced, diabetic Current exercise: none  Current monitoring regimen:  before medication administration Home blood sugar records: fasting range: 60-120 Any episodes of hypoglycemia? no  Is She on ACE inhibitor or angiotensin II receptor blocker?  Yes  lisinopril (Zestril)       Past Medical History:  Diagnosis Date   Anemia    GERD (gastroesophageal reflux disease)    HTN (hypertension)    Hypercholesterolemia    LBBB (left bundle branch block)    Lumbar disc disease    Non-ischemic cardiomyopathy (HCC)    Poor short term memory    Type II diabetes mellitus ([place])     Past Surgical History:  Procedure Laterality Date   APPENDECTOMY  1970's   [place]   CARDIAC CATHETERIZATION N/A 08/08/2015   Procedure: Left Heart Cath and Coronary Angiography;  Surgeon: Belva Crome, MD;   Location: [facility];  Service: Cardiovascular;  Laterality: N/A;   CARDIAC CATHETERIZATION  01/31/2014   CATARACT EXTRACTION W/ INTRAOCULAR LENS IMPLANT Right    CATARACT EXTRACTION W/PHACO  07/24/2011   Procedure: CATARACT EXTRACTION PHACO AND INTRAOCULAR LENS PLACEMENT (IOC);  Surgeon: Tonny Branch;  Location: [facility];  Service: Ophthalmology;  Laterality: Left;  CDE: 14.73   CORONARY ARTERY BYPASS GRAFT N/A 08/10/2015   Procedure: OFF PUMP CORONARY ARTERY BYPASS GRAFTING (CABG) UTILIZING THE LEFT INTERNAL MAMMARY ARTERY;  Surgeon: Melrose Nakayama, MD;  Location: [facility];  Service: Open Heart Surgery;  Laterality: N/A;   DILATION AND CURETTAGE OF UTERUS     EYE SURGERY Bilateral    "laser; related to diabetes"   FRACTURE SURGERY     LEFT HEART CATHETERIZATION WITH CORONARY ANGIOGRAM N/A 01/31/2014   Procedure: LEFT HEART CATHETERIZATION WITH CORONARY ANGIOGRAM;  Surgeon: Peter M Martinique, MD;  Location: [facility];  Service: Cardiovascular;  Laterality: N/A;   ORIF ANKLE FRACTURE Right 1980's   TEE WITHOUT CARDIOVERSION N/A 08/10/2015   Procedure: TRANSESOPHAGEAL ECHOCARDIOGRAM (TEE);  Surgeon: Melrose Nakayama, MD;  Location: [facility];  Service: Open Heart Surgery;  Laterality: N/A;   TUBAL LIGATION     VAGINAL HYSTERECTOMY  1980's    Family History  Problem Relation Age of Onset   Anesthesia problems Neg Hx    Hypotension Neg Hx    Malignant hyperthermia Neg Hx    Pseudochol deficiency Neg Hx     Social History   Socioeconomic History   Marital status:  Widowed    Spouse name: Not on file   Number of children: 10   Years of education: Not on file   Highest education level: Not on file  Occupational History   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  Tobacco Use   Smoking status: Never   Smokeless tobacco: Never  Substance and Sexual Activity   Alcohol use: No    Alcohol/week: 0.0 standard drinks   Drug use: No   Sexual activity: Yes     Birth control/protection: Surgical  Other Topics Concern   Not on file  Social History Narrative   9 children living and 1 deceased.   Lives with youngest Son. Daughter, Kelly Castillo, comes during the day to help patient with ADLs and stays until her brother gets home.    Social Determinants of Health   Financial Resource Strain: Low Risk    Difficulty of Paying Living Expenses: Not hard at all  Food Insecurity: No Food Insecurity   Worried About Charity fundraiser in the Last Year: Never true   [place] in the Last Year: Never true  Transportation Needs: No Transportation Needs   Lack of Transportation (Medical): No   Lack of Transportation (Non-Medical): No  Physical Activity: Insufficiently Active   Days of Exercise per Week: 5 days   Minutes of Exercise per Session: 20 min  Stress: No Stress Concern Present   Feeling of Stress : Not at all  Social Connections: Socially Isolated   Frequency of Communication with Friends and Family: More than three times a week   Frequency of Social Gatherings with Friends and Family: More than three times a week   Attends Religious Services: Never   Marine scientist or Organizations: No   Attends Archivist Meetings: Never   Marital Status: Widowed  Human resources officer Violence: Not At Risk   Fear of Current or Ex-Partner: No   Emotionally Abused: No   Physically Abused: No   Sexually Abused: No    Outpatient Medications Prior to Visit  Medication Sig Dispense Refill   acetaminophen (TYLENOL) 325 MG tablet Take 650 mg by mouth as needed.     aspirin 81 MG tablet Take 1 tablet (81 mg total) by mouth daily.     blood glucose meter kit and supplies May test up to 4 times a day. E11.9 1 each 0   Calcium Carbonate-Vitamin D (CALCIUM-VITAMIN D) 600-125 MG-UNIT TABS Take 1 tablet by mouth 2 (two) times daily. 90 tablet 6   glimepiride (AMARYL) 4 MG tablet TAKE TWO TABLETS ONCE DAILY 180 tablet 0   Iron, Ferrous Sulfate, 325  (65 Fe) MG TABS Take 325 mg by mouth daily. 30 tablet 5   lisinopril (ZESTRIL) 40 MG tablet Take 1 tablet (40 mg total) by mouth daily. 90 tablet 3   metFORMIN (GLUCOPHAGE) 500 MG tablet TAKE 1 TABLET DAILY 90 tablet 0   metoprolol tartrate (LOPRESSOR) 25 MG tablet TAKE (1) TABLET TWICE A DAY. 180 tablet 3   NITROSTAT 0.4 MG SL tablet Take 0.4 mg by mouth every 5 (five) minutes as needed for chest pain.      ONETOUCH VERIO test strip daily.     rosuvastatin (CRESTOR) 20 MG tablet TAKE 1 TABLET DAILY 90 tablet 1   No facility-administered medications prior to visit.    Allergies  Allergen Reactions   Penicillins Hives    ROS Review of Systems  Constitutional: Negative.   HENT:  Negative.    Eyes: Negative.   Respiratory: Negative.    Gastrointestinal: Negative.   Musculoskeletal: Negative.   Skin:  Negative for rash.  All other systems reviewed and are negative.    Objective:    Physical Exam Vitals and nursing note reviewed.  Constitutional:      Appearance: Normal appearance.  HENT:     Head: Normocephalic.     Right Ear: External ear normal.     Left Ear: External ear normal.     Mouth/Throat:     Mouth: Mucous membranes are moist.     Pharynx: Oropharynx is clear.  Eyes:     Conjunctiva/sclera: Conjunctivae normal.  Cardiovascular:     Rate and Rhythm: Normal rate and regular rhythm.     Pulses: Normal pulses.     Heart sounds: Normal heart sounds.  Pulmonary:     Effort: Pulmonary effort is normal.     Breath sounds: Normal breath sounds.  Abdominal:     General: Bowel sounds are normal.  Musculoskeletal:        General: Normal range of motion.  Skin:    General: Skin is warm.     Findings: No rash.  Neurological:     Mental Status: She is alert and oriented to person, place, and time.  Psychiatric:        Mood and Affect: Mood normal.        Behavior: Behavior normal.    BP (!) 153/57    Pulse 68    Temp 98.8 F (37.1 C)    Ht 5' (1.524 m)    Wt 99  lb (44.9 kg)    SpO2 98%    BMI 19.33 kg/m  Wt Readings from Last 3 Encounters:  01/21/22 99 lb (44.9 kg)  10/23/21 94 lb (42.6 kg)  07/19/21 94 lb (42.6 kg)     Health Maintenance Due  Topic Date Due   OPHTHALMOLOGY EXAM  Never done   FOOT EXAM  11/06/2021    There are no preventive care reminders to display for this patient.  No results found for: TSH Lab Results  Component Value Date   WBC 5.0 10/23/2021   HGB 10.2 (L) 10/23/2021   HCT 31.3 (L) 10/23/2021   MCV 86 10/23/2021   PLT 250 10/23/2021   Lab Results  Component Value Date   NA 139 10/23/2021   K 5.0 10/23/2021   CO2 27 10/23/2021   GLUCOSE 179 (H) 10/23/2021   BUN 24 10/23/2021   CREATININE 1.19 (H) 10/23/2021   BILITOT 0.3 10/23/2021   ALKPHOS 35 (L) 10/23/2021   AST 19 10/23/2021   ALT 9 10/23/2021   PROT 6.6 10/23/2021   ALBUMIN 4.5 10/23/2021   CALCIUM 10.2 10/23/2021   ANIONGAP 4 (L) 08/13/2015   EGFR 45 (L) 10/23/2021   GFR 74.22 03/21/2013   Lab Results  Component Value Date   CHOL 141 10/23/2021   Lab Results  Component Value Date   HDL 60 10/23/2021   Lab Results  Component Value Date   LDLCALC 67 10/23/2021   Lab Results  Component Value Date   TRIG 70 10/23/2021   Lab Results  Component Value Date   CHOLHDL 2.4 10/23/2021   Lab Results  Component Value Date   HGBA1C 7.0 (H) 10/23/2021      Assessment & Plan:   Problem List Items Addressed This Visit       Cardiovascular and Mediastinum   Essential hypertension  Hypertension well-controlled on current medication.  No changes necessary.  Continue low-sodium diet.  Labs completed CBC, CMP,      Relevant Orders   Lipid Panel   CBC with Differential   Comprehensive metabolic panel     Endocrine   Diabetes mellitus type 2 in nonobese ([place]) - Primary    DM well controlled on current medication.  No changes necessary.  Continue diabetic diet, A1c completed results pending.      Relevant Orders   Bayer DCA Hb  A1c Waived   Hyperlipidemia associated with type 2 diabetes mellitus ([place])    Completed lipid panel results pending.       No orders of the defined types were placed in this encounter.   Follow-up: Return in about 3 months (around 04/23/2022).    Ivy Lynn, NP

## 2022-01-21 NOTE — Assessment & Plan Note (Signed)
Hypertension well-controlled on current medication.  No changes necessary.  Continue low-sodium diet.  Labs completed CBC, CMP, ?

## 2022-01-21 NOTE — Assessment & Plan Note (Signed)
Completed lipid panel results pending. 

## 2022-01-31 ENCOUNTER — Other Ambulatory Visit: Payer: Medicare HMO

## 2022-01-31 DIAGNOSIS — I10 Essential (primary) hypertension: Secondary | ICD-10-CM | POA: Diagnosis not present

## 2022-01-31 DIAGNOSIS — E119 Type 2 diabetes mellitus without complications: Secondary | ICD-10-CM | POA: Diagnosis not present

## 2022-01-31 LAB — BAYER DCA HB A1C WAIVED: HB A1C (BAYER DCA - WAIVED): 6.4 % — ABNORMAL HIGH (ref 4.8–5.6)

## 2022-02-01 LAB — CBC WITH DIFFERENTIAL/PLATELET
Basophils Absolute: 0 10*3/uL (ref 0.0–0.2)
Basos: 1 %
EOS (ABSOLUTE): 0.3 10*3/uL (ref 0.0–0.4)
Eos: 5 %
Hematocrit: 31.9 % — ABNORMAL LOW (ref 34.0–46.6)
Hemoglobin: 9.9 g/dL — ABNORMAL LOW (ref 11.1–15.9)
Immature Grans (Abs): 0 10*3/uL (ref 0.0–0.1)
Immature Granulocytes: 0 %
Lymphocytes Absolute: 1.5 10*3/uL (ref 0.7–3.1)
Lymphs: 28 %
MCH: 26.6 pg (ref 26.6–33.0)
MCHC: 31 g/dL — ABNORMAL LOW (ref 31.5–35.7)
MCV: 86 fL (ref 79–97)
Monocytes Absolute: 0.5 10*3/uL (ref 0.1–0.9)
Monocytes: 9 %
Neutrophils Absolute: 2.9 10*3/uL (ref 1.4–7.0)
Neutrophils: 57 %
Platelets: 284 10*3/uL (ref 150–450)
RBC: 3.72 x10E6/uL — ABNORMAL LOW (ref 3.77–5.28)
RDW: 14.2 % (ref 11.7–15.4)
WBC: 5.2 10*3/uL (ref 3.4–10.8)

## 2022-02-01 LAB — COMPREHENSIVE METABOLIC PANEL
ALT: 10 IU/L (ref 0–32)
AST: 14 IU/L (ref 0–40)
Albumin/Globulin Ratio: 2 (ref 1.2–2.2)
Albumin: 4.6 g/dL (ref 3.6–4.6)
Alkaline Phosphatase: 38 IU/L — ABNORMAL LOW (ref 44–121)
BUN/Creatinine Ratio: 25 (ref 12–28)
BUN: 28 mg/dL — ABNORMAL HIGH (ref 8–27)
Bilirubin Total: 0.3 mg/dL (ref 0.0–1.2)
CO2: 26 mmol/L (ref 20–29)
Calcium: 10.1 mg/dL (ref 8.7–10.3)
Chloride: 103 mmol/L (ref 96–106)
Creatinine, Ser: 1.13 mg/dL — ABNORMAL HIGH (ref 0.57–1.00)
Globulin, Total: 2.3 g/dL (ref 1.5–4.5)
Glucose: 181 mg/dL — ABNORMAL HIGH (ref 70–99)
Potassium: 4.6 mmol/L (ref 3.5–5.2)
Sodium: 140 mmol/L (ref 134–144)
Total Protein: 6.9 g/dL (ref 6.0–8.5)
eGFR: 47 mL/min/{1.73_m2} — ABNORMAL LOW (ref >59)

## 2022-02-01 LAB — LIPID PANEL
Chol/HDL Ratio: 2.9 ratio (ref 0.0–4.4)
Cholesterol, Total: 170 mg/dL (ref 100–199)
HDL: 59 mg/dL (ref >39)
LDL Chol Calc (NIH): 92 mg/dL (ref 0–99)
Triglycerides: 105 mg/dL (ref 0–149)
VLDL Cholesterol Cal: 19 mg/dL (ref 5–40)

## 2022-02-03 ENCOUNTER — Other Ambulatory Visit: Payer: Self-pay | Admitting: Nurse Practitioner

## 2022-03-06 ENCOUNTER — Other Ambulatory Visit: Payer: Self-pay | Admitting: Nurse Practitioner

## 2022-03-06 ENCOUNTER — Other Ambulatory Visit: Payer: Self-pay | Admitting: Cardiology

## 2022-04-23 ENCOUNTER — Ambulatory Visit: Payer: Medicare HMO | Admitting: Nurse Practitioner

## 2022-04-25 ENCOUNTER — Ambulatory Visit: Payer: Medicare HMO | Admitting: Nurse Practitioner

## 2022-04-29 ENCOUNTER — Ambulatory Visit (INDEPENDENT_AMBULATORY_CARE_PROVIDER_SITE_OTHER): Payer: Medicare HMO | Admitting: Nurse Practitioner

## 2022-04-29 ENCOUNTER — Other Ambulatory Visit: Payer: Self-pay | Admitting: Nurse Practitioner

## 2022-04-29 ENCOUNTER — Encounter: Payer: Self-pay | Admitting: Nurse Practitioner

## 2022-04-29 VITALS — BP 168/66 | HR 68 | Ht 60.0 in | Wt 98.8 lb

## 2022-04-29 DIAGNOSIS — E785 Hyperlipidemia, unspecified: Secondary | ICD-10-CM

## 2022-04-29 DIAGNOSIS — E1169 Type 2 diabetes mellitus with other specified complication: Secondary | ICD-10-CM | POA: Diagnosis not present

## 2022-04-29 DIAGNOSIS — M79676 Pain in unspecified toe(s): Secondary | ICD-10-CM | POA: Diagnosis not present

## 2022-04-29 DIAGNOSIS — L84 Corns and callosities: Secondary | ICD-10-CM | POA: Diagnosis not present

## 2022-04-29 DIAGNOSIS — I10 Essential (primary) hypertension: Secondary | ICD-10-CM

## 2022-04-29 DIAGNOSIS — E119 Type 2 diabetes mellitus without complications: Secondary | ICD-10-CM

## 2022-04-29 DIAGNOSIS — E1142 Type 2 diabetes mellitus with diabetic polyneuropathy: Secondary | ICD-10-CM | POA: Diagnosis not present

## 2022-04-29 DIAGNOSIS — B351 Tinea unguium: Secondary | ICD-10-CM | POA: Diagnosis not present

## 2022-04-29 LAB — BAYER DCA HB A1C WAIVED: HB A1C (BAYER DCA - WAIVED): 6.1 % — ABNORMAL HIGH (ref 4.8–5.6)

## 2022-04-29 MED ORDER — LISINOPRIL 40 MG PO TABS: 40.0000 mg | ORAL_TABLET | Freq: Every day | ORAL | 3 refills | Status: DC

## 2022-04-29 MED ORDER — IRON (FERROUS SULFATE) 325 (65 FE) MG PO TABS: 325.0000 mg | ORAL_TABLET | Freq: Every day | ORAL | 5 refills | Status: DC

## 2022-04-29 MED ORDER — METFORMIN HCL 500 MG PO TABS: 500.0000 mg | ORAL_TABLET | Freq: Every day | ORAL | 1 refills | Status: DC

## 2022-04-29 MED ORDER — METOPROLOL TARTRATE 25 MG PO TABS: ORAL_TABLET | ORAL | 3 refills | Status: DC

## 2022-04-29 NOTE — Patient Instructions (Signed)
Diabetes Mellitus Basics  Diabetes mellitus, or diabetes, is a long-term (chronic) disease. It occurs when the body does not properly use sugar (glucose) that is released from food after you eat. Diabetes mellitus may be caused by one or both of these problems: Your pancreas does not make enough of a hormone called insulin. Your body does not react in a normal way to the insulin that it makes. Insulin lets glucose enter cells in your body. This gives you energy. If you have diabetes, glucose cannot get into cells. This causes high blood glucose (hyperglycemia). How to treat and manage diabetes You may need to take insulin or other diabetes medicines daily to keep your glucose in balance. If you are prescribed insulin, you will learn how to give yourself insulin by injection. You may need to adjust the amount of insulin you take based on the foods that you eat. You will need to check your blood glucose levels using a glucose monitor as told by your health care provider. The readings can help determine if you have low or high blood glucose. Generally, you should have these blood glucose levels: Before meals (preprandial): 80-130 mg/dL (4.4-7.2 mmol/L). After meals (postprandial): below 180 mg/dL (10 mmol/L). Hemoglobin A1c (HbA1c) level: less than 7%. Your health care provider will set treatment goals for you. Keep all follow-up visits. This is important. Follow these instructions at home: Diabetes medicines Take your diabetes medicines every day as told by your health care provider. List your diabetes medicines here: Name of medicine: ______________________________ Amount (dose): _______________ Time (a.m./p.m.): _______________ Notes: ___________________________________ Name of medicine: ______________________________ Amount (dose): _______________ Time (a.m./p.m.): _______________ Notes: ___________________________________ Name of medicine: ______________________________ Amount (dose):  _______________ Time (a.m./p.m.): _______________ Notes: ___________________________________ Insulin If you use insulin, list the types of insulin you use here: Insulin type: ______________________________ Amount (dose): _______________ Time (a.m./p.m.): _______________Notes: ___________________________________ Insulin type: ______________________________ Amount (dose): _______________ Time (a.m./p.m.): _______________ Notes: ___________________________________ Insulin type: ______________________________ Amount (dose): _______________ Time (a.m./p.m.): _______________ Notes: ___________________________________ Insulin type: ______________________________ Amount (dose): _______________ Time (a.m./p.m.): _______________ Notes: ___________________________________ Insulin type: ______________________________ Amount (dose): _______________ Time (a.m./p.m.): _______________ Notes: ___________________________________ Managing blood glucose  Check your blood glucose levels using a glucose monitor as told by your health care provider. Write down the times that you check your glucose levels here: Time: _______________ Notes: ___________________________________ Time: _______________ Notes: ___________________________________ Time: _______________ Notes: ___________________________________ Time: _______________ Notes: ___________________________________ Time: _______________ Notes: ___________________________________ Time: _______________ Notes: ___________________________________  Low blood glucose Low blood glucose (hypoglycemia) is when glucose is at or below 70 mg/dL (3.9 mmol/L). Symptoms may include: Feeling: Hungry. Sweaty and clammy. Irritable or easily upset. Dizzy. Sleepy. Having: A fast heartbeat. A headache. A change in your vision. Numbness around the mouth, lips, or tongue. Having trouble with: Moving (coordination). Sleeping. Treating low blood glucose To treat low blood  glucose, eat or drink something containing sugar right away. If you can think clearly and swallow safely, follow the 15:15 rule: Take 15 grams of a fast-acting carb (carbohydrate), as told by your health care provider. Some fast-acting carbs are: Glucose tablets: take 3-4 tablets. Hard candy: eat 3-5 pieces. Fruit juice: drink 4 oz (120 mL). Regular (not diet) soda: drink 4-6 oz (120-180 mL). Honey or sugar: eat 1 Tbsp (15 mL). Check your blood glucose levels 15 minutes after you take the carb. If your glucose is still at or below 70 mg/dL (3.9 mmol/L), take 15 grams of a carb again. If your glucose does not go above 70 mg/dL (3.9 mmol/L) after   3 tries, get help right away. After your glucose goes back to normal, eat a meal or a snack within 1 hour. Treating very low blood glucose If your glucose is at or below 54 mg/dL (3 mmol/L), you have very low blood glucose (severe hypoglycemia). This is an emergency. Do not wait to see if the symptoms will go away. Get medical help right away. Call your local emergency services (911 in the [place] Do not drive yourself to the hospital. Questions to ask your health care provider Should I talk with a diabetes educator? What equipment will I need to care for myself at home? What diabetes medicines do I need? When should I take them? How often do I need to check my blood glucose levels? What number can I call if I have questions? When is my follow-up visit? Where can I find a support group for people with diabetes? Where to find more information American Diabetes Association: www.diabetes.org Association of Diabetes Care and Education Specialists: www.diabeteseducator.org Contact a health care provider if: Your blood glucose is at or above 240 mg/dL (13.3 mmol/L) for 2 days in a row. You have been sick or have had a fever for 2 days or more, and you are not getting better. You have any of these problems for more than 6 hours: You cannot eat or  drink. You feel nauseous. You vomit. You have diarrhea. Get help right away if: Your blood glucose is lower than 54 mg/dL (3 mmol/L). You get confused. You have trouble thinking clearly. You have trouble breathing. These symptoms may represent a serious problem that is an emergency. Do not wait to see if the symptoms will go away. Get medical help right away. Call your local emergency services (911 in the [place] Do not drive yourself to the hospital. Summary Diabetes mellitus is a chronic disease that occurs when the body does not properly use sugar (glucose) that is released from food after you eat. Take insulin and diabetes medicines as told. Check your blood glucose every day, as often as told. Keep all follow-up visits. This is important. This information is not intended to replace advice given to you by your health care provider. Make sure you discuss any questions you have with your health care provider. Document Revised: 03/06/2020 Document Reviewed: 03/06/2020 Elsevier Patient Education  2023 Elsevier Inc. Hypertension, Adult Hypertension is another name for high blood pressure. High blood pressure forces your heart to work harder to pump blood. This can cause problems over time. There are two numbers in a blood pressure reading. There is a top number (systolic) over a bottom number (diastolic). It is best to have a blood pressure that is below 120/80. What are the causes? The cause of this condition is not known. Some other conditions can lead to high blood pressure. What increases the risk? Some lifestyle factors can make you more likely to develop high blood pressure: Smoking. Not getting enough exercise or physical activity. Being overweight. Having too much fat, sugar, calories, or salt (sodium) in your diet. Drinking too much alcohol. Other risk factors include: Having any of these conditions: Heart disease. Diabetes. High cholesterol. Kidney disease. Obstructive  sleep apnea. Having a family history of high blood pressure and high cholesterol. Age. The risk increases with age. Stress. What are the signs or symptoms? High blood pressure may not cause symptoms. Very high blood pressure (hypertensive crisis) may cause: Headache. Fast or uneven heartbeats (palpitations). Shortness of breath. Nosebleed. Vomiting or feeling like you may   vomit (nauseous). Changes in how you see. Very bad chest pain. Feeling dizzy. Seizures. How is this treated? This condition is treated by making healthy lifestyle changes, such as: Eating healthy foods. Exercising more. Drinking less alcohol. Your doctor may prescribe medicine if lifestyle changes do not help enough and if: Your top number is above 130. Your bottom number is above 80. Your personal target blood pressure may vary. Follow these instructions at home: Eating and drinking  If told, follow the DASH eating plan. To follow this plan: Fill one half of your plate at each meal with fruits and vegetables. Fill one fourth of your plate at each meal with whole grains. Whole grains include whole-wheat pasta, brown rice, and whole-grain bread. Eat or drink low-fat dairy products, such as skim milk or low-fat yogurt. Fill one fourth of your plate at each meal with low-fat (lean) proteins. Low-fat proteins include fish, chicken without skin, eggs, beans, and tofu. Avoid fatty meat, cured and processed meat, or chicken with skin. Avoid pre-made or processed food. Limit the amount of salt in your diet to less than 1,500 mg each day. Do not drink alcohol if: Your doctor tells you not to drink. You are pregnant, may be pregnant, or are planning to become pregnant. If you drink alcohol: Limit how much you have to: 0-1 drink a day for women. 0-2 drinks a day for men. Know how much alcohol is in your drink. In the [place] one drink equals one 12 oz bottle of beer (355 mL), one 5 oz glass of wine (148 mL), or one 1  oz glass of hard liquor (44 mL). Lifestyle  Work with your doctor to stay at a healthy weight or to lose weight. Ask your doctor what the best weight is for you. Get at least 30 minutes of exercise that causes your heart to beat faster (aerobic exercise) most days of the week. This may include walking, swimming, or biking. Get at least 30 minutes of exercise that strengthens your muscles (resistance exercise) at least 3 days a week. This may include lifting weights or doing Pilates. Do not smoke or use any products that contain nicotine or tobacco. If you need help quitting, ask your doctor. Check your blood pressure at home as told by your doctor. Keep all follow-up visits. Medicines Take over-the-counter and prescription medicines only as told by your doctor. Follow directions carefully. Do not skip doses of blood pressure medicine. The medicine does not work as well if you skip doses. Skipping doses also puts you at risk for problems. Ask your doctor about side effects or reactions to medicines that you should watch for. Contact a doctor if: You think you are having a reaction to the medicine you are taking. You have headaches that keep coming back. You feel dizzy. You have swelling in your ankles. You have trouble with your vision. Get help right away if: You get a very bad headache. You start to feel mixed up (confused). You feel weak or numb. You feel faint. You have very bad pain in your: Chest. Belly (abdomen). You vomit more than once. You have trouble breathing. These symptoms may be an emergency. Get help right away. Call 911. Do not wait to see if the symptoms will go away. Do not drive yourself to the hospital. Summary Hypertension is another name for high blood pressure. High blood pressure forces your heart to work harder to pump blood. For most people, a normal blood pressure is less than 120/80.  Making healthy choices can help lower blood pressure. If your blood  pressure does not get lower with healthy choices, you may need to take medicine. This information is not intended to replace advice given to you by your health care provider. Make sure you discuss any questions you have with your health care provider. Document Revised: 08/22/2021 Document Reviewed: 08/22/2021 Elsevier Patient Education  [employer].

## 2022-04-29 NOTE — Progress Notes (Signed)
Established Patient Office Visit  Subjective   Patient ID: Kelly Castillo, female    DOB: 1934-05-07  Age: 88 y.o. MRN: [id number]  Chief Complaint  Patient presents with   Follow-up    3 months   Diabetes    Pt states she has been good , checking it at home     Diabetes She presents for her follow-up diabetic visit. She has type 2 diabetes mellitus. The initial diagnosis of diabetes was made 12 years ago. Her disease course has been stable. Pertinent negatives for hypoglycemia include no headaches. There are no diabetic associated symptoms. Pertinent negatives for diabetes include no chest pain. There are no hypoglycemic complications. Symptoms are stable. There are no diabetic complications. Risk factors for coronary artery disease include hypertension. Current diabetic treatment includes oral agent (dual therapy). She is compliant with treatment all of the time. She is following a diabetic diet. When asked about meal planning, she reported none. She has not had a previous visit with a dietitian. She participates in exercise intermittently. She monitors urine at home 1-2 x per day. Blood glucose monitoring compliance is good. Her home blood glucose trend is decreasing steadily. An ACE inhibitor/angiotensin II receptor blocker is being taken. She sees a podiatrist.Eye exam is current.  Hypertension This is a chronic problem. The current episode started more than 1 year ago (2011). The problem has been gradually improving since onset. The problem is controlled. Pertinent negatives include no chest pain, headaches, malaise/fatigue or shortness of breath. Risk factors for coronary artery disease include diabetes mellitus and dyslipidemia. Past treatments include ACE inhibitors, calcium channel blockers and lifestyle changes. The current treatment provides significant improvement. There are no compliance problems.   Hyperlipidemia This is a chronic problem. The current episode started more than 1  year ago (05/13/2021). The problem is controlled. Recent lipid tests were reviewed and are variable. Exacerbating diseases include diabetes. There are no known factors aggravating her hyperlipidemia. Pertinent negatives include no chest pain, focal weakness or shortness of breath. The current treatment provides significant improvement of lipids. There are no compliance problems.     Patient Active Problem List   Diagnosis Date Noted   Hyperlipidemia associated with type 2 diabetes mellitus ([place]) 04/19/2021   Low bone density 02/10/2021   Annual physical exam 01/15/2021   CAD (coronary artery disease), native coronary artery 09/25/2015   S/P CABG x 1 08/10/2015   Angina pectoris, crescendo ([place]) 08/08/2015   Abnormal nuclear stress test    Diabetes mellitus type 2 in nonobese ([place]) 12/13/2009   MITRAL REGURGITATION 12/13/2009   Essential hypertension 12/13/2009   CARDIOMYOPATHY 12/13/2009   GERD 12/13/2009   Past Medical History:  Diagnosis Date   Anemia    GERD (gastroesophageal reflux disease)    HTN (hypertension)    Hypercholesterolemia    LBBB (left bundle branch block)    Lumbar disc disease    Non-ischemic cardiomyopathy ([place])    Poor short term memory    Type II diabetes mellitus ([place])    Past Surgical History:  Procedure Laterality Date   APPENDECTOMY  1970's   [place]   CARDIAC CATHETERIZATION N/A 08/08/2015   Procedure: Left Heart Cath and Coronary Angiography;  Surgeon: Belva Crome, MD;  Location: [facility];  Service: Cardiovascular;  Laterality: N/A;   CARDIAC CATHETERIZATION  01/31/2014   CATARACT EXTRACTION W/ INTRAOCULAR LENS IMPLANT Right    CATARACT EXTRACTION W/PHACO  07/24/2011   Procedure: CATARACT EXTRACTION PHACO AND INTRAOCULAR  LENS PLACEMENT (IOC);  Surgeon: Tonny Branch;  Location: [facility];  Service: Ophthalmology;  Laterality: Left;  CDE: 14.73   CORONARY ARTERY BYPASS GRAFT N/A 08/10/2015   Procedure: OFF PUMP CORONARY ARTERY BYPASS GRAFTING  (CABG) UTILIZING THE LEFT INTERNAL MAMMARY ARTERY;  Surgeon: Melrose Nakayama, MD;  Location: [facility];  Service: Open Heart Surgery;  Laterality: N/A;   DILATION AND CURETTAGE OF UTERUS     EYE SURGERY Bilateral    "laser; related to diabetes"   FRACTURE SURGERY     LEFT HEART CATHETERIZATION WITH CORONARY ANGIOGRAM N/A 01/31/2014   Procedure: LEFT HEART CATHETERIZATION WITH CORONARY ANGIOGRAM;  Surgeon: Peter M Martinique, MD;  Location[facility];  Service: Cardiovascular;  Laterality: N/A;   ORIF ANKLE FRACTURE Right 1980's   TEE WITHOUT CARDIOVERSION N/A 08/10/2015   Procedure: TRANSESOPHAGEAL ECHOCARDIOGRAM (TEE);  Surgeon: Melrose Nakayama, MD;  Location: [facility];  Service: Open Heart Surgery;  Laterality: N/A;   TUBAL LIGATION     VAGINAL HYSTERECTOMY  1980's   Social History   Tobacco Use   Smoking status: Never   Smokeless tobacco: Never  Substance Use Topics   Alcohol use: No    Alcohol/week: 0.0 standard drinks of alcohol   Drug use: No   Social History   Socioeconomic History   Marital status: Widowed    Spouse name: Not on file   Number of children: 10   Years of education: Not on file   Highest education level: Not on file  Occupational History   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  Tobacco Use   Smoking status: Never   Smokeless tobacco: Never  Substance and Sexual Activity   Alcohol use: No    Alcohol/week: 0.0 standard drinks of alcohol   Drug use: No   Sexual activity: Yes    Birth control/protection: Surgical  Other Topics Concern   Not on file  Social History Narrative   9 children living and 1 deceased.   Lives with youngest Son. Daughter, Kennyth Lose, comes during the day to help patient with ADLs and stays until her brother gets home.    Social Determinants of Health   Financial Resource Strain: Low Risk  (07/19/2021)   Overall Financial Resource Strain (CARDIA)    Difficulty of Paying Living Expenses: Not hard  at all  Food Insecurity: No Food Insecurity (07/19/2021)   Hunger Vital Sign    Worried About Running Out of Food in the Last Year: Never true    Ran Out of Food in the Last Year: Never true  Transportation Needs: No Transportation Needs (07/19/2021)   PRAPARE - Hydrologist (Medical): No    Lack of Transportation (Non-Medical): No  Physical Activity: Insufficiently Active (07/19/2021)   Exercise Vital Sign    Days of Exercise per Week: 5 days    Minutes of Exercise per Session: 20 min  Stress: No Stress Concern Present (07/19/2021)   [place]    Feeling of Stress : Not at all  Social Connections: Socially Isolated (07/19/2021)   Social Connection and Isolation Panel [NHANES]    Frequency of Communication with Friends and Family: More than three times a week    Frequency of Social Gatherings with Friends and Family: More than three times a week    Attends Religious Services: Never    Marine scientist or Organizations: No    Attends [employer]  or Organization Meetings: Never    Marital Status: Widowed  Intimate Partner Violence: Not At Risk (07/19/2021)   Humiliation, Afraid, Rape, and Kick questionnaire    Fear of Current or Ex-Partner: No    Emotionally Abused: No    Physically Abused: No    Sexually Abused: No   Family Status  Relation Name Status   Mother  Deceased   Father  Deceased   Neg Hx  (Not Specified)   Family History  Problem Relation Age of Onset   Anesthesia problems Neg Hx    Hypotension Neg Hx    Malignant hyperthermia Neg Hx    Pseudochol deficiency Neg Hx    Allergies  Allergen Reactions   Penicillins Hives      Review of Systems  Constitutional: Negative.  Negative for malaise/fatigue.  HENT: Negative.    Eyes: Negative.   Respiratory: Negative.  Negative for shortness of breath.   Cardiovascular: Negative.  Negative for chest pain.  Gastrointestinal:  Negative.   Genitourinary: Negative.   Musculoskeletal: Negative.   Skin: Negative.   Neurological: Negative.  Negative for focal weakness and headaches.  Psychiatric/Behavioral: Negative.    All other systems reviewed and are negative.     Objective:     BP (!) 168/66 (BP Location: Left Arm, Patient Position: Sitting, Cuff Size: Normal)   Pulse 68   Ht 5' (1.524 m)   Wt 98 lb 12.8 oz (44.8 kg)   SpO2 95%   BMI 19.30 kg/m  BP Readings from Last 3 Encounters:  04/29/22 (!) 168/66  01/21/22 (!) 153/57  10/23/21 (!) 150/75   Wt Readings from Last 3 Encounters:  04/29/22 98 lb 12.8 oz (44.8 kg)  01/21/22 99 lb (44.9 kg)  10/23/21 94 lb (42.6 kg)      Physical Exam Vitals and nursing note reviewed.  Constitutional:      Appearance: Normal appearance.  HENT:     Head: Normocephalic.     Right Ear: External ear normal.     Left Ear: External ear normal.     Nose: Nose normal.     Mouth/Throat:     Mouth: Mucous membranes are moist.     Pharynx: Oropharynx is clear.  Eyes:     Conjunctiva/sclera: Conjunctivae normal.  Cardiovascular:     Rate and Rhythm: Normal rate and regular rhythm.     Pulses: Normal pulses.     Heart sounds: Normal heart sounds.  Pulmonary:     Effort: Pulmonary effort is normal.     Breath sounds: Normal breath sounds.  Abdominal:     General: Bowel sounds are normal.  Musculoskeletal:        General: Normal range of motion.  Skin:    General: Skin is warm.     Findings: No rash.  Neurological:     General: No focal deficit present.     Mental Status: She is alert and oriented to person, place, and time.  Psychiatric:        Mood and Affect: Mood normal.        Behavior: Behavior normal.      No results found for any visits on 04/29/22.  Last CBC Lab Results  Component Value Date   WBC 5.2 01/31/2022   HGB 9.9 (L) 01/31/2022   HCT 31.9 (L) 01/31/2022   MCV 86 01/31/2022   MCH 26.6 01/31/2022   RDW 14.2 01/31/2022   PLT  284 03/49/1791   Last metabolic panel Lab Results  Component Value Date   GLUCOSE 181 (H) 01/31/2022   NA 140 01/31/2022   K 4.6 01/31/2022   CL 103 01/31/2022   CO2 26 01/31/2022   BUN 28 (H) 01/31/2022   CREATININE 1.13 (H) 01/31/2022   EGFR 47 (L) 01/31/2022   CALCIUM 10.1 01/31/2022   PROT 6.9 01/31/2022   ALBUMIN 4.6 01/31/2022   LABGLOB 2.3 01/31/2022   AGRATIO 2.0 01/31/2022   BILITOT 0.3 01/31/2022   ALKPHOS 38 (L) 01/31/2022   AST 14 01/31/2022   ALT 10 01/31/2022   ANIONGAP 4 (L) 08/13/2015   Last lipids Lab Results  Component Value Date   CHOL 170 01/31/2022   HDL 59 01/31/2022   LDLCALC 92 01/31/2022   TRIG 105 01/31/2022   CHOLHDL 2.9 01/31/2022   Last hemoglobin A1c Lab Results  Component Value Date   HGBA1C 6.4 (H) 01/31/2022   Last vitamin D No results found for: "25OHVITD2", "25OHVITD3", "VD25OH"    The ASCVD Risk score (Arnett DK, et al., 2019) failed to calculate for the following reasons:   The 2019 ASCVD risk score is only valid for ages 47 to 35    Assessment & Plan:   Problem List Items Addressed This Visit       Cardiovascular and Mediastinum   Essential hypertension   Relevant Medications   lisinopril (ZESTRIL) 40 MG tablet   metoprolol tartrate (LOPRESSOR) 25 MG tablet   Iron, Ferrous Sulfate, 325 (65 Fe) MG TABS     Endocrine   Diabetes mellitus type 2 in nonobese (HCC) - Primary    No new or worsening symptoms of diabetes.  Symptoms are well controlled on current medication no changes necessary.  Completed foot exams.  Patient will see podiatry today to point nails.  A1c is trending down.  Completed CBC, CMP results pending.  Follow-up in 3 months.      Relevant Medications   lisinopril (ZESTRIL) 40 MG tablet   metFORMIN (GLUCOPHAGE) 500 MG tablet   metoprolol tartrate (LOPRESSOR) 25 MG tablet   Other Relevant Orders   CBC with Differential/Platelet   CMP14+EGFR   Bayer DCA Hb A1c Waived   Hyperlipidemia associated  with type 2 diabetes mellitus (HCC)    Signs and symptoms of hyperlipidemia.  Labs completed lipid panel results pending.  Continue low-cholesterol diet and exercise as tolerated.      Relevant Medications   lisinopril (ZESTRIL) 40 MG tablet   metFORMIN (GLUCOPHAGE) 500 MG tablet   metoprolol tartrate (LOPRESSOR) 25 MG tablet   Other Relevant Orders   Lipid panel    Return in about 3 months (around 07/30/2022).    Ivy Lynn, NP

## 2022-04-29 NOTE — Assessment & Plan Note (Signed)
No new or worsening symptoms of diabetes.  Symptoms are well controlled on current medication no changes necessary.  Completed foot exams.  Patient will see podiatry today to point nails.  A1c is trending down.  Completed CBC, CMP results pending.  Follow-up in 3 months.

## 2022-04-29 NOTE — Assessment & Plan Note (Signed)
Signs and symptoms of hyperlipidemia.  Labs completed lipid panel results pending.  Continue low-cholesterol diet and exercise as tolerated.

## 2022-04-30 LAB — CBC WITH DIFFERENTIAL/PLATELET
Basophils Absolute: 0 10*3/uL (ref 0.0–0.2)
Basos: 0 %
EOS (ABSOLUTE): 0.2 10*3/uL (ref 0.0–0.4)
Eos: 3 %
Hematocrit: 30.9 % — ABNORMAL LOW (ref 34.0–46.6)
Hemoglobin: 9.8 g/dL — ABNORMAL LOW (ref 11.1–15.9)
Immature Grans (Abs): 0 10*3/uL (ref 0.0–0.1)
Immature Granulocytes: 0 %
Lymphocytes Absolute: 1.5 10*3/uL (ref 0.7–3.1)
Lymphs: 17 %
MCH: 27.4 pg (ref 26.6–33.0)
MCHC: 31.7 g/dL (ref 31.5–35.7)
MCV: 86 fL (ref 79–97)
Monocytes Absolute: 0.6 10*3/uL (ref 0.1–0.9)
Monocytes: 6 %
Neutrophils Absolute: 6.7 10*3/uL (ref 1.4–7.0)
Neutrophils: 74 %
Platelets: 280 10*3/uL (ref 150–450)
RBC: 3.58 x10E6/uL — ABNORMAL LOW (ref 3.77–5.28)
RDW: 16.2 % — ABNORMAL HIGH (ref 11.7–15.4)
WBC: 9.1 10*3/uL (ref 3.4–10.8)

## 2022-04-30 LAB — CMP14+EGFR
ALT: 9 IU/L (ref 0–32)
AST: 17 IU/L (ref 0–40)
Albumin/Globulin Ratio: 1.7 (ref 1.2–2.2)
Albumin: 4.4 g/dL (ref 3.6–4.6)
Alkaline Phosphatase: 35 IU/L — ABNORMAL LOW (ref 44–121)
BUN/Creatinine Ratio: 21 (ref 12–28)
BUN: 23 mg/dL (ref 8–27)
Bilirubin Total: 0.4 mg/dL (ref 0.0–1.2)
CO2: 24 mmol/L (ref 20–29)
Calcium: 9.9 mg/dL (ref 8.7–10.3)
Chloride: 104 mmol/L (ref 96–106)
Creatinine, Ser: 1.12 mg/dL — ABNORMAL HIGH (ref 0.57–1.00)
Globulin, Total: 2.6 g/dL (ref 1.5–4.5)
Glucose: 142 mg/dL — ABNORMAL HIGH (ref 70–99)
Potassium: 4.4 mmol/L (ref 3.5–5.2)
Sodium: 143 mmol/L (ref 134–144)
Total Protein: 7 g/dL (ref 6.0–8.5)
eGFR: 48 mL/min/{1.73_m2} — ABNORMAL LOW (ref >59)

## 2022-04-30 LAB — LIPID PANEL
Chol/HDL Ratio: 2.9 ratio (ref 0.0–4.4)
Cholesterol, Total: 156 mg/dL (ref 100–199)
HDL: 54 mg/dL (ref >39)
LDL Chol Calc (NIH): 82 mg/dL (ref 0–99)
Triglycerides: 110 mg/dL (ref 0–149)
VLDL Cholesterol Cal: 20 mg/dL (ref 5–40)

## 2022-05-04 ENCOUNTER — Other Ambulatory Visit: Payer: Self-pay | Admitting: Nurse Practitioner

## 2022-05-15 ENCOUNTER — Other Ambulatory Visit: Payer: Self-pay | Admitting: Nurse Practitioner

## 2022-07-23 ENCOUNTER — Ambulatory Visit: Payer: Medicare HMO

## 2022-07-30 ENCOUNTER — Ambulatory Visit: Payer: Medicare HMO | Admitting: Nurse Practitioner

## 2022-08-05 ENCOUNTER — Ambulatory Visit: Payer: Medicare HMO | Admitting: Nurse Practitioner

## 2022-08-18 ENCOUNTER — Other Ambulatory Visit: Payer: Self-pay | Admitting: Nurse Practitioner

## 2022-08-20 ENCOUNTER — Ambulatory Visit: Payer: Medicare HMO | Attending: Cardiology | Admitting: Nurse Practitioner

## 2022-08-20 ENCOUNTER — Encounter: Payer: Self-pay | Admitting: Nurse Practitioner

## 2022-08-20 VITALS — BP 150/72 | HR 62 | Ht 60.0 in | Wt 99.6 lb

## 2022-08-20 DIAGNOSIS — Z8673 Personal history of transient ischemic attack (TIA), and cerebral infarction without residual deficits: Secondary | ICD-10-CM | POA: Diagnosis not present

## 2022-08-20 DIAGNOSIS — I071 Rheumatic tricuspid insufficiency: Secondary | ICD-10-CM

## 2022-08-20 DIAGNOSIS — E118 Type 2 diabetes mellitus with unspecified complications: Secondary | ICD-10-CM

## 2022-08-20 DIAGNOSIS — E785 Hyperlipidemia, unspecified: Secondary | ICD-10-CM

## 2022-08-20 DIAGNOSIS — I340 Nonrheumatic mitral (valve) insufficiency: Secondary | ICD-10-CM | POA: Diagnosis not present

## 2022-08-20 DIAGNOSIS — I2510 Atherosclerotic heart disease of native coronary artery without angina pectoris: Secondary | ICD-10-CM

## 2022-08-20 DIAGNOSIS — I351 Nonrheumatic aortic (valve) insufficiency: Secondary | ICD-10-CM

## 2022-08-20 DIAGNOSIS — I10 Essential (primary) hypertension: Secondary | ICD-10-CM | POA: Diagnosis not present

## 2022-08-20 NOTE — Patient Instructions (Addendum)
Medication Instructions:  Your physician recommends that you continue on your current medications as directed. Please refer to the Current Medication list given to you today.   *If you need a refill on your cardiac medications before your next appointment, please call your pharmacy*   Lab Work: NONE ordered at this time of appointment   If you have labs (blood work) drawn today and your tests are completely normal, you will receive your results only by: [place] (if you have MyChart) OR A paper copy in the mail If you have any lab test that is abnormal or we need to change your treatment, we will call you to review the results.   Testing/Procedures: NONE ordered at this time of appointment     Follow-Up: At [facility], you and your health needs are our priority.  As part of our continuing mission to provide you with exceptional heart care, we have created designated Provider Care Teams.  These Care Teams include your primary Cardiologist (physician) and Advanced Practice Providers (APPs -  Physician Assistants and Nurse Practitioners) who all work together to provide you with the care you need, when you need it.  We recommend signing up for the patient portal called "MyChart".  Sign up information is provided on this After Visit Summary.  MyChart is used to connect with patients for Virtual Visits (Telemedicine).  Patients are able to view lab/test results, encounter notes, upcoming appointments, etc.  Non-urgent messages can be sent to your provider as well.   To learn more about what you can do with MyChart, go to [email or web address].    Your next appointment:   6 month(s)  The format for your next appointment:   In Person  Provider:   Kirk Ruths, MD     Other Instructions Report blood pressure consistently greater than 140/90  Important Information About Sugar

## 2022-08-20 NOTE — Progress Notes (Signed)
Office Visit    Patient Name: Kelly Castillo Date of Encounter: 08/20/2022  Primary Care Provider:  Ivy Lynn, NP Primary Cardiologist:  Kirk Ruths, MD  Chief Complaint    88 year old female with a history of CAD s/p CABG, LBBB, hypertension, hyperlipidemia, mitral valve regurgitation, prior CVA, and type 2 diabetes who presents for follow-up related to CAD and hypertension.   Past Medical History    Past Medical History:  Diagnosis Date   Anemia    GERD (gastroesophageal reflux disease)    HTN (hypertension)    Hypercholesterolemia    LBBB (left bundle branch block)    Lumbar disc disease    Non-ischemic cardiomyopathy (HCC)    Poor short term memory    Type II diabetes mellitus ([place])    Past Surgical History:  Procedure Laterality Date   APPENDECTOMY  1970's   [place]   CARDIAC CATHETERIZATION N/A 08/08/2015   Procedure: Left Heart Cath and Coronary Angiography;  Surgeon: Belva Crome, MD;  Location: [facility];  Service: Cardiovascular;  Laterality: N/A;   CARDIAC CATHETERIZATION  01/31/2014   CATARACT EXTRACTION W/ INTRAOCULAR LENS IMPLANT Right    CATARACT EXTRACTION W/PHACO  07/24/2011   Procedure: CATARACT EXTRACTION PHACO AND INTRAOCULAR LENS PLACEMENT (IOC);  Surgeon: Tonny Branch;  Location: [facility];  Service: Ophthalmology;  Laterality: Left;  CDE: 14.73   CORONARY ARTERY BYPASS GRAFT N/A 08/10/2015   Procedure: OFF PUMP CORONARY ARTERY BYPASS GRAFTING (CABG) UTILIZING THE LEFT INTERNAL MAMMARY ARTERY;  Surgeon: Melrose Nakayama, MD;  Location: [facility];  Service: Open Heart Surgery;  Laterality: N/A;   DILATION AND CURETTAGE OF UTERUS     EYE SURGERY Bilateral    "laser; related to diabetes"   FRACTURE SURGERY     LEFT HEART CATHETERIZATION WITH CORONARY ANGIOGRAM N/A 01/31/2014   Procedure: LEFT HEART CATHETERIZATION WITH CORONARY ANGIOGRAM;  Surgeon: Peter M Martinique, MD;  Location: [facility];  Service: Cardiovascular;  Laterality: N/A;    ORIF ANKLE FRACTURE Right 1980's   TEE WITHOUT CARDIOVERSION N/A 08/10/2015   Procedure: TRANSESOPHAGEAL ECHOCARDIOGRAM (TEE);  Surgeon: Melrose Nakayama, MD;  Location: [place];  Service: Open Heart Surgery;  Laterality: N/A;   TUBAL LIGATION     VAGINAL HYSTERECTOMY  1980's    Allergies  Allergies  Allergen Reactions   Penicillins Hives    History of Present Illness    88 year old female with the above past medical history including CAD s/p CABG, LBBB, hypertension, hyperlipidemia, mitral valve regurgitation, prior CVA, and type 2 diabetes.  Cardiac catheterization in symptomatic 2016 showed 90% ostial LAD and 30% left circumflex stenosis.  She subsequently underwent CABG x1 with LIMA-LAD.  Additionally, she has a history of CVA.  Carotid Dopplers in August 2020 showed no significant stenosis bilaterally.  Cardiac monitor in October 2020 showed sinus rhythm with PACs and PVCs.  Echocardiogram in October 2020 showed normal LV function, G1 DD, mild mitral valve and tricuspid valve regurgitation as well as aortic valve regurgitation.  She was last seen in the office on 01/08/2021 and was stable overall from a cardiac standpoint.  BP was mildly elevated.  Her lisinopril was increased to 40 mg daily.  She presents today for follow-up accompanied by her daughter.  Since her last visit she has been stable from a cardiac standpoint.  She denies any symptoms concerning for angina.  BP has been well controlled.  Overall, she reports feeling well and denies any new concerns today.  Home Medications    Current Outpatient Medications  Medication Sig Dispense Refill   acetaminophen (TYLENOL) 325 MG tablet Take 650 mg by mouth as needed.     aspirin 81 MG tablet Take 1 tablet (81 mg total) by mouth daily.     blood glucose meter kit and supplies May test up to 4 times a day. E11.9 1 each 0   Calcium Carbonate-Vitamin D (CALCIUM-VITAMIN D) 600-125 MG-UNIT TABS Take 1 tablet by mouth 2 (two) times  daily. 90 tablet 6   glimepiride (AMARYL) 4 MG tablet Take 2 tablets (8 mg total) by mouth daily with breakfast. (NEEDS TO BE SEEN BEFORE NEXT REFILL) 60 tablet 0   Iron, Ferrous Sulfate, 325 (65 Fe) MG TABS Take 325 mg by mouth daily. 30 tablet 5   lisinopril (ZESTRIL) 40 MG tablet Take 1 tablet (40 mg total) by mouth daily. 90 tablet 3   metFORMIN (GLUCOPHAGE) 500 MG tablet Take 1 tablet (500 mg total) by mouth daily. 90 tablet 1   metoprolol tartrate (LOPRESSOR) 25 MG tablet TAKE (1) TABLET TWICE A DAY. 180 tablet 3   ONETOUCH VERIO test strip daily.     rosuvastatin (CRESTOR) 20 MG tablet TAKE 1 TABLET DAILY 90 tablet 1   No current facility-administered medications for this visit.     Review of Systems    She denies chest pain, palpitations, dyspnea, pnd, orthopnea, n, v, dizziness, syncope, edema, weight gain, or early satiety. All other systems reviewed and are otherwise negative except as noted above.   Physical Exam    VS:  BP (!) 150/72   Pulse 62   Ht 5' (1.524 m)   Wt 99 lb 9.6 oz (45.2 kg)   BMI 19.45 kg/m  GEN: Well nourished, well developed, in no acute distress. HEENT: normal. Neck: Supple, no JVD, carotid bruits, or masses. Cardiac: RRR, 2/6 murmur, no rubs, or gallops. No clubbing, cyanosis, edema.  Radials/DP/PT 2+ and equal bilaterally.  Respiratory:  Respirations regular and unlabored, clear to auscultation bilaterally. GI: Soft, nontender, nondistended, BS + x 4. MS: no deformity or atrophy. Skin: warm and dry, no rash. Neuro:  Strength and sensation are intact. Psych: Normal affect.  Accessory Clinical Findings    ECG personally reviewed by me today -SR, 62 bpm, LBBB- no acute changes.   Lab Results  Component Value Date   WBC 9.1 04/29/2022   HGB 9.8 (L) 04/29/2022   HCT 30.9 (L) 04/29/2022   MCV 86 04/29/2022   PLT 280 04/29/2022   Lab Results  Component Value Date   CREATININE 1.12 (H) 04/29/2022   BUN 23 04/29/2022   NA 143 04/29/2022   K  4.4 04/29/2022   CL 104 04/29/2022   CO2 24 04/29/2022   Lab Results  Component Value Date   ALT 9 04/29/2022   AST 17 04/29/2022   ALKPHOS 35 (L) 04/29/2022   BILITOT 0.4 04/29/2022   Lab Results  Component Value Date   CHOL 156 04/29/2022   HDL 54 04/29/2022   LDLCALC 82 04/29/2022   TRIG 110 04/29/2022   CHOLHDL 2.9 04/29/2022    Lab Results  Component Value Date   HGBA1C 6.1 (H) 04/29/2022    Assessment & Plan    1. CAD: S/p CABG x1. Stable with no anginal symptoms. No indication for ischemic evaluation.  Continue aspirin, metoprolol, lisinopril, Crestor.  2. Hypertension: BP elevated above goal in office today.  Her daughter reports that her BP is overall well controlled  at home in the 130s over 70s.  She declines titration of BP medication today.  Continue to monitor.  Report BP consistently >140/90.   3. Hyperlipidemia: LDL was 82 in 04/2022.  Slightly above goal.  Monitored and managed per PCP.  Continue aspirin, Crestor.  4. Valvular heart disease: Echo in October 2020 showed normal LV function, G1 DD, mild mitral valve and tricuspid valve regurgitation as well as aortic valve regurgitation.  Asymptomatic, stable.  Consider repeat echocardiogram as clinically indicated.  5. History of CVA: Cardiac monitor in October 2020 showed sinus rhythm with PACs and PVCs.  Echocardiogram in October 2020 showed normal LV function, G1 DD, mild mitral valve and tricuspid valve regurgitation as well as aortic valve regurgitation.  Stable.  No recurrence.  Continue aspirin, Crestor.  6. Type 2 diabetes: A1c was 6.1 in 04/2022.  Monitored and managed per PCP.  7. Disposition: Follow-up in 6 months with Dr. Stanford Breed.   HYPERTENSION CONTROL Vitals:   08/20/22 1320 08/20/22 1344  BP: (!) 180/72 (!) 150/72    The patient's blood pressure is elevated above target today.  In order to address the patient's elevated BP: Blood pressure will be monitored at home to determine if medication  changes need to be made.; Follow up with general cardiology has been recommended.      Lenna Sciara, NP 08/20/2022, 1:49 PM

## 2022-09-15 ENCOUNTER — Other Ambulatory Visit: Payer: Self-pay | Admitting: Nurse Practitioner

## 2022-09-16 ENCOUNTER — Encounter: Payer: Self-pay | Admitting: Nurse Practitioner

## 2022-09-16 NOTE — Telephone Encounter (Signed)
Letter sent.

## 2022-09-16 NOTE — Telephone Encounter (Signed)
Je NTBS 30 days given 08/19/22

## 2022-09-22 ENCOUNTER — Telehealth: Payer: Self-pay | Admitting: Nurse Practitioner

## 2022-09-22 NOTE — Telephone Encounter (Signed)
Patient had appt with Onyeje on 09/23/22 for in office visit, but Onyeje is having to do virtual.  Patient rescheduled appt for 10/07/22.  She is going to need a refill on her glimepiride 4 mg tablet as she only has 4 left.  [place]

## 2022-09-23 ENCOUNTER — Ambulatory Visit (INDEPENDENT_AMBULATORY_CARE_PROVIDER_SITE_OTHER): Payer: Medicare HMO

## 2022-09-23 ENCOUNTER — Telehealth: Payer: Medicare HMO | Admitting: Nurse Practitioner

## 2022-09-23 VITALS — Ht 60.0 in | Wt 95.0 lb

## 2022-09-23 DIAGNOSIS — Z0000 Encounter for general adult medical examination without abnormal findings: Secondary | ICD-10-CM

## 2022-09-23 NOTE — Patient Instructions (Signed)
Kelly Castillo , Thank you for taking time to come for your Medicare Wellness Visit. I appreciate your ongoing commitment to your health goals. Please review the following plan we discussed and let me know if I can assist you in the future.   These are the goals we discussed:  Goals      Exercise 3x per week (30 min per time)     Pt wants to try exercising more.        This is a list of the screening recommended for you and due dates:  Health Maintenance  Topic Date Due   Eye exam for diabetics  Never done   Zoster (Shingles) Vaccine (1 of 2) Never done   COVID-19 Vaccine (5 - Moderna series) 01/17/2022   Pneumonia Vaccine (1 - PCV) 01/22/2023*   Tetanus Vaccine  01/22/2023*   Flu Shot  02/15/2023*   Hemoglobin A1C  10/29/2022   DEXA scan (bone density measurement)  01/16/2023   Complete foot exam   04/30/2023   Medicare Annual Wellness Visit  09/24/2023   HPV Vaccine  Aged Out  *Topic was postponed. The date shown is not the original due date.    Advanced directives: In Chart   Conditions/risks identified: Aim for 30 minutes of exercise or brisk walking, 6-8 glasses of water, and 5 servings of fruits and vegetables each day.   Next appointment: Follow up in one year for your annual wellness visit    Preventive Care 65 Years and Older, Female Preventive care refers to lifestyle choices and visits with your health care provider that can promote health and wellness. What does preventive care include? A yearly physical exam. This is also called an annual well check. Dental exams once or twice a year. Routine eye exams. Ask your health care provider how often you should have your eyes checked. Personal lifestyle choices, including: Daily care of your teeth and gums. Regular physical activity. Eating a healthy diet. Avoiding tobacco and drug use. Limiting alcohol use. Practicing safe sex. Taking low-dose aspirin every day. Taking vitamin and mineral supplements as recommended  by your health care provider. What happens during an annual well check? The services and screenings done by your health care provider during your annual well check will depend on your age, overall health, lifestyle risk factors, and family history of disease. Counseling  Your health care provider may ask you questions about your: Alcohol use. Tobacco use. Drug use. Emotional well-being. Home and relationship well-being. Sexual activity. Eating habits. History of falls. Memory and ability to understand (cognition). Work and work Statistician. Reproductive health. Screening  You may have the following tests or measurements: Height, weight, and BMI. Blood pressure. Lipid and cholesterol levels. These may be checked every 5 years, or more frequently if you are over 9 years old. Skin check. Lung cancer screening. You may have this screening every year starting at age 47 if you have a 30-pack-year history of smoking and currently smoke or have quit within the past 15 years. Fecal occult blood test (FOBT) of the stool. You may have this test every year starting at age 26. Flexible sigmoidoscopy or colonoscopy. You may have a sigmoidoscopy every 5 years or a colonoscopy every 10 years starting at age 47. Hepatitis C blood test. Hepatitis B blood test. Sexually transmitted disease (STD) testing. Diabetes screening. This is done by checking your blood sugar (glucose) after you have not eaten for a while (fasting). You may have this done every 1-3 years. Bone  density scan. This is done to screen for osteoporosis. You may have this done starting at age 56. Mammogram. This may be done every 1-2 years. Talk to your health care provider about how often you should have regular mammograms. Talk with your health care provider about your test results, treatment options, and if necessary, the need for more tests. Vaccines  Your health care provider may recommend certain vaccines, such as: Influenza  vaccine. This is recommended every year. Tetanus, diphtheria, and acellular pertussis (Tdap, Td) vaccine. You may need a Td booster every 10 years. Zoster vaccine. You may need this after age 75. Pneumococcal 13-valent conjugate (PCV13) vaccine. One dose is recommended after age 68. Pneumococcal polysaccharide (PPSV23) vaccine. One dose is recommended after age 72. Talk to your health care provider about which screenings and vaccines you need and how often you need them. This information is not intended to replace advice given to you by your health care provider. Make sure you discuss any questions you have with your health care provider. Document Released: 11/30/2015 Document Revised: 07/23/2016 Document Reviewed: 09/04/2015 Elsevier Interactive Patient Education  2017 [employer] Prevention in the Home Falls can cause injuries. They can happen to people of all ages. There are many things you can do to make your home safe and to help prevent falls. What can I do on the outside of my home? Regularly fix the edges of walkways and driveways and fix any cracks. Remove anything that might make you trip as you walk through a door, such as a raised step or threshold. Trim any bushes or trees on the path to your home. Use bright outdoor lighting. Clear any walking paths of anything that might make someone trip, such as rocks or tools. Regularly check to see if handrails are loose or broken. Make sure that both sides of any steps have handrails. Any raised decks and porches should have guardrails on the edges. Have any leaves, snow, or ice cleared regularly. Use sand or salt on walking paths during winter. Clean up any spills in your garage right away. This includes oil or grease spills. What can I do in the bathroom? Use night lights. Install grab bars by the toilet and in the tub and shower. Do not use towel bars as grab bars. Use non-skid mats or decals in the tub or shower. If you  need to sit down in the shower, use a plastic, non-slip stool. Keep the floor dry. Clean up any water that spills on the floor as soon as it happens. Remove soap buildup in the tub or shower regularly. Attach bath mats securely with double-sided non-slip rug tape. Do not have throw rugs and other things on the floor that can make you trip. What can I do in the bedroom? Use night lights. Make sure that you have a light by your bed that is easy to reach. Do not use any sheets or blankets that are too big for your bed. They should not hang down onto the floor. Have a firm chair that has side arms. You can use this for support while you get dressed. Do not have throw rugs and other things on the floor that can make you trip. What can I do in the kitchen? Clean up any spills right away. Avoid walking on wet floors. Keep items that you use a lot in easy-to-reach places. If you need to reach something above you, use a strong step stool that has a grab bar. Keep  electrical cords out of the way. Do not use floor polish or wax that makes floors slippery. If you must use wax, use non-skid floor wax. Do not have throw rugs and other things on the floor that can make you trip. What can I do with my stairs? Do not leave any items on the stairs. Make sure that there are handrails on both sides of the stairs and use them. Fix handrails that are broken or loose. Make sure that handrails are as long as the stairways. Check any carpeting to make sure that it is firmly attached to the stairs. Fix any carpet that is loose or worn. Avoid having throw rugs at the top or bottom of the stairs. If you do have throw rugs, attach them to the floor with carpet tape. Make sure that you have a light switch at the top of the stairs and the bottom of the stairs. If you do not have them, ask someone to add them for you. What else can I do to help prevent falls? Wear shoes that: Do not have high heels. Have rubber  bottoms. Are comfortable and fit you well. Are closed at the toe. Do not wear sandals. If you use a stepladder: Make sure that it is fully opened. Do not climb a closed stepladder. Make sure that both sides of the stepladder are locked into place. Ask someone to hold it for you, if possible. Clearly mark and make sure that you can see: Any grab bars or handrails. First and last steps. Where the edge of each step is. Use tools that help you move around (mobility aids) if they are needed. These include: Canes. Walkers. Scooters. Crutches. Turn on the lights when you go into a dark area. Replace any light bulbs as soon as they burn out. Set up your furniture so you have a clear path. Avoid moving your furniture around. If any of your floors are uneven, fix them. If there are any pets around you, be aware of where they are. Review your medicines with your doctor. Some medicines can make you feel dizzy. This can increase your chance of falling. Ask your doctor what other things that you can do to help prevent falls. This information is not intended to replace advice given to you by your health care provider. Make sure you discuss any questions you have with your health care provider. Document Released: 08/30/2009 Document Revised: 04/10/2016 Document Reviewed: 12/08/2014 Elsevier Interactive Patient Education  2017 [employer].

## 2022-09-23 NOTE — Progress Notes (Signed)
Subjective:    Subjective:   Kelly Castillo is a 88 y.o. female who presents for Medicare Annual (Subsequent) preventive examination.   I connected with  [name] on 09/23/22 by a audio enabled telemedicine application and verified that I am speaking with the correct person using two identifiers.  Patient Location: Home  Provider Location: Home Office  I discussed the limitations of evaluation and management by telemedicine. The patient expressed understanding and agreed to proceed.  Review of Systems     Cardiac Risk Factors include: advanced age (>51mn, >>8women);diabetes mellitus;hypertension;dyslipidemia     Objective:    Today's Vitals   09/23/22 1543  Weight: 95 lb (43.1 kg)  Height: 5' (1.524 m)   Body mass index is 18.55 kg/m.     09/23/2022    3:48 PM 07/19/2021    1:28 PM 08/08/2015    8:17 AM 07/18/2011    8:51 AM  Advanced Directives  Does Patient Have a Medical Advance Directive? Yes Yes Yes Patient has advance directive, copy not in chart  Type of Advance [place]ive [place] Does patient want to make changes to medical advance directive? No - Patient declined  No - Patient declined   Copy of [place] Chart? Yes - validated most recent copy scanned in chart (See row information)  No - copy requested Copy requested from family  Would patient like information on creating a medical advance directive?   No - patient declined information   Pre-existing out of facility DNR order (yellow form or pink MOST form)    No    Current Medications (verified) Outpatient Encounter Medications as of 09/23/2022  Medication Sig   acetaminophen (TYLENOL) 325 MG tablet Take 650 mg by mouth as needed.   aspirin 81 MG tablet Take 1 tablet (81 mg total) by mouth daily.   blood glucose meter kit and supplies May test up to 4 times a day. E11.9    Calcium Carbonate-Vitamin D (CALCIUM-VITAMIN D) 600-125 MG-UNIT TABS Take 1 tablet by mouth 2 (two) times daily.   glimepiride (AMARYL) 4 MG tablet Take 2 tablets (8 mg total) by mouth daily with breakfast. (NEEDS TO BE SEEN BEFORE NEXT REFILL)   Iron, Ferrous Sulfate, 325 (65 Fe) MG TABS Take 325 mg by mouth daily.   lisinopril (ZESTRIL) 40 MG tablet Take 1 tablet (40 mg total) by mouth daily.   metFORMIN (GLUCOPHAGE) 500 MG tablet Take 1 tablet (500 mg total) by mouth daily.   metoprolol tartrate (LOPRESSOR) 25 MG tablet TAKE (1) TABLET TWICE A DAY.   ONETOUCH VERIO test strip daily.   rosuvastatin (CRESTOR) 20 MG tablet TAKE 1 TABLET DAILY   No facility-administered encounter medications on file as of 09/23/2022.    Allergies (verified) Penicillins   History: Past Medical History:  Diagnosis Date   Anemia    GERD (gastroesophageal reflux disease)    HTN (hypertension)    Hypercholesterolemia    LBBB (left bundle branch block)    Lumbar disc disease    Non-ischemic cardiomyopathy (HCC)    Poor short term memory    Type II diabetes mellitus ([facility]    Past Surgical History:  Procedure Laterality Date   APPEN[place]CTOMY  1970's   [place]   CARDIAC CATHETERIZATION N/A 08/08/2015   Procedure: Left Heart Cath and Coronary Angiography;  Surgeon: HBelva Crome MD;  [facility];  Service: Cardiovascular;  Laterality: N/A;   CARDIAC CATHETERIZATION  01/31/2014   CATARACT EXTRACTION W/ INTRAOCULAR LENS IMPLANT Right    CATARACT EXTRACTION W/PHACO  07/24/2011   Procedure: CATARACT EXTRACTION PHACO AND INTRAOCULAR LENS PLACEMENT (IOC);  Surgeon: Tonny Branch;  Location: [facility];  Service: Ophthalmology;  Laterality: Left;  CDE: 14.73   CORONARY ARTERY BYPASS GRAFT N/A 08/10/2015   Procedure: OFF PUMP CORONARY ARTERY BYPASS GRAFTING (CABG) UTILIZING THE LEFT INTERNAL MAMMARY ARTERY;  Surgeon: Melrose Nakayama, MD;  Location: [place];  Service: Open Heart Surgery;  Laterality:  N/A;   DILATION AND CURETTAGE OF UTERUS     EYE SURGERY Bilateral    "laser; related to diabetes"   FRACTURE SURGERY     LEFT HEART CATHETERIZATION WITH CORONARY ANGIOGRAM N/A 01/31/2014   Procedure: LEFT HEART CATHETERIZATION WITH CORONARY ANGIOGRAM;  Surgeon: Peter M Martinique, MD;  Location: [facility] CATH LAB;  Service: Cardiovascular;  Laterality: N/A;   ORIF ANKLE FRACTURE Right 1980's   TEE WITHOUT CARDIOVERSION N/A 08/10/2015   Procedure: TRANSESOPHAGEAL ECHOCARDIOGRAM (TEE);  Surgeon: Melrose Nakayama, MD;  Location: [place];  Service: Open Heart Surgery;  Laterality: N/A;   TUBAL LIGATION     VAGINAL HYSTERECTOMY  1980's   Family History  Problem Relation Age of Onset   Anesthesia problems Neg Hx    Hypotension Neg Hx    Malignant hyperthermia Neg Hx    Pseudochol deficiency Neg Hx    Social History   Socioeconomic History   Marital status: Widowed    Spouse name: Not on file   Number of children: 10   Years of education: Not on file   Highest education level: Not on file  Occupational History   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  Tobacco Use   Smoking status: Never   Smokeless tobacco: Never  Substance and Sexual Activity   Alcohol use: No    Alcohol/week: 0.0 standard drinks of alcohol   Drug use: No   Sexual activity: Yes    Birth control/protection: Surgical  Other Topics Concern   Not on file  Social History Narrative   9 children living and 1 deceased.   Lives with youngest Son. Daughter, Kennyth Lose, comes during the day to help patient with ADLs and stays until her brother gets home.    Social Determinants of Health   Financial Resource Strain: Low Risk  (09/23/2022)   Overall Financial Resource Strain (CARDIA)    Difficulty of Paying Living Expenses: Not hard at all  Food Insecurity: No Food Insecurity (09/23/2022)   Hunger Vital Sign    Worried About Running Out of Food in the Last Year: Never true    Ran Out of Food in  the Last Year: Never true  Transportation Needs: No Transportation Needs (09/23/2022)   PRAPARE - Hydrologist (Medical): No    Lack of Transportation (Non-Medical): No  Physical Activity: Insufficiently Active (09/23/2022)   Exercise Vital Sign    Days of Exercise per Week: 2 days    Minutes of Exercise per Session: 20 min  Stress: No Stress Concern Present (09/23/2022)   [place]    Feeling of Stress : Not at all  Social Connections: Moderately Isolated (09/23/2022)   Social Connection and Isolation Panel [NHANES]    Frequency of Communication with Friends and Family: More than three times a week    Frequency of  Social Gatherings with Friends and Family: More than three times a week    Attends Religious Services: 1 to 4 times per year    Active Member of [employer] or Organizations: No    Attends Archivist Meetings: Never    Marital Status: Widowed    Tobacco Counseling Counseling given: Not Answered   Clinical Intake:  Pre-visit preparation completed: Yes  Pain : No/denies pain     Nutritional Risks: None Diabetes: No  How often do you need to have someone help you when you read instructions, pamphlets, or other written materials from your doctor or pharmacy?: 1 - Never  Diabetic?yes Nutrition Risk Assessment:  Has the patient had any N/V/D within the last 2 months?  No  Does the patient have any non-healing wounds?  No  Has the patient had any unintentional weight loss or weight gain?  No   Diabetes:  Is the patient diabetic?  Yes  If diabetic, was a CBG obtained today?  No  Did the patient bring in their glucometer from home?  No  How often do you monitor your CBG's? Daily .   Financial Strains and Diabetes Management:  Are you having any financial strains with the device, your supplies or your medication? No .  Does the patient want to be seen by Chronic Care  Management for management of their diabetes?  No  Would the patient like to be referred to a Nutritionist or for Diabetic Management?  No   Diabetic Exams:  Diabetic Eye Exam: Overdue for diabetic eye exam. Pt has been advised about the importance in completing this exam. Patient advised to call and schedule an eye exam. Diabetic Foot Exam: Overdue, Pt has been advised about the importance in completing this exam. Pt is scheduled for diabetic foot exam on next office visit .   Interpreter Needed?: No  Information entered by :: Jadene Pierini, LPN   Activities of Daily Living    09/23/2022    3:48 PM  In your present state of health, do you have any difficulty performing the following activities:  Hearing? 1  Vision? 1  Difficulty concentrating or making decisions? 1  Walking or climbing stairs? 1  Dressing or bathing? 1  Doing errands, shopping? 1  Preparing Food and eating ? Y  Using the Toilet? Y  In the past six months, have you accidently leaked urine? Y  Do you have problems with loss of bowel control? Y  Managing your Medications? Y  Managing your Finances? Y  Housekeeping or managing your Housekeeping? Y    Patient Care Team: Ivy Lynn, NP as PCP - General (Nurse Practitioner) Lelon Perla, MD as PCP - Cardiology (Cardiology) Stanford Breed Denice Bors, MD as Consulting Physician (Cardiology)  Indicate any recent Medical Services you may have received from other than [facility] providers in the past year (date may be approximate).     Assessment:   This is a routine wellness examination for [facility].  Hearing/Vision screen Vision Screening - Comments:: Due eye exam Daughter to schedule   Dietary issues and exercise activities discussed: Current Exercise Habits: The patient does not participate in regular exercise at present, Exercise limited by: orthopedic condition(s)   Goals Addressed             This Visit's Progress    Exercise 3x per week (30 min per  time)   On track    Pt wants to try exercising more.  Depression Screen    09/23/2022    3:46 PM 04/29/2022    9:13 AM 01/21/2022    9:45 AM 10/23/2021    8:07 AM 07/19/2021    1:24 PM 07/17/2021    8:55 AM 04/19/2021   10:52 AM  PHQ 2/9 Scores  PHQ - 2 Score 0 0 0 0 0 0 0  PHQ- 9 Score   0 0   0    Fall Risk    09/23/2022    3:45 PM 04/29/2022    9:01 AM 01/21/2022    9:45 AM 10/23/2021    8:07 AM 07/19/2021    1:29 PM  [place] in the past year? 0 0 0 0 0  Number falls in past yr: 0   0 0  Injury with Fall? 0   0 0  Risk for fall due to : No Fall Risks   No Fall Risks Impaired mobility;Impaired vision;Orthopedic patient  Follow up Falls prevention discussed   Falls evaluation completed Falls prevention discussed    FALL RISK PREVENTION PERTAINING TO THE HOME:  Any stairs in or around the home? No  If so, are there any without handrails? No  Home free of loose throw rugs in walkways, pet beds, electrical cords, etc? Yes  Adequate lighting in your home to reduce risk of falls? Yes   ASSISTIVE DEVICES UTILIZED TO PREVENT FALLS:  Life alert? No  Use of a cane, walker or w/c? Yes  Grab bars in the bathroom? Yes  Shower chair or bench in shower? Yes  Elevated toilet seat or a handicapped toilet? Yes          09/23/2022    3:48 PM 07/19/2021    1:31 PM  6CIT Screen  What Year? 0 points 4 points  What month? 0 points 0 points  What time? 0 points 0 points  Count back from 20 0 points 0 points  Months in reverse 2 points 0 points  Repeat phrase 2 points 8 points  Total Score 4 points 12 points    Immunizations Immunization History  Administered Date(s) Administered   Moderna Covid Bivalent Peds Booster(41moThru 558yr 09/19/2021   Moderna Sars-Covid-2 Vaccination 04/19/2020, 05/17/2020, 12/14/2020    TDAP status: Due, Education has been provided regarding the importance of this vaccine. Advised may receive this vaccine at local pharmacy or Health Dept.  Aware to provide a copy of the vaccination record if obtained from local pharmacy or Health Dept. Verbalized acceptance and understanding.  Flu Vaccine status: Declined, Education has been provided regarding the importance of this vaccine but patient still declined. Advised may receive this vaccine at local pharmacy or Health Dept. Aware to provide a copy of the vaccination record if obtained from local pharmacy or Health Dept. Verbalized acceptance and understanding.  Pneumococcal vaccine status: Due, Education has been provided regarding the importance of this vaccine. Advised may receive this vaccine at local pharmacy or Health Dept. Aware to provide a copy of the vaccination record if obtained from local pharmacy or Health Dept. Verbalized acceptance and understanding.  Covid-19 vaccine status: Completed vaccines  Qualifies for Shingles Vaccine? Yes   Zostavax completed No   Shingrix Completed?: No.    Education has been provided regarding the importance of this vaccine. Patient has been advised to call insurance company to determine out of pocket expense if they have not yet received this vaccine. Advised may also receive vaccine at local pharmacy or Health Dept. Verbalized acceptance  and understanding.  Screening Tests Health Maintenance  Topic Date Due   OPHTHALMOLOGY EXAM  Never done   Zoster Vaccines- Shingrix (1 of 2) Never done   COVID-19 Vaccine (5 - Moderna series) 01/17/2022   Pneumonia Vaccine 68+ Years old (1 - PCV) 01/22/2023 (Originally 12/27/2000)   TETANUS/TDAP  01/22/2023 (Originally 12/27/1952)   INFLUENZA VACCINE  02/15/2023 (Originally 06/17/2022)   HEMOGLOBIN A1C  10/29/2022   DEXA SCAN  01/16/2023   FOOT EXAM  04/30/2023   Medicare Annual Wellness (AWV)  09/24/2023   HPV VACCINES  Aged Out    Health Maintenance  Health Maintenance Due  Topic Date Due   OPHTHALMOLOGY EXAM  Never done   Zoster Vaccines- Shingrix (1 of 2) Never done   COVID-19 Vaccine (5 - Moderna  series) 01/17/2022    Colorectal cancer screening: No longer required.   Mammogram status: No longer required due to age.  Bone Density status: Completed 01/15/2021. Results reflect: Bone density results: OSTEOPENIA. Repeat every 5 years.  Lung Cancer Screening: (Low Dose CT Chest recommended if Age 21-80 years, 30 pack-year currently smoking OR have quit w/in 15years.) does not qualify.   Lung Cancer Screening Referral: n/a  Additional Screening:  Hepatitis C Screening: does not qualify;  Vision Screening: Recommended annual ophthalmology exams for early detection of glaucoma and other disorders of the eye. Is the patient up to date with their annual eye exam?  No  Who is the provider or what is the name of the office in which the patient attends annual eye exams? Due daughter Kennyth Lose to schedule  If pt is not established with a provider, would they like to be referred to a provider to establish care? No .   Dental Screening: Recommended annual dental exams for proper oral hygiene  Community Resource Referral / Chronic Care Management: CRR required this visit?  No   CCM required this visit?  No      Plan:     I have personally reviewed and noted the following in the patient's chart:   Medical and social history Use of alcohol, tobacco or illicit drugs  Current medications and supplements including opioid prescriptions. Patient is not currently taking opioid prescriptions. Functional ability and status Nutritional status Physical activity Advanced directives List of other physicians Hospitalizations, surgeries, and ER visits in previous 12 months Vitals Screenings to include cognitive, depression, and falls Referrals and appointments  In addition, I have reviewed and discussed with patient certain preventive protocols, quality metrics, and best practice recommendations. A written personalized care plan for preventive services as well as general preventive health  recommendations were provided to patient.     Daphane Shepherd, LPN   [id number]   Nurse Notes: Due Eye exam , Declined vaccines

## 2022-09-24 ENCOUNTER — Other Ambulatory Visit: Payer: Self-pay | Admitting: Nurse Practitioner

## 2022-09-24 DIAGNOSIS — E119 Type 2 diabetes mellitus without complications: Secondary | ICD-10-CM

## 2022-09-24 MED ORDER — GLIMEPIRIDE 4 MG PO TABS: 8.0000 mg | ORAL_TABLET | Freq: Every day | ORAL | 1 refills | Status: DC

## 2022-09-24 NOTE — Telephone Encounter (Signed)
Refill sent to pharmacy.   

## 2022-10-01 ENCOUNTER — Other Ambulatory Visit: Payer: Self-pay | Admitting: Cardiology

## 2022-10-07 ENCOUNTER — Encounter: Payer: Self-pay | Admitting: Nurse Practitioner

## 2022-10-07 ENCOUNTER — Ambulatory Visit (INDEPENDENT_AMBULATORY_CARE_PROVIDER_SITE_OTHER): Payer: Medicare HMO | Admitting: Nurse Practitioner

## 2022-10-07 VITALS — BP 158/70 | Temp 98.7°F | Ht 60.0 in | Wt 94.6 lb

## 2022-10-07 DIAGNOSIS — K219 Gastro-esophageal reflux disease without esophagitis: Secondary | ICD-10-CM | POA: Diagnosis not present

## 2022-10-07 DIAGNOSIS — E1169 Type 2 diabetes mellitus with other specified complication: Secondary | ICD-10-CM

## 2022-10-07 DIAGNOSIS — I10 Essential (primary) hypertension: Secondary | ICD-10-CM | POA: Diagnosis not present

## 2022-10-07 DIAGNOSIS — E785 Hyperlipidemia, unspecified: Secondary | ICD-10-CM

## 2022-10-07 DIAGNOSIS — E119 Type 2 diabetes mellitus without complications: Secondary | ICD-10-CM

## 2022-10-07 LAB — BAYER DCA HB A1C WAIVED: HB A1C (BAYER DCA - WAIVED): 5.6 % (ref 4.8–5.6)

## 2022-10-07 MED ORDER — ONETOUCH VERIO VI STRP: 1.0000 | ORAL_STRIP | Freq: Every day | 1 refills | Status: DC

## 2022-10-07 MED ORDER — METFORMIN HCL 500 MG PO TABS: 500.0000 mg | ORAL_TABLET | Freq: Every day | ORAL | 1 refills | Status: DC

## 2022-10-07 MED ORDER — BLOOD GLUCOSE METER KIT: PACK | 0 refills | Status: AC

## 2022-10-07 MED ORDER — LISINOPRIL 40 MG PO TABS: 40.0000 mg | ORAL_TABLET | Freq: Every day | ORAL | 3 refills | Status: DC

## 2022-10-07 MED ORDER — METOPROLOL TARTRATE 25 MG PO TABS: ORAL_TABLET | ORAL | 3 refills | Status: DC

## 2022-10-07 MED ORDER — GLIMEPIRIDE 4 MG PO TABS: 8.0000 mg | ORAL_TABLET | Freq: Every day | ORAL | 1 refills | Status: DC

## 2022-10-07 MED ORDER — ROSUVASTATIN CALCIUM 20 MG PO TABS: 20.0000 mg | ORAL_TABLET | Freq: Every day | ORAL | 3 refills | Status: DC

## 2022-10-07 NOTE — Assessment & Plan Note (Signed)
Patient well-controlled on current medication regimen no changes necessary.  Labs completed lipid panel.  Follow-up in 3 to 6 months.

## 2022-10-07 NOTE — Patient Instructions (Signed)
Diabetes Insipidus Diabetes insipidus (DI) is a rare condition that causes the body to produce more urine than normal. This leads to thirst and low fluid in the body (dehydration). In this condition, the urine is made mostly of water, or dilute urine. DI affects mostly adults, but it can happen at any age. There are four types of DI: Central DI. This is the most common type. Dipsogenic DI. Nephrogenic DI. Gestational DI. The most common forms of this condition are caused by a decrease in the production of the hormone that regulates urine output (antidiuretic hormone), or the body's resistance to this hormone. This condition is not related to type 1 or type 2 diabetes mellitus. What are the causes? Central DI is caused by damage to the pituitary gland or hypothalamus in the brain. Dipsogenic DI is caused by a defect in the thirst mechanism in the brain. This defect causes you to drink too much fluid. These may result from: Brain surgery. Infection. Inflammation. Brain tumor. Head injury. Nephrogenic DI is caused by the kidneys not responding to the antidiuretic hormone in the body. This may result from: Chronic kidney disease (CKD). Certain medicines, such as lithium. Low potassium levels. High calcium levels. Gestational DI is rare and is caused by the antidiuretic hormone that has stopped working properly. What are the signs or symptoms? Symptoms of this condition include: Excessive urination. This means urinating more than 10 cups (2.4 L) during a period of 24 hours. Excessive thirst. Too much nighttime urination (nocturia). Nausea. Diarrhea. How is this diagnosed? This condition may be diagnosed based on: Your medical history. A physical exam. Blood tests. Urine tests. A water deprivation test. During this test, you will stop drinking fluids for a period of time and your blood and urine will be checked regularly. An MRI. How is this treated? Once your specific type of  diabetes insipidus is diagnosed, treatment may include one or more of the following: Increasing or limiting your fluid intake. Taking medicines that contain artificial (synthetic) versions of the antidiuretic hormone. Stopping certain medicines that you take. Correcting the balance of minerals (electrolytes) in your body. Changing your diet. You may be put on a low-protein and low-sodium diet. You may need to visit your health care provider regularly to make sure your condition is being treated properly. You may also need to work with providers who specialize in: Kidney problems (nephrologist). Hormone disorders (endocrinologist). Follow these instructions at home:  Eating and drinking Follow instructions from your health care provider about how much fluid and water to drink. You may be directed to drink more fluids and water, or to limit how much fluid and water you drink. Follow instructions from your health care provider about eating or drinking restrictions. General instructions Take over-the-counter and prescription medicines only as told by your health care provider. If directed, monitor your risk of dehydration in extreme heat. Carry a medical alert card or wear medical alert jewelry. Keep all follow-up visits as told by your health care provider. This is important. You may need to visit your health care provider regularly to make sure your condition is being treated properly. Contact a health care provider if: You continue to have symptoms after treatment. Get help right away if: You have extreme thirst. You have symptoms of severe dehydration, such as rapid heart rate, muscle cramps, or confusion. Summary Diabetes insipidus (DI) is a rare condition that causes the body to produce more urine than normal, which leads to thirst and dehydration. Follow instructions   from your health care provider about eating or drinking restrictions. Treatment may include increasing or limiting your  fluid intake and correcting the balance of minerals (electrolytes) in your body. Get help right away if you have symptoms of severe dehydration, such as rapid heart rate, muscle cramps, or confusion. This information is not intended to replace advice given to you by your health care provider. Make sure you discuss any questions you have with your health care provider. Document Revised: 04/08/2022 Document Reviewed: 12/07/2019 Elsevier Patient Education  2023 Elsevier Inc.  

## 2022-10-07 NOTE — Assessment & Plan Note (Signed)
No new or worsening symptoms.

## 2022-10-07 NOTE — Assessment & Plan Note (Signed)
Hypertension well-controlled on current medication no changes necessary.  Patient reports her blood pressure is well controlled at home and elevated in clinic.  Patient knows to bring her blood pressure machine to be calibrated.  Patient is managed by Dr. Jens Som.  Follow-up in 3 to 6 months.  Labs completed today-CBC, CMP, lipid panel

## 2022-10-07 NOTE — Progress Notes (Signed)
Established Patient Office Visit  Subjective   Patient ID: Kelly Castillo, female    DOB: Dec 25, 1933  Age: 88 y.o. MRN: [id number]  Chief Complaint  Patient presents with   Medical Management of Chronic Issues    3 month     HPI  Pt presents for follow up of hypertension. Patient was diagnosed in 12/13/2009. The patient is tolerating the medication well without side effects. Compliance with treatment has been good; including taking medication as directed , maintains a healthy diet and regular exercise regimen , and following up as directed.    Mixed hyperlipidemia  Pt presents with hyperlipidemia. Patient was diagnosed in 04/19/2021.  Compliance with treatment has been good; The patient is compliant with medications, maintains a low cholesterol diet , follows up as directed , and maintains an exercise regimen . The patient denies experiencing any hypercholesterolemia related symptoms.     The patient presents with history of type II diabetes mellitus without complications. Patient was diagnosed in 12/13/2009. Compliance with treatment has been good; the patient takes medication as directed , maintains a diabetic diet and an exercise regimen , follows up as directed , and is keeping a glucose diary. Sugars runs fasting blood sugar 90. Patient specifically denies associated symptoms, including blurred vision, fatigue, polydipsia, polyphagia and polyuria . Patient denies hypoglycemia. In regard to preventative care, the patient performs foot self-exams daily and last ophthalmology exam was in 2023. Marland Kitchen  Patient Active Problem List   Diagnosis Date Noted   Hyperlipidemia associated with type 2 diabetes mellitus ([place]) 04/19/2021   Low bone density 02/10/2021   Annual physical exam 01/15/2021   CAD (coronary artery disease), native coronary artery 09/25/2015   S/P CABG x 1 08/10/2015   Angina pectoris, crescendo ([place]) 08/08/2015   Abnormal nuclear stress test    Diabetes mellitus type 2 in  nonobese ([place]) 12/13/2009   MITRAL REGURGITATION 12/13/2009   Essential hypertension 12/13/2009   CARDIOMYOPATHY 12/13/2009   GERD 12/13/2009   Past Medical History:  Diagnosis Date   Anemia    GERD (gastroesophageal reflux disease)    HTN (hypertension)    Hypercholesterolemia    LBBB (left bundle branch block)    Lumbar disc disease    Non-ischemic cardiomyopathy ([place])    Poor short term memory    Type II diabetes mellitus ([place])    Past Surgical History:  Procedure Laterality Date   APPENDECTOMY  1970's   [place]   CARDIAC CATHETERIZATION N/A 08/08/2015   Procedure: Left Heart Cath and Coronary Angiography;  Surgeon: Belva Crome, MD;  Location: [facility];  Service: Cardiovascular;  Laterality: N/A;   CARDIAC CATHETERIZATION  01/31/2014   CATARACT EXTRACTION W/ INTRAOCULAR LENS IMPLANT Right    CATARACT EXTRACTION W/PHACO  07/24/2011   Procedure: CATARACT EXTRACTION PHACO AND INTRAOCULAR LENS PLACEMENT (IOC);  Surgeon: Tonny Branch;  Location: [facility];  Service: Ophthalmology;  Laterality: Left;  CDE: 14.73   CORONARY ARTERY BYPASS GRAFT N/A 08/10/2015   Procedure: OFF PUMP CORONARY ARTERY BYPASS GRAFTING (CABG) UTILIZING THE LEFT INTERNAL MAMMARY ARTERY;  Surgeon: Melrose Nakayama, MD;  Location: [facility];  Service: Open Heart Surgery;  Laterality: N/A;   DILATION AND CURETTAGE OF UTERUS     EYE SURGERY Bilateral    "laser; related to diabetes"   FRACTURE SURGERY     LEFT HEART CATHETERIZATION WITH CORONARY ANGIOGRAM N/A 01/31/2014   Procedure: LEFT HEART CATHETERIZATION WITH CORONARY ANGIOGRAM;  Surgeon: Peter M Martinique, MD;  Location: [facility];  Service: Cardiovascular;  Laterality: N/A;   ORIF ANKLE FRACTURE Right 1980's   TEE WITHOUT CARDIOVERSION N/A 08/10/2015   Procedure: TRANSESOPHAGEAL ECHOCARDIOGRAM (TEE);  Surgeon: Melrose Nakayama, MD;  Location: [facility];  Service: Open Heart Surgery;  Laterality: N/A;   TUBAL LIGATION     VAGINAL HYSTERECTOMY  1980's    Social History   Tobacco Use   Smoking status: Never   Smokeless tobacco: Never  Substance Use Topics   Alcohol use: No    Alcohol/week: 0.0 standard drinks of alcohol   Drug use: No   Social History   Socioeconomic History   Marital status: Widowed    Spouse name: Not on file   Number of children: 10   Years of education: Not on file   Highest education level: Not on file  Occupational History   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  Tobacco Use   Smoking status: Never   Smokeless tobacco: Never  Substance and Sexual Activity   Alcohol use: No    Alcohol/week: 0.0 standard drinks of alcohol   Drug use: No   Sexual activity: Yes    Birth control/protection: Surgical  Other Topics Concern   Not on file  Social History Narrative   9 children living and 1 deceased.   Lives with youngest Son. Daughter, Kennyth Lose, comes during the day to help patient with ADLs and stays until her brother gets home.    Social Determinants of Health   Financial Resource Strain: Low Risk  (09/23/2022)   Overall Financial Resource Strain (CARDIA)    Difficulty of Paying Living Expenses: Not hard at all  Food Insecurity: No Food Insecurity (09/23/2022)   Hunger Vital Sign    Worried About Running Out of Food in the Last Year: Never true    Ran Out of Food in the Last Year: Never true  Transportation Needs: No Transportation Needs (09/23/2022)   PRAPARE - Hydrologist (Medical): No    Lack of Transportation (Non-Medical): No  Physical Activity: Insufficiently Active (09/23/2022)   Exercise Vital Sign    Days of Exercise per Week: 2 days    Minutes of Exercise per Session: 20 min  Stress: No Stress Concern Present (09/23/2022)   [place]    Feeling of Stress : Not at all  Social Connections: Moderately Isolated (09/23/2022)   Social Connection and Isolation Panel  [NHANES]    Frequency of Communication with Friends and Family: More than three times a week    Frequency of Social Gatherings with Friends and Family: More than three times a week    Attends Religious Services: 1 to 4 times per year    Active Member of [employer] or Organizations: No    Attends Archivist Meetings: Never    Marital Status: Widowed  Intimate Partner Violence: Not At Risk (09/23/2022)   Humiliation, Afraid, Rape, and Kick questionnaire    Fear of Current or Ex-Partner: No    Emotionally Abused: No    Physically Abused: No    Sexually Abused: No   Family Status  Relation Name Status   Mother  Deceased   Father  Deceased   Neg Hx  (Not Specified)   Family History  Problem Relation Age of Onset   Anesthesia problems Neg Hx    Hypotension Neg Hx    Malignant hyperthermia Neg Hx  Pseudochol deficiency Neg Hx    Allergies  Allergen Reactions   Penicillins Hives      Review of Systems  Constitutional: Negative.  Negative for chills and fever.  HENT: Negative.    Eyes: Negative.   Respiratory: Negative.    Cardiovascular: Negative.   Genitourinary: Negative.   Musculoskeletal:        Patient uses a front wheel walker  Skin: Negative.  Negative for itching and rash.  Neurological: Negative.   Psychiatric/Behavioral: Negative.    All other systems reviewed and are negative.     Objective:     BP (!) 158/70   Temp 98.7 F (37.1 C)   Ht 5' (1.524 m)   Wt 94 lb 9.6 oz (42.9 kg)   BMI 18.48 kg/m  BP Readings from Last 3 Encounters:  10/07/22 (!) 158/70  08/20/22 (!) 150/72  04/29/22 (!) 168/66   Wt Readings from Last 3 Encounters:  10/07/22 94 lb 9.6 oz (42.9 kg)  09/23/22 95 lb (43.1 kg)  08/20/22 99 lb 9.6 oz (45.2 kg)      Physical Exam Vitals and nursing note reviewed.  Constitutional:      Appearance: Normal appearance.  HENT:     Right Ear: External ear normal.     Left Ear: External ear normal.     Nose: Nose normal.  Eyes:      Conjunctiva/sclera: Conjunctivae normal.  Cardiovascular:     Rate and Rhythm: Normal rate and regular rhythm.     Pulses: Normal pulses.     Heart sounds: Normal heart sounds.  Pulmonary:     Effort: Pulmonary effort is normal.     Breath sounds: Normal breath sounds.  Abdominal:     General: Bowel sounds are normal.  Musculoskeletal:     Comments: Front wheel walker  Skin:    General: Skin is warm.     Findings: No erythema or rash.  Neurological:     General: No focal deficit present.     Mental Status: She is alert and oriented to person, place, and time.  Psychiatric:        Behavior: Behavior normal.      No results found for any visits on 10/07/22.  Last CBC Lab Results  Component Value Date   WBC 9.1 04/29/2022   HGB 9.8 (L) 04/29/2022   HCT 30.9 (L) 04/29/2022   MCV 86 04/29/2022   MCH 27.4 04/29/2022   RDW 16.2 (H) 04/29/2022   PLT 280 69/45/0388   Last metabolic panel Lab Results  Component Value Date   GLUCOSE 142 (H) 04/29/2022   NA 143 04/29/2022   K 4.4 04/29/2022   CL 104 04/29/2022   CO2 24 04/29/2022   BUN 23 04/29/2022   CREATININE 1.12 (H) 04/29/2022   EGFR 48 (L) 04/29/2022   CALCIUM 9.9 04/29/2022   PROT 7.0 04/29/2022   ALBUMIN 4.4 04/29/2022   LABGLOB 2.6 04/29/2022   AGRATIO 1.7 04/29/2022   BILITOT 0.4 04/29/2022   ALKPHOS 35 (L) 04/29/2022   AST 17 04/29/2022   ALT 9 04/29/2022   ANIONGAP 4 (L) 08/13/2015   Last lipids Lab Results  Component Value Date   CHOL 156 04/29/2022   HDL 54 04/29/2022   LDLCALC 82 04/29/2022   TRIG 110 04/29/2022   CHOLHDL 2.9 04/29/2022   Last hemoglobin A1c Lab Results  Component Value Date   HGBA1C 6.1 (H) 04/29/2022   Last thyroid functions No results found for: "TSH", "T3TOTAL", "T4TOTAL", "  THYROIDAB" Last vitamin D No results found for: "25OHVITD2", "25OHVITD3", "VD25OH" Last vitamin B12 and Folate No results found for: "VITAMINB12", "FOLATE"    The ASCVD Risk score (Arnett  DK, et al., 2019) failed to calculate for the following reasons:   The 2019 ASCVD risk score is only valid for ages 63 to 43    Assessment & Plan:   Problem List Items Addressed This Visit       Cardiovascular and Mediastinum   Essential hypertension    Hypertension well-controlled on current medication no changes necessary.  Patient reports her blood pressure is well controlled at home and elevated in clinic.  Patient knows to bring her blood pressure machine to be calibrated.  Patient is managed by Dr. Stanford Breed.  Follow-up in 3 to 6 months.  Labs completed today-CBC, CMP, lipid panel      Relevant Medications   lisinopril (ZESTRIL) 40 MG tablet   metoprolol tartrate (LOPRESSOR) 25 MG tablet   rosuvastatin (CRESTOR) 20 MG tablet     Digestive   GERD - Primary    No new or worsening symptoms.        Endocrine   Diabetes mellitus type 2 in nonobese (HCC)    No changes to current medication symptoms are well controlled.      Relevant Medications   glimepiride (AMARYL) 4 MG tablet   lisinopril (ZESTRIL) 40 MG tablet   metFORMIN (GLUCOPHAGE) 500 MG tablet   metoprolol tartrate (LOPRESSOR) 25 MG tablet   rosuvastatin (CRESTOR) 20 MG tablet   ONETOUCH VERIO test strip   blood glucose meter kit and supplies   Other Relevant Orders   Bayer DCA Hb A1c Waived   CBC with Differential/Platelet   CMP14+EGFR   Lipid panel   Hyperlipidemia associated with type 2 diabetes mellitus ([place])    Patient well-controlled on current medication regimen no changes necessary.  Labs completed lipid panel.  Follow-up in 3 to 6 months.      Relevant Medications   glimepiride (AMARYL) 4 MG tablet   lisinopril (ZESTRIL) 40 MG tablet   metFORMIN (GLUCOPHAGE) 500 MG tablet   metoprolol tartrate (LOPRESSOR) 25 MG tablet   rosuvastatin (CRESTOR) 20 MG tablet    Return in about 3 months (around 01/07/2023) for chronic disease management.    Ivy Lynn, NP

## 2022-10-07 NOTE — Assessment & Plan Note (Signed)
No changes to current medication symptoms are well controlled.

## 2022-10-08 ENCOUNTER — Other Ambulatory Visit: Payer: Self-pay | Admitting: Nurse Practitioner

## 2022-10-08 DIAGNOSIS — E781 Pure hyperglyceridemia: Secondary | ICD-10-CM

## 2022-10-08 LAB — CBC WITH DIFFERENTIAL/PLATELET
Basophils Absolute: 0 10*3/uL (ref 0.0–0.2)
Basos: 1 %
EOS (ABSOLUTE): 0.1 10*3/uL (ref 0.0–0.4)
Eos: 3 %
Hematocrit: 29.9 % — ABNORMAL LOW (ref 34.0–46.6)
Hemoglobin: 9.7 g/dL — ABNORMAL LOW (ref 11.1–15.9)
Immature Grans (Abs): 0 10*3/uL (ref 0.0–0.1)
Immature Granulocytes: 0 %
Lymphocytes Absolute: 1.1 10*3/uL (ref 0.7–3.1)
Lymphs: 20 %
MCH: 27.5 pg (ref 26.6–33.0)
MCHC: 32.4 g/dL (ref 31.5–35.7)
MCV: 85 fL (ref 79–97)
Monocytes Absolute: 0.3 10*3/uL (ref 0.1–0.9)
Monocytes: 5 %
Neutrophils Absolute: 4 10*3/uL (ref 1.4–7.0)
Neutrophils: 71 %
Platelets: 294 10*3/uL (ref 150–450)
RBC: 3.53 x10E6/uL — ABNORMAL LOW (ref 3.77–5.28)
RDW: 15.3 % (ref 11.7–15.4)
WBC: 5.5 10*3/uL (ref 3.4–10.8)

## 2022-10-08 LAB — CMP14+EGFR
ALT: 12 IU/L (ref 0–32)
AST: 20 IU/L (ref 0–40)
Albumin/Globulin Ratio: 1.9 (ref 1.2–2.2)
Albumin: 4.6 g/dL (ref 3.7–4.7)
Alkaline Phosphatase: 36 IU/L — ABNORMAL LOW (ref 44–121)
BUN/Creatinine Ratio: 12 (ref 12–28)
BUN: 26 mg/dL (ref 8–27)
Bilirubin Total: 0.3 mg/dL (ref 0.0–1.2)
CO2: 20 mmol/L (ref 20–29)
Calcium: 10.5 mg/dL — ABNORMAL HIGH (ref 8.7–10.3)
Chloride: 105 mmol/L (ref 96–106)
Creatinine, Ser: 2.12 mg/dL — ABNORMAL HIGH (ref 0.57–1.00)
Globulin, Total: 2.4 g/dL (ref 1.5–4.5)
Glucose: 79 mg/dL (ref 70–99)
Potassium: 3.8 mmol/L (ref 3.5–5.2)
Sodium: 143 mmol/L (ref 134–144)
Total Protein: 7 g/dL (ref 6.0–8.5)
eGFR: 22 mL/min/{1.73_m2} — ABNORMAL LOW (ref >59)

## 2022-10-08 LAB — LIPID PANEL
Chol/HDL Ratio: 4.3 ratio (ref 0.0–4.4)
Cholesterol, Total: 197 mg/dL (ref 100–199)
HDL: 46 mg/dL (ref >39)
LDL Chol Calc (NIH): 119 mg/dL — ABNORMAL HIGH (ref 0–99)
Triglycerides: 182 mg/dL — ABNORMAL HIGH (ref 0–149)
VLDL Cholesterol Cal: 32 mg/dL (ref 5–40)

## 2022-10-08 LAB — VITAMIN B12: Vitamin B-12: >2000 pg/mL — ABNORMAL HIGH (ref 232–1245)

## 2022-10-08 MED ORDER — OMEGA-3 FATTY ACIDS 1000 MG PO CAPS: 2.0000 g | ORAL_CAPSULE | Freq: Every day | ORAL | 3 refills | Status: DC

## 2022-10-16 ENCOUNTER — Ambulatory Visit: Payer: Medicare HMO | Admitting: Emergency Medicine

## 2022-11-25 ENCOUNTER — Other Ambulatory Visit: Payer: Self-pay | Admitting: Nurse Practitioner

## 2022-11-25 DIAGNOSIS — I10 Essential (primary) hypertension: Secondary | ICD-10-CM

## 2022-11-26 ENCOUNTER — Encounter: Payer: Self-pay | Admitting: *Deleted

## 2023-01-02 ENCOUNTER — Inpatient Hospital Stay (HOSPITAL_COMMUNITY): Payer: Medicare HMO

## 2023-01-02 ENCOUNTER — Other Ambulatory Visit: Payer: Self-pay

## 2023-01-02 ENCOUNTER — Emergency Department (HOSPITAL_COMMUNITY): Payer: Medicare HMO

## 2023-01-02 ENCOUNTER — Inpatient Hospital Stay (HOSPITAL_COMMUNITY)
Admission: EM | Admit: 2023-01-02 | Discharge: 2023-01-06 | DRG: 683 | Disposition: A | Discharge status: Home / Self Care | Payer: Medicare HMO | Attending: Internal Medicine | Admitting: Internal Medicine

## 2023-01-02 ENCOUNTER — Encounter (HOSPITAL_COMMUNITY): Payer: Self-pay

## 2023-01-02 DIAGNOSIS — R69 Illness, unspecified: Secondary | ICD-10-CM | POA: Diagnosis not present

## 2023-01-02 DIAGNOSIS — Z951 Presence of aortocoronary bypass graft: Secondary | ICD-10-CM

## 2023-01-02 DIAGNOSIS — Z7984 Long term (current) use of oral hypoglycemic drugs: Secondary | ICD-10-CM | POA: Diagnosis not present

## 2023-01-02 DIAGNOSIS — I10 Essential (primary) hypertension: Secondary | ICD-10-CM | POA: Diagnosis not present

## 2023-01-02 DIAGNOSIS — F03911 Unspecified dementia, unspecified severity, with agitation: Secondary | ICD-10-CM | POA: Diagnosis present

## 2023-01-02 DIAGNOSIS — E1110 Type 2 diabetes mellitus with ketoacidosis without coma: Secondary | ICD-10-CM | POA: Diagnosis not present

## 2023-01-02 DIAGNOSIS — E860 Dehydration: Secondary | ICD-10-CM | POA: Diagnosis present

## 2023-01-02 DIAGNOSIS — E872 Acidosis, unspecified: Secondary | ICD-10-CM | POA: Diagnosis not present

## 2023-01-02 DIAGNOSIS — E1169 Type 2 diabetes mellitus with other specified complication: Secondary | ICD-10-CM | POA: Diagnosis present

## 2023-01-02 DIAGNOSIS — D631 Anemia in chronic kidney disease: Secondary | ICD-10-CM | POA: Diagnosis not present

## 2023-01-02 DIAGNOSIS — E1122 Type 2 diabetes mellitus with diabetic chronic kidney disease: Secondary | ICD-10-CM | POA: Diagnosis not present

## 2023-01-02 DIAGNOSIS — I2510 Atherosclerotic heart disease of native coronary artery without angina pectoris: Secondary | ICD-10-CM | POA: Diagnosis present

## 2023-01-02 DIAGNOSIS — B3789 Other sites of candidiasis: Secondary | ICD-10-CM | POA: Diagnosis present

## 2023-01-02 DIAGNOSIS — K5900 Constipation, unspecified: Secondary | ICD-10-CM | POA: Diagnosis not present

## 2023-01-02 DIAGNOSIS — N184 Chronic kidney disease, stage 4 (severe): Secondary | ICD-10-CM | POA: Diagnosis present

## 2023-01-02 DIAGNOSIS — R627 Adult failure to thrive: Secondary | ICD-10-CM | POA: Diagnosis not present

## 2023-01-02 DIAGNOSIS — E1165 Type 2 diabetes mellitus with hyperglycemia: Secondary | ICD-10-CM

## 2023-01-02 DIAGNOSIS — E440 Moderate protein-calorie malnutrition: Secondary | ICD-10-CM | POA: Diagnosis present

## 2023-01-02 DIAGNOSIS — N1832 Chronic kidney disease, stage 3b: Secondary | ICD-10-CM | POA: Diagnosis present

## 2023-01-02 DIAGNOSIS — I700 Atherosclerosis of aorta: Secondary | ICD-10-CM | POA: Diagnosis not present

## 2023-01-02 DIAGNOSIS — N390 Urinary tract infection, site not specified: Secondary | ICD-10-CM | POA: Diagnosis not present

## 2023-01-02 DIAGNOSIS — I428 Other cardiomyopathies: Secondary | ICD-10-CM | POA: Diagnosis present

## 2023-01-02 DIAGNOSIS — K219 Gastro-esophageal reflux disease without esophagitis: Secondary | ICD-10-CM | POA: Diagnosis not present

## 2023-01-02 DIAGNOSIS — E118 Type 2 diabetes mellitus with unspecified complications: Secondary | ICD-10-CM

## 2023-01-02 DIAGNOSIS — N179 Acute kidney failure, unspecified: Secondary | ICD-10-CM | POA: Diagnosis not present

## 2023-01-02 DIAGNOSIS — Z681 Body mass index (BMI) 19 or less, adult: Secondary | ICD-10-CM | POA: Diagnosis not present

## 2023-01-02 DIAGNOSIS — I129 Hypertensive chronic kidney disease with stage 1 through stage 4 chronic kidney disease, or unspecified chronic kidney disease: Secondary | ICD-10-CM | POA: Diagnosis not present

## 2023-01-02 DIAGNOSIS — E7800 Pure hypercholesterolemia, unspecified: Secondary | ICD-10-CM | POA: Diagnosis not present

## 2023-01-02 DIAGNOSIS — Z79899 Other long term (current) drug therapy: Secondary | ICD-10-CM

## 2023-01-02 DIAGNOSIS — Z7982 Long term (current) use of aspirin: Secondary | ICD-10-CM | POA: Diagnosis not present

## 2023-01-02 DIAGNOSIS — D649 Anemia, unspecified: Secondary | ICD-10-CM | POA: Diagnosis not present

## 2023-01-02 DIAGNOSIS — R109 Unspecified abdominal pain: Secondary | ICD-10-CM | POA: Diagnosis not present

## 2023-01-02 DIAGNOSIS — E119 Type 2 diabetes mellitus without complications: Secondary | ICD-10-CM

## 2023-01-02 DIAGNOSIS — I152 Hypertension secondary to endocrine disorders: Secondary | ICD-10-CM | POA: Diagnosis present

## 2023-01-02 LAB — URINALYSIS, ROUTINE W REFLEX MICROSCOPIC
Bilirubin Urine: NEGATIVE
Glucose, UA: NEGATIVE mg/dL
Hgb urine dipstick: NEGATIVE
Ketones, ur: NEGATIVE mg/dL
Nitrite: NEGATIVE
Protein, ur: 100 mg/dL — AB
Specific Gravity, Urine: 1.01 (ref 1.005–1.030)
WBC, UA: >50 WBC/hpf (ref 0–5)
pH: 5 (ref 5.0–8.0)

## 2023-01-02 LAB — HEMOGLOBIN A1C
Hgb A1c MFr Bld: 4.9 % (ref 4.8–5.6)
Mean Plasma Glucose: 93.93 mg/dL

## 2023-01-02 LAB — COMPREHENSIVE METABOLIC PANEL
ALT: 14 U/L (ref 0–44)
AST: 15 U/L (ref 15–41)
Albumin: 4 g/dL (ref 3.5–5.0)
Alkaline Phosphatase: 32 U/L — ABNORMAL LOW (ref 38–126)
Anion gap: 12 (ref 5–15)
BUN: 71 mg/dL — ABNORMAL HIGH (ref 8–23)
CO2: 19 mmol/L — ABNORMAL LOW (ref 22–32)
Calcium: 10.6 mg/dL — ABNORMAL HIGH (ref 8.9–10.3)
Chloride: 106 mmol/L (ref 98–111)
Creatinine, Ser: 6.03 mg/dL — ABNORMAL HIGH (ref 0.44–1.00)
GFR, Estimated: 6 mL/min — ABNORMAL LOW (ref >60)
Glucose, Bld: 168 mg/dL — ABNORMAL HIGH (ref 70–99)
Potassium: 4.7 mmol/L (ref 3.5–5.1)
Sodium: 137 mmol/L (ref 135–145)
Total Bilirubin: 0.3 mg/dL (ref 0.3–1.2)
Total Protein: 7.4 g/dL (ref 6.5–8.1)

## 2023-01-02 LAB — GLUCOSE, CAPILLARY: Glucose-Capillary: 125 mg/dL — ABNORMAL HIGH (ref 70–99)

## 2023-01-02 LAB — RETICULOCYTES
Immature Retic Fract: 4.9 % (ref 2.3–15.9)
RBC.: 2.48 MIL/uL — ABNORMAL LOW (ref 3.87–5.11)
Retic Count, Absolute: 19.6 10*3/uL (ref 19.0–186.0)
Retic Ct Pct: 0.8 % (ref 0.4–3.1)

## 2023-01-02 LAB — CBC
HCT: 24.5 % — ABNORMAL LOW (ref 36.0–46.0)
Hemoglobin: 7.8 g/dL — ABNORMAL LOW (ref 12.0–15.0)
MCH: 28.8 pg (ref 26.0–34.0)
MCHC: 31.8 g/dL (ref 30.0–36.0)
MCV: 90.4 fL (ref 80.0–100.0)
Platelets: 192 10*3/uL (ref 150–400)
RBC: 2.71 MIL/uL — ABNORMAL LOW (ref 3.87–5.11)
RDW: 15.9 % — ABNORMAL HIGH (ref 11.5–15.5)
WBC: 4.5 10*3/uL (ref 4.0–10.5)
nRBC: 0 % (ref 0.0–0.2)

## 2023-01-02 LAB — VITAMIN B12: Vitamin B-12: 958 pg/mL — ABNORMAL HIGH (ref 180–914)

## 2023-01-02 LAB — CBG MONITORING, ED: Glucose-Capillary: 142 mg/dL — ABNORMAL HIGH (ref 70–99)

## 2023-01-02 LAB — PROTEIN, URINE, RANDOM: Total Protein, Urine: 94 mg/dL

## 2023-01-02 LAB — SODIUM, URINE, RANDOM: Sodium, Ur: 74 mmol/L

## 2023-01-02 LAB — IRON AND TIBC
Iron: 58 ug/dL (ref 28–170)
Saturation Ratios: 22 % (ref 10.4–31.8)
TIBC: 266 ug/dL (ref 250–450)
UIBC: 208 ug/dL

## 2023-01-02 LAB — FOLATE: Folate: 10.2 ng/mL (ref >5.9)

## 2023-01-02 LAB — LIPASE, BLOOD: Lipase: 68 U/L — ABNORMAL HIGH (ref 11–51)

## 2023-01-02 LAB — FERRITIN: Ferritin: 194 ng/mL (ref 11–307)

## 2023-01-02 MED ORDER — AMLODIPINE BESYLATE 5 MG PO TABS
10.0000 mg | ORAL_TABLET | Freq: Every day | ORAL | Status: DC
  Administered 2023-01-02 – 2023-01-06 (×5): 10 mg via ORAL
  Filled 2023-01-02 (×5): qty 2

## 2023-01-02 MED ORDER — BISACODYL 5 MG PO TBEC
10.0000 mg | DELAYED_RELEASE_TABLET | Freq: Once | ORAL | Status: AC
  Administered 2023-01-02: 10 mg via ORAL
  Filled 2023-01-02: qty 2

## 2023-01-02 MED ORDER — ONDANSETRON HCL 4 MG PO TABS: 4.0000 mg | ORAL_TABLET | Freq: Four times a day (QID) | ORAL | Status: DC | PRN

## 2023-01-02 MED ORDER — HYDRALAZINE HCL 20 MG/ML IJ SOLN: 5.0000 mg | INTRAMUSCULAR | Status: DC | PRN

## 2023-01-02 MED ORDER — SODIUM CHLORIDE 0.9 % IV SOLN: INTRAVENOUS | Status: AC

## 2023-01-02 MED ORDER — ASPIRIN 81 MG PO TBEC
81.0000 mg | DELAYED_RELEASE_TABLET | Freq: Every day | ORAL | Status: DC
  Administered 2023-01-02 – 2023-01-06 (×5): 81 mg via ORAL
  Filled 2023-01-02 (×5): qty 1

## 2023-01-02 MED ORDER — ACETAMINOPHEN 650 MG RE SUPP: 650.0000 mg | Freq: Four times a day (QID) | RECTAL | Status: DC | PRN

## 2023-01-02 MED ORDER — PANTOPRAZOLE SODIUM 40 MG IV SOLR
40.0000 mg | Freq: Once | INTRAVENOUS | Status: AC
  Administered 2023-01-02: 40 mg via INTRAVENOUS
  Filled 2023-01-02: qty 10

## 2023-01-02 MED ORDER — HEPARIN SODIUM (PORCINE) 5000 UNIT/ML IJ SOLN
5000.0000 [IU] | Freq: Three times a day (TID) | INTRAMUSCULAR | Status: DC
  Administered 2023-01-02 – 2023-01-06 (×11): 5000 [IU] via SUBCUTANEOUS
  Filled 2023-01-02 (×12): qty 1

## 2023-01-02 MED ORDER — METOPROLOL TARTRATE 25 MG PO TABS
25.0000 mg | ORAL_TABLET | Freq: Every day | ORAL | Status: DC
  Administered 2023-01-02 – 2023-01-06 (×5): 25 mg via ORAL
  Filled 2023-01-02 (×5): qty 1

## 2023-01-02 MED ORDER — POLYETHYLENE GLYCOL 3350 17 G PO PACK
17.0000 g | PACK | Freq: Two times a day (BID) | ORAL | Status: AC
  Administered 2023-01-02 – 2023-01-03 (×2): 17 g via ORAL
  Filled 2023-01-02 (×3): qty 1

## 2023-01-02 MED ORDER — INSULIN ASPART 100 UNIT/ML IJ SOLN
0.0000 [IU] | Freq: Every day | INTRAMUSCULAR | Status: DC
  Administered 2023-01-03: 2 [IU] via SUBCUTANEOUS
  Administered 2023-01-05: 3 [IU] via SUBCUTANEOUS

## 2023-01-02 MED ORDER — SODIUM CHLORIDE 0.9 % IV BOLUS
1000.0000 mL | Freq: Once | INTRAVENOUS | Status: AC
  Administered 2023-01-02: 1000 mL via INTRAVENOUS

## 2023-01-02 MED ORDER — ROSUVASTATIN CALCIUM 20 MG PO TABS
20.0000 mg | ORAL_TABLET | Freq: Every day | ORAL | Status: DC
  Administered 2023-01-02 – 2023-01-06 (×5): 20 mg via ORAL
  Filled 2023-01-02 (×5): qty 1

## 2023-01-02 MED ORDER — INSULIN ASPART 100 UNIT/ML IJ SOLN
0.0000 [IU] | Freq: Three times a day (TID) | INTRAMUSCULAR | Status: DC
  Administered 2023-01-04 – 2023-01-05 (×3): 2 [IU] via SUBCUTANEOUS
  Administered 2023-01-05: 5 [IU] via SUBCUTANEOUS
  Administered 2023-01-06: 2 [IU] via SUBCUTANEOUS

## 2023-01-02 MED ORDER — ACETAMINOPHEN 325 MG PO TABS
650.0000 mg | ORAL_TABLET | Freq: Four times a day (QID) | ORAL | Status: DC | PRN
  Administered 2023-01-06: 650 mg via ORAL
  Filled 2023-01-02: qty 2

## 2023-01-02 MED ORDER — ONDANSETRON HCL 4 MG/2ML IJ SOLN: 4.0000 mg | Freq: Four times a day (QID) | INTRAMUSCULAR | Status: DC | PRN

## 2023-01-02 NOTE — H&P (Addendum)
TRH H&P   Patient Demographics:    Kelly Castillo, is a 89 y.o. female  MRN: [id number]   DOB - 04/13/1934  Admit Date - 01/02/2023  Outpatient Primary MD for the patient is Ivy Lynn, NP (Inactive)  Referring MD/NP/PA: Dr [email or web address]  Patient coming from: Home  Chief Complaint  Patient presents with   Emesis      HPI:    Kelly Castillo  is a 89 y.o. female, with past medical history of hypertension, hyperlipidemia, diabetes mellitus, CAD, GERD, patient presents to ED secondary to generalized weakness, poor appetite, failure to thrive, nausea, increased lethargy and confusion, symptoms has been progressive over the last few weeks, and she did have vomiting x 2 today, patient denies fever, chills, coffee-ground emesis, melena or bright red blood per rectum, patient has been compliant with her medication.  Reports patient has been compliant with her medication including antihypertensive medications for diabetes mellitus, she denies any NSAIDs use, or any recent IV exposure. -Her workup significant for elevated creatinine at 6.03, baseline 1.1 last summer, gradually increasing to 2.1 last November), CT abdomen pelvis with no acute findings, globin was low at 7.0, down from 9.7 last November, she was Hemoccult negative, [facility] hospitalist consulted to admit.  Review of systems:      A full 10 point Review of Systems was done, except as stated above, all other Review of Systems were negative.   With Past History of the following :    Past Medical History:  Diagnosis Date   Anemia    GERD (gastroesophageal reflux disease)    HTN (hypertension)    Hypercholesterolemia    LBBB (left bundle branch block)    Lumbar disc disease    Non-ischemic cardiomyopathy (HCC)    Poor short term memory    Type II diabetes mellitus ([place])       Past Surgical History:  Procedure Laterality  Date   APPENDECTOMY  1970's   [place]   CARDIAC CATHETERIZATION N/A 08/08/2015   Procedure: Left Heart Cath and Coronary Angiography;  Surgeon: Belva Crome, MD;  Location: [facility];  Service: Cardiovascular;  Laterality: N/A;   CARDIAC CATHETERIZATION  01/31/2014   CATARACT EXTRACTION W/ INTRAOCULAR LENS IMPLANT Right    CATARACT EXTRACTION W/PHACO  07/24/2011   Procedure: CATARACT EXTRACTION PHACO AND INTRAOCULAR LENS PLACEMENT (IOC);  Surgeon: Tonny Branch;  Location: [facility];  Service: Ophthalmology;  Laterality: Left;  CDE: 14.73   CORONARY ARTERY BYPASS GRAFT N/A 08/10/2015   Procedure: OFF PUMP CORONARY ARTERY BYPASS GRAFTING (CABG) UTILIZING THE LEFT INTERNAL MAMMARY ARTERY;  Surgeon: Melrose Nakayama, MD;  Location: [facility];  Service: Open Heart Surgery;  Laterality: N/A;   DILATION AND CURETTAGE OF UTERUS     EYE SURGERY Bilateral    "laser; related to diabetes"   FRACTURE SURGERY     LEFT HEART CATHETERIZATION WITH  CORONARY ANGIOGRAM N/A 01/31/2014   Procedure: LEFT HEART CATHETERIZATION WITH CORONARY ANGIOGRAM;  Surgeon: Peter M Martinique, MD;  Location: [facility];  Service: Cardiovascular;  Laterality: N/A;   ORIF ANKLE FRACTURE Right 1980's   TEE WITHOUT CARDIOVERSION N/A 08/10/2015   Procedure: TRANSESOPHAGEAL ECHOCARDIOGRAM (TEE);  Surgeon: Melrose Nakayama, MD;  Location: [facility];  Service: Open Heart Surgery;  Laterality: N/A;   TUBAL LIGATION     VAGINAL HYSTERECTOMY  1980's      Social History:     Social History   Tobacco Use   Smoking status: Never   Smokeless tobacco: Never  Substance Use Topics   Alcohol use: No    Alcohol/week: 0.0 standard drinks of alcohol       Family History :     Family History  Problem Relation Age of Onset   Anesthesia problems Neg Hx    Hypotension Neg Hx    Malignant hyperthermia Neg Hx    Pseudochol deficiency Neg Hx       Home Medications:   Prior to Admission medications   Medication Sig Start Date  End Date Taking? Authorizing Provider  acetaminophen (TYLENOL) 325 MG tablet Take 650 mg by mouth as needed.   Yes [provider]  aspirin 81 MG tablet Take 1 tablet (81 mg total) by mouth daily. 08/16/18  Yes Lelon Perla, MD  Cholecalciferol (VITAMIN D3) 125 MCG (5000 UT) TABS Take 1 tablet by mouth daily.   Yes [provider]  glimepiride (AMARYL) 4 MG tablet Take 2 tablets (8 mg total) by mouth daily with breakfast. (NEEDS TO BE SEEN BEFORE NEXT REFILL) Patient taking differently: Take 4 mg by mouth daily with breakfast. 10/07/22  Yes Ivy Lynn, NP  lisinopril (ZESTRIL) 40 MG tablet Take 1 tablet (40 mg total) by mouth daily. 10/07/22  Yes Ivy Lynn, NP  metFORMIN (GLUCOPHAGE) 500 MG tablet Take 1 tablet (500 mg total) by mouth daily. 10/07/22  Yes Ivy Lynn, NP  metoprolol tartrate (LOPRESSOR) 25 MG tablet TAKE (1) TABLET TWICE A DAY. Patient taking differently: Take 25 mg by mouth daily. TAKE (1) TABLET TWICE A DAY. 10/07/22  Yes Ivy Lynn, NP  rosuvastatin (CRESTOR) 20 MG tablet Take 1 tablet (20 mg total) by mouth daily. 10/07/22  Yes Ivy Lynn, NP  blood glucose meter kit and supplies May test up to 4 times a day. E11.9 10/07/22   Ivy Lynn, NP  ferrous sulfate (FEROSUL) 325 (65 FE) MG tablet TAKE ONE TABLET ONCE DAILY Patient not taking: Reported on 01/02/2023 11/25/22   Ivy Lynn, NP  fish oil-omega-3 fatty acids 1000 MG capsule Take 2 capsules (2 g total) by mouth daily. Patient not taking: Reported on 01/02/2023 10/08/22   Ivy Lynn, NP  [facility] VERIO test strip 1 each by Other route daily. 10/07/22   Ivy Lynn, NP     Allergies:     Allergies  Allergen Reactions   Penicillins Hives     Physical Exam:   Vitals  Blood pressure (!) 169/63, pulse 73, temperature 97.8 F (36.6 C), temperature source Oral, resp. rate 17, height 5' 3"$  (1.6 m), weight 40.8 kg, SpO2 98 %.   1. General , frail,  thin appearing female  lying in bed in NAD,    2. Normal affect and insight, Not Suicidal or Homicidal, Awake Alert, Oriented X 3.  3. No F.N deficits, ALL C.Nerves Intact, Strength 5/5 all 4  extremities, Sensation intact all 4 extremities, Plantars down going.  4. Ears and Eyes appear Normal, Conjunctivae clear, PERRLA. Moist Oral Mucosa.  5. Supple Neck, No JVD, No cervical lymphadenopathy appriciated, No Carotid Bruits.  6. Symmetrical Chest wall movement, Good air movement bilaterally, CTAB.  7. RRR, No Gallops, Rubs or Murmurs, No Parasternal Heave.  8. Positive Bowel Sounds, Abdomen Soft, No tenderness, No organomegaly appriciated,No rebound -guarding or rigidity.  9.  No Cyanosis, Normal Skin Turgor, No Skin Rash or Bruise.  10. Good muscle tone,  joints appear normal , no effusions, Normal ROM.     Data Review:    CBC Recent Labs  Lab 01/02/23 1501  WBC 4.5  HGB 7.8*  HCT 24.5*  PLT 192  MCV 90.4  MCH 28.8  MCHC 31.8  RDW 15.9*   ------------------------------------------------------------------------------------------------------------------  Chemistries  Recent Labs  Lab 01/02/23 1501  NA 137  K 4.7  CL 106  CO2 19*  GLUCOSE 168*  BUN 71*  CREATININE 6.03*  CALCIUM 10.6*  AST 15  ALT 14  ALKPHOS 32*  BILITOT 0.3   ------------------------------------------------------------------------------------------------------------------ estimated creatinine clearance is 4.2 mL/min (A) (by C-G formula based on SCr of 6.03 mg/dL (H)). ------------------------------------------------------------------------------------------------------------------ No results for input(s): "TSH", "T4TOTAL", "T3FREE", "THYROIDAB" in the last 72 hours.  Invalid input(s): "FREET3"  Coagulation profile No results for input(s): "INR", "PROTIME" in the last 168  hours. ------------------------------------------------------------------------------------------------------------------- No results for input(s): "DDIMER" in the last 72 hours. -------------------------------------------------------------------------------------------------------------------  Cardiac Enzymes No results for input(s): "CKMB", "TROPONINI", "MYOGLOBIN" in the last 168 hours.  Invalid input(s): "CK" ------------------------------------------------------------------------------------------------------------------ No results found for: "BNP"   ---------------------------------------------------------------------------------------------------------------  Urinalysis    Component Value Date/Time   COLORURINE YELLOW 01/02/2023 1730   APPEARANCEUR CLOUDY (A) 01/02/2023 1730   LABSPEC 1.010 01/02/2023 1730   PHURINE 5.0 01/02/2023 1730   GLUCOSEU NEGATIVE 01/02/2023 1730   HGBUR NEGATIVE 01/02/2023 1730   BILIRUBINUR NEGATIVE 01/02/2023 1730   KETONESUR NEGATIVE 01/02/2023 1730   PROTEINUR 100 (A) 01/02/2023 1730   UROBILINOGEN 0.2 08/09/2015 1615   NITRITE NEGATIVE 01/02/2023 1730   LEUKOCYTESUR LARGE (A) 01/02/2023 1730    ----------------------------------------------------------------------------------------------------------------   Imaging Results:    CT ABDOMEN PELVIS WO CONTRAST  Result Date: 01/02/2023 CLINICAL DATA:  Abdominal pain, acute, nonlocalized. Decreased oral intake this week with vomiting for a few days. EXAM: CT ABDOMEN AND PELVIS WITHOUT CONTRAST TECHNIQUE: Multidetector CT imaging of the abdomen and pelvis was performed following the standard protocol without IV contrast. RADIATION DOSE REDUCTION: This exam was performed according to the departmental dose-optimization program which includes automated exposure control, adjustment of the mA and/or kV according to patient size and/or use of iterative reconstruction technique. COMPARISON:  None  Available. FINDINGS: Lower chest: Minimal atelectasis or scarring at both lung bases. Previous median sternotomy with mild aortic and coronary artery atherosclerosis. Hepatobiliary: No definite focal hepatic abnormalities are identified on noncontrast imaging. No evidence of gallstones, gallbladder wall thickening or biliary dilatation. Pancreas: Unremarkable. No pancreatic ductal dilatation or surrounding inflammatory changes. Spleen: Normal in size without focal abnormality. Adrenals/Urinary Tract: Both adrenal glands appear normal. No evidence of urinary tract calculus, suspicious renal lesion or hydronephrosis. The bladder appears unremarkable for its degree of distention. Stomach/Bowel: No enteric contrast administered. The stomach appears unremarkable for its degree of distension. No evidence of bowel wall thickening, distention or surrounding inflammatory change. The appendix is not clearly seen, by report surgically absent. Moderate stool throughout the colon. Vascular/Lymphatic: There are no enlarged abdominal or pelvic  lymph nodes. Aortic and branch vessel atherosclerosis without evidence of aneurysm. Reproductive: Hysterectomy.  No evidence of adnexal mass. Other: No evidence of abdominal wall mass or hernia. No ascites. Musculoskeletal: No acute or significant osseous findings. Multilevel lumbar spondylosis with disc degeneration and bulging at several levels. There is the potential for spinal stenosis and/or nerve root encroachment. IMPRESSION: 1. No acute findings or clear explanation for the patient's symptoms on noncontrast imaging. Solid organ assessment limited. 2. Moderate stool throughout the colon suggesting constipation. 3. Multilevel lumbar spondylosis with potential for spinal stenosis and/or nerve root encroachment. 4.  Aortic Atherosclerosis (ICD10-I70.0). Electronically Signed   By: Richardean Sale M.D.   On: 01/02/2023 16:49    My personal review of EKG: Pending   Assessment & Plan:     Principal Problem:   AKI (acute kidney injury) ([place]) Active Problems:   Diabetes mellitus type 2 in nonobese [facility])   Essential hypertension   GERD   CAD (coronary artery disease), native coronary artery   Hyperlipidemia associated with type 2 diabetes mellitus ([place])   AKI -Patient with progressive decline in renal function, with creatinine 1.1 on June 2023, progressing to 2.1 on November 2024, to 6 this admission -No evidence of obstruction on CT scan, or physical exam, but will obtain bladder scan -Will check renal ultrasound -Will check UA, urine sodium and urine protein -Continue with IV hydration overnight recheck BMP in a.m. She denies any use of NSAIDs, no recent IV contrast exposure -Renal consulted, to evaluate in a.m. -Calcium level elevated, will check multiple myeloma panel  Failure to thrive/weight loss/nausea -This is most likely due to uremia with BUN of 71  Anemia of chronic kidney disease -She was Hemoccult negative in ED, will check anemia panel -Follow-up multiple myeloma panel  Hypertension  - blood pressure remains significantly elevated, will start on as needed hydralazine, will certainly hold her lisinopril and continue on home dose metoprolol, will add amlodipine 10 mg oral   Diabetes mellitus, type II, well-controlled -Daughter reports diabetes mellitus is well-controlled with A1c between 5-6 range, I will hold her metformin and glimepiride and will keep an insulin sliding scale during hospital stay, will recheck A1c as well  CAD Continue with aspirin, statin and beta-blockers  Hyperlipidemia -Continue with statin    DVT Prophylaxis Heparin   AM Labs Ordered, also please review Full Orders  Family Communication: Admission, patients condition and plan of care including tests being ordered have been discussed with the patient and daughter  who indicate understanding and agree with the plan and Code Status.  Code Status full  Likely DC to   home  Condition GUARDED    Consults called: renal by ED    Admission status: inpatient    Time spent in minutes : 70 minutes   Phillips Climes M.D on 01/02/2023 at 6:38 PM   [facility] - Office  [phone]

## 2023-01-02 NOTE — ED Provider Notes (Signed)
[facility] Provider Note   CSN: [id number] Arrival date & time: 01/02/23  1415     History {Add pertinent medical, surgical, social history, OB history to HPI:1} Chief Complaint  Patient presents with   Emesis    Kelly Castillo is a 89 y.o. female.  Patient presents with vomiting for couple days along with poor p.o. intake for over a month.  She has a history of cardiomyopathy hypertension diabetes coronary artery disease.  The history is provided by the patient and a relative. No language interpreter was used.  Emesis Severity:  Mild Timing:  Intermittent Quality:  Unable to specify Able to tolerate:  Liquids Progression:  Improving Chronicity:  New Recent urination:  Decreased Context: not post-tussive   Relieved by:  Nothing Worsened by:  Nothing Associated symptoms: no abdominal pain, no cough, no diarrhea and no headaches   Risk factors: no alcohol use        Home Medications Prior to Admission medications   Medication Sig Start Date End Date Taking? Authorizing Provider  acetaminophen (TYLENOL) 325 MG tablet Take 650 mg by mouth as needed.   Yes [provider]  aspirin 81 MG tablet Take 1 tablet (81 mg total) by mouth daily. 08/16/18  Yes Lelon Perla, MD  Cholecalciferol (VITAMIN D3) 125 MCG (5000 UT) TABS Take 1 tablet by mouth daily.   Yes [provider]  glimepiride (AMARYL) 4 MG tablet Take 2 tablets (8 mg total) by mouth daily with breakfast. (NEEDS TO BE SEEN BEFORE NEXT REFILL) Patient taking differently: Take 4 mg by mouth daily with breakfast. 10/07/22  Yes Ivy Lynn, NP  lisinopril (ZESTRIL) 40 MG tablet Take 1 tablet (40 mg total) by mouth daily. 10/07/22  Yes Ivy Lynn, NP  metFORMIN (GLUCOPHAGE) 500 MG tablet Take 1 tablet (500 mg total) by mouth daily. 10/07/22  Yes Ivy Lynn, NP  metoprolol tartrate (LOPRESSOR) 25 MG tablet TAKE (1) TABLET TWICE A  DAY. Patient taking differently: Take 25 mg by mouth daily. TAKE (1) TABLET TWICE A DAY. 10/07/22  Yes Ivy Lynn, NP  rosuvastatin (CRESTOR) 20 MG tablet Take 1 tablet (20 mg total) by mouth daily. 10/07/22  Yes Ivy Lynn, NP  blood glucose meter kit and supplies May test up to 4 times a day. E11.9 10/07/22   Ivy Lynn, NP  ferrous sulfate (FEROSUL) 325 (65 FE) MG tablet TAKE ONE TABLET ONCE DAILY Patient not taking: Reported on 01/02/2023 11/25/22   Ivy Lynn, NP  fish oil-omega-3 fatty acids 1000 MG capsule Take 2 capsules (2 g total) by mouth daily. Patient not taking: Reported on 01/02/2023 10/08/22   Ivy Lynn, NP  [facility] VERIO test strip 1 each by Other route daily. 10/07/22   Ivy Lynn, NP      Allergies    Penicillins    Review of Systems   Review of Systems  Constitutional:  Negative for appetite change and fatigue.  HENT:  Negative for congestion, ear discharge and sinus pressure.   Eyes:  Negative for discharge.  Respiratory:  Negative for cough.   Cardiovascular:  Negative for chest pain.  Gastrointestinal:  Positive for vomiting. Negative for abdominal pain and diarrhea.  Genitourinary:  Negative for frequency and hematuria.  Musculoskeletal:  Negative for back pain.  Skin:  Negative for rash.  Neurological:  Negative for seizures and headaches.  Psychiatric/Behavioral:  Negative for hallucinations.  Physical Exam Updated Vital Signs BP (!) 169/63   Pulse 73   Temp 97.8 F (36.6 C) (Oral)   Resp 17   Ht 5' 3"$  (1.6 m)   Wt 40.8 kg   SpO2 98%   BMI 15.94 kg/m  Physical Exam Vitals and nursing note reviewed.  Constitutional:      Appearance: She is well-developed.  HENT:     Head: Normocephalic.     Nose: Nose normal.  Eyes:     General: No scleral icterus.    Conjunctiva/sclera: Conjunctivae normal.  Neck:     Thyroid: No thyromegaly.  Cardiovascular:     Rate and Rhythm: Normal rate and regular rhythm.      Heart sounds: No murmur heard.    No friction rub. No gallop.  Pulmonary:     Breath sounds: No stridor. No wheezing or rales.  Chest:     Chest wall: No tenderness.  Abdominal:     General: There is no distension.     Tenderness: There is no abdominal tenderness. There is no rebound.  Genitourinary:    Comments: Rectal heme-negative and dark stool Musculoskeletal:        General: Normal range of motion.     Cervical back: Neck supple.  Lymphadenopathy:     Cervical: No cervical adenopathy.  Skin:    Findings: No erythema or rash.  Neurological:     Mental Status: She is alert and oriented to person, place, and time.     Motor: No abnormal muscle tone.     Coordination: Coordination normal.  Psychiatric:        Behavior: Behavior normal.     ED Results / Procedures / Treatments   Labs (all labs ordered are listed, but only abnormal results are displayed) Labs Reviewed  LIPASE, BLOOD - Abnormal; Notable for the following components:      Result Value   Lipase 68 (*)    All other components within normal limits  COMPREHENSIVE METABOLIC PANEL - Abnormal; Notable for the following components:   CO2 19 (*)    Glucose, Bld 168 (*)    BUN 71 (*)    Creatinine, Ser 6.03 (*)    Calcium 10.6 (*)    Alkaline Phosphatase 32 (*)    GFR, Estimated 6 (*)    All other components within normal limits  CBC - Abnormal; Notable for the following components:   RBC 2.71 (*)    Hemoglobin 7.8 (*)    HCT 24.5 (*)    RDW 15.9 (*)    All other components within normal limits  CBG MONITORING, ED - Abnormal; Notable for the following components:   Glucose-Capillary 142 (*)    All other components within normal limits  URINALYSIS, ROUTINE W REFLEX MICROSCOPIC  PTH, INTACT AND CALCIUM  VITAMIN B12  FOLATE  IRON AND TIBC  FERRITIN  RETICULOCYTES  SODIUM, URINE, RANDOM  PROTEIN, URINE, RANDOM  POC OCCULT BLOOD, ED    EKG None  Radiology CT ABDOMEN PELVIS WO CONTRAST  Result  Date: 01/02/2023 CLINICAL DATA:  Abdominal pain, acute, nonlocalized. Decreased oral intake this week with vomiting for a few days. EXAM: CT ABDOMEN AND PELVIS WITHOUT CONTRAST TECHNIQUE: Multidetector CT imaging of the abdomen and pelvis was performed following the standard protocol without IV contrast. RADIATION DOSE REDUCTION: This exam was performed according to the departmental dose-optimization program which includes automated exposure control, adjustment of the mA and/or kV according to patient size and/or use of iterative  reconstruction technique. COMPARISON:  None Available. FINDINGS: Lower chest: Minimal atelectasis or scarring at both lung bases. Previous median sternotomy with mild aortic and coronary artery atherosclerosis. Hepatobiliary: No definite focal hepatic abnormalities are identified on noncontrast imaging. No evidence of gallstones, gallbladder wall thickening or biliary dilatation. Pancreas: Unremarkable. No pancreatic ductal dilatation or surrounding inflammatory changes. Spleen: Normal in size without focal abnormality. Adrenals/Urinary Tract: Both adrenal glands appear normal. No evidence of urinary tract calculus, suspicious renal lesion or hydronephrosis. The bladder appears unremarkable for its degree of distention. Stomach/Bowel: No enteric contrast administered. The stomach appears unremarkable for its degree of distension. No evidence of bowel wall thickening, distention or surrounding inflammatory change. The appendix is not clearly seen, by report surgically absent. Moderate stool throughout the colon. Vascular/Lymphatic: There are no enlarged abdominal or pelvic lymph nodes. Aortic and branch vessel atherosclerosis without evidence of aneurysm. Reproductive: Hysterectomy.  No evidence of adnexal mass. Other: No evidence of abdominal wall mass or hernia. No ascites. Musculoskeletal: No acute or significant osseous findings. Multilevel lumbar spondylosis with disc degeneration and  bulging at several levels. There is the potential for spinal stenosis and/or nerve root encroachment. IMPRESSION: 1. No acute findings or clear explanation for the patient's symptoms on noncontrast imaging. Solid organ assessment limited. 2. Moderate stool throughout the colon suggesting constipation. 3. Multilevel lumbar spondylosis with potential for spinal stenosis and/or nerve root encroachment. 4.  Aortic Atherosclerosis (ICD10-I70.0). Electronically Signed   By: Richardean Sale M.D.   On: 01/02/2023 16:49    Procedures Procedures  {Document cardiac monitor, telemetry assessment procedure when appropriate:1}  Medications Ordered in ED Medications  sodium chloride 0.9 % bolus 1,000 mL (1,000 mLs Intravenous Bolus 01/02/23 1616)  pantoprazole (PROTONIX) injection 40 mg (40 mg Intravenous Given 01/02/23 1616)    ED Course/ Medical Decision Making/ A&P  I spoke with Dr. Joylene Grapes nephrology.  He states to hydrate the patient and nephrology will see the patient tomorrow {   Click here for ABCD2, HEART and other calculatorsREFRESH Note before signing :1}                          Medical Decision Making Amount and/or Complexity of Data Reviewed Labs: ordered. Radiology: ordered.  Risk Prescription drug management. Decision regarding hospitalization.   Patient with renal failure.  She will be admitted to medicine with nephrology consulting tomorrow.  Patient will be hydrated  {Document critical care time when appropriate:1} {Document review of labs and clinical decision tools ie heart score, Chads2Vasc2 etc:1}  {Document your independent review of radiology images, and any outside records:1} {Document your discussion with family members, caretakers, and with consultants:1} {Document social determinants of health affecting pt's care:1} {Document your decision making why or why not admission, treatments were needed:1} Final Clinical Impression(s) / ED Diagnoses Final diagnoses:  AKI  (acute kidney injury) ([place])    Rx / DC Orders ED Discharge Orders     None

## 2023-01-02 NOTE — ED Notes (Signed)
Patient informed a urine sample is needed when possible.

## 2023-01-02 NOTE — ED Triage Notes (Signed)
Complaining of not eating well this week, has vomited 2 times the last couple of days. Her daughter said that the color of her stool is dark as well.

## 2023-01-02 NOTE — ED Notes (Signed)
Patients daughter states patient was at home with the niece this morning and after drinking a boost patient vomited a large amount. Daughter states that patient has slowly gotten weaker and has not really been eating since Thanksgiving. Patient states she is not having any pain at this time.

## 2023-01-03 DIAGNOSIS — N179 Acute kidney failure, unspecified: Secondary | ICD-10-CM | POA: Diagnosis not present

## 2023-01-03 LAB — CBC
HCT: 20.6 % — ABNORMAL LOW (ref 36.0–46.0)
Hemoglobin: 6.5 g/dL — CL (ref 12.0–15.0)
MCH: 28.8 pg (ref 26.0–34.0)
MCHC: 31.6 g/dL (ref 30.0–36.0)
MCV: 91.2 fL (ref 80.0–100.0)
Platelets: 217 10*3/uL (ref 150–400)
RBC: 2.26 MIL/uL — ABNORMAL LOW (ref 3.87–5.11)
RDW: 16 % — ABNORMAL HIGH (ref 11.5–15.5)
WBC: 4.1 10*3/uL (ref 4.0–10.5)
nRBC: 0 % (ref 0.0–0.2)

## 2023-01-03 LAB — RENAL FUNCTION PANEL
Albumin: 3.4 g/dL — ABNORMAL LOW (ref 3.5–5.0)
Anion gap: 9 (ref 5–15)
BUN: 59 mg/dL — ABNORMAL HIGH (ref 8–23)
CO2: 18 mmol/L — ABNORMAL LOW (ref 22–32)
Calcium: 9.2 mg/dL (ref 8.9–10.3)
Chloride: 114 mmol/L — ABNORMAL HIGH (ref 98–111)
Creatinine, Ser: 5.19 mg/dL — ABNORMAL HIGH (ref 0.44–1.00)
GFR, Estimated: 8 mL/min — ABNORMAL LOW (ref >60)
Glucose, Bld: 97 mg/dL (ref 70–99)
Phosphorus: 3.7 mg/dL (ref 2.5–4.6)
Potassium: 4.2 mmol/L (ref 3.5–5.1)
Sodium: 141 mmol/L (ref 135–145)

## 2023-01-03 LAB — HEMOGLOBIN AND HEMATOCRIT, BLOOD
HCT: 21.8 % — ABNORMAL LOW (ref 36.0–46.0)
Hemoglobin: 6.8 g/dL — CL (ref 12.0–15.0)

## 2023-01-03 LAB — GLUCOSE, CAPILLARY
Glucose-Capillary: 87 mg/dL (ref 70–99)
Glucose-Capillary: 119 mg/dL — ABNORMAL HIGH (ref 70–99)
Glucose-Capillary: 193 mg/dL — ABNORMAL HIGH (ref 70–99)
Glucose-Capillary: 216 mg/dL — ABNORMAL HIGH (ref 70–99)

## 2023-01-03 LAB — PROTIME-INR
INR: 1.1 (ref 0.8–1.2)
Prothrombin Time: 14.2 seconds (ref 11.4–15.2)

## 2023-01-03 LAB — PREPARE RBC (CROSSMATCH)

## 2023-01-03 MED ORDER — LORAZEPAM 2 MG/ML IJ SOLN
1.0000 mg | Freq: Once | INTRAMUSCULAR | Status: DC
  Filled 2023-01-03: qty 1

## 2023-01-03 MED ORDER — LACTATED RINGERS IV SOLN: INTRAVENOUS | Status: DC

## 2023-01-03 MED ORDER — HALOPERIDOL LACTATE 5 MG/ML IJ SOLN
5.0000 mg | Freq: Once | INTRAMUSCULAR | Status: AC
  Administered 2023-01-03: 5 mg via INTRAMUSCULAR
  Filled 2023-01-03: qty 1

## 2023-01-03 MED ORDER — SODIUM CHLORIDE 0.9% IV SOLUTION: Freq: Once | INTRAVENOUS | Status: AC

## 2023-01-03 MED ORDER — DARBEPOETIN ALFA 100 MCG/0.5ML IJ SOSY
100.0000 ug | PREFILLED_SYRINGE | Freq: Once | INTRAMUSCULAR | Status: AC
  Administered 2023-01-03: 100 ug via SUBCUTANEOUS
  Filled 2023-01-03: qty 0.5

## 2023-01-03 MED ORDER — PANTOPRAZOLE SODIUM 40 MG PO TBEC
40.0000 mg | DELAYED_RELEASE_TABLET | Freq: Every day | ORAL | Status: DC
  Administered 2023-01-03 – 2023-01-06 (×4): 40 mg via ORAL
  Filled 2023-01-03 (×4): qty 1

## 2023-01-03 NOTE — Progress Notes (Signed)
PROGRESS NOTE     Kelly Castillo, is a 89 y.o. female, DOB - 07-May-1934, TD:[id number]  Admit date - 01/02/2023   Admitting Physician Albertine Patricia, MD  Outpatient Primary MD for the patient is Ivy Lynn, NP (Inactive)  LOS - 1  Chief Complaint  Patient presents with   Emesis        Brief Narrative:  89 y.o. female, with past medical history of hypertension, hyperlipidemia, diabetes mellitus, CAD, GERD, admitted on 01/02/2023 with AKI on CKD 3B and worsening anemia    -Assessment and Plan: 1)AKI----acute kidney injury on CKD stage -3B---suspect related to dehydration in setting of poor oral intake and emesis with diarrhea compounded by lisinopril use -   creatinine on admission=6.03  ,  baseline creatinine = 1.1 -Creatinine trending down with hydration, -Continue to hold lisinopril -Metabolic acidosis noted -Continue gentle hydration -Nephrology consult appreciated -Renal ultrasound without obstructive uropathy -renally adjust medications, avoid nephrotoxic agents / dehydration  / hypotension  2) acute on chronic symptomatic anemia--suspect related to underlying CKD--hemoglobin was 9.7 couple months ago -Renal function is worsening and hemoglobin is now less than 7 -Transfuse 1 unit of PRBC -Give Procrit/Aranesp -No bleeding concerns at this time -Anemia workup including ferritin, serum iron, TIBC, folate and B12 are all normal  3)FTT- consider dietitian consult  4)DM2- A1c 4.9 reflecting excellent diabetic control PTA Use Novolog/Humalog Sliding scale insulin with Accu-Cheks/Fingersticks as ordered   5)H/o CAD--chest pain-free continue aspirin statin and beta-blockers  6)HTN--BP improving with medication adjustments, -Continue to hold lisinopril due to worsening renal function  Status is: Inpatient   Disposition: The patient is from: Home              Anticipated d/c is to: Home              Anticipated d/c date is: 2 days              Patient  currently is not medically stable to d/c. Barriers: Not Clinically Stable-   Code Status :  -  Code Status: Full Code   Family Communication:    Discussed with daughter Kennyth Lose  DVT Prophylaxis  :   - SCDs  heparin injection 5,000 Units Start: 01/02/23 2200 SCDs Start: 01/02/23 1839   Lab Results  Component Value Date   PLT 217 01/03/2023    Inpatient Medications  Scheduled Meds:  amLODipine  10 mg Oral Daily   aspirin EC  81 mg Oral Daily   heparin  5,000 Units Subcutaneous Q8H   insulin aspart  0-5 Units Subcutaneous QHS   insulin aspart  0-9 Units Subcutaneous TID WC   metoprolol tartrate  25 mg Oral Daily   pantoprazole  40 mg Oral Daily   polyethylene glycol  17 g Oral BID   rosuvastatin  20 mg Oral Daily   Continuous Infusions:  lactated ringers 100 mL/hr at 01/03/23 1823   PRN Meds:.acetaminophen **OR** acetaminophen, hydrALAZINE, ondansetron **OR** ondansetron (ZOFRAN) IV   Anti-infectives (From admission, onward)    None         Subjective: Russ Halo today has no fevers, no emesis,  No chest pain,   -Daughter Kennyth Lose at bedside -Fatigue persist -Appetite is not great   Objective: Vitals:   01/03/23 1109 01/03/23 1230 01/03/23 1248 01/03/23 1529  BP: 103/84 (!) 123/45 (!) 103/55 (!) 141/49  Pulse: 67 66 70 63  Resp: 16 16 18 19  $ Temp: 98.3 F (36.8 C) 98.1 F (36.7 C) 98.3  F (36.8 C) 98.4 F (36.9 C)  TempSrc: Oral Oral  Oral  SpO2: 100% 100%  100%  Weight:      Height:        Intake/Output Summary (Last 24 hours) at 01/03/2023 1849 Last data filed at 01/03/2023 1823 Gross per 24 hour  Intake 1214.89 ml  Output 3 ml  Net 1211.89 ml   Filed Weights   01/02/23 1428  Weight: 40.8 kg    Physical Exam  Gen:- Awake Alert, no acute distress HEENT:- [place].AT, No sclera icterus Neck-Supple Neck,No JVD,.  Lungs-  CTAB , fair symmetrical air movement CV- S1, S2 normal, regular  Abd-  +ve B.Sounds, Abd Soft, No tenderness,     Extremity/Skin:- +ve  edema, pedal pulses present  Psych-affect is appropriate, oriented x3 Neuro-no new focal deficits, no tremors  Data Reviewed: I have personally reviewed following labs and imaging studies  CBC: Recent Labs  Lab 01/02/23 1501 01/03/23 0536 01/03/23 0706  WBC 4.5 4.1  --   HGB 7.8* 6.5* 6.8*  HCT 24.5* 20.6* 21.8*  MCV 90.4 91.2  --   PLT 192 217  --    Basic Metabolic Panel: Recent Labs  Lab 01/02/23 1501 01/03/23 0536  NA 137 141  K 4.7 4.2  CL 106 114*  CO2 19* 18*  GLUCOSE 168* 97  BUN 71* 59*  CREATININE 6.03* 5.19*  CALCIUM 10.6* 9.2  PHOS  --  3.7   GFR: Estimated Creatinine Clearance: 4.9 mL/min (A) (by C-G formula based on SCr of 5.19 mg/dL (H)). Liver Function Tests: Recent Labs  Lab 01/02/23 1501 01/03/23 0536  AST 15  --   ALT 14  --   ALKPHOS 32*  --   BILITOT 0.3  --   PROT 7.4  --   ALBUMIN 4.0 3.4*   HbA1C: Recent Labs    01/02/23 1803  HGBA1C 4.9   Radiology Studies: US RENAL  Result Date: 01/02/2023 CLINICAL DATA:  Acute kidney injury EXAM: RENAL / URINARY TRACT ULTRASOUND COMPLETE COMPARISON:  CT 01/02/2023 FINDINGS: Right Kidney: Renal measurements: 9.8 x 4.4 x 5.2 cm = volume: 119 mL. Echogenicity within normal limits. No mass or hydronephrosis visualized. Left Kidney: Renal measurements: 8.5 x 5.4 x 4.7 cm = volume: 112 mL. Echogenicity normal. No hydronephrosis. Cysts measuring up to 1.5 cm, no imaging follow-up is recommended. Bladder: Appears normal for degree of bladder distention. Other: None. IMPRESSION: Negative for hydronephrosis. Electronically Signed   By: Donavan Foil M.D.   On: 01/02/2023 22:23   CT ABDOMEN PELVIS WO CONTRAST  Result Date: 01/02/2023 CLINICAL DATA:  Abdominal pain, acute, nonlocalized. Decreased oral intake this week with vomiting for a few days. EXAM: CT ABDOMEN AND PELVIS WITHOUT CONTRAST TECHNIQUE: Multidetector CT imaging of the abdomen and pelvis was performed following the  standard protocol without IV contrast. RADIATION DOSE REDUCTION: This exam was performed according to the departmental dose-optimization program which includes automated exposure control, adjustment of the mA and/or kV according to patient size and/or use of iterative reconstruction technique. COMPARISON:  None Available. FINDINGS: Lower chest: Minimal atelectasis or scarring at both lung bases. Previous median sternotomy with mild aortic and coronary artery atherosclerosis. Hepatobiliary: No definite focal hepatic abnormalities are identified on noncontrast imaging. No evidence of gallstones, gallbladder wall thickening or biliary dilatation. Pancreas: Unremarkable. No pancreatic ductal dilatation or surrounding inflammatory changes. Spleen: Normal in size without focal abnormality. Adrenals/Urinary Tract: Both adrenal glands appear normal. No evidence of urinary tract calculus, suspicious renal  lesion or hydronephrosis. The bladder appears unremarkable for its degree of distention. Stomach/Bowel: No enteric contrast administered. The stomach appears unremarkable for its degree of distension. No evidence of bowel wall thickening, distention or surrounding inflammatory change. The appendix is not clearly seen, by report surgically absent. Moderate stool throughout the colon. Vascular/Lymphatic: There are no enlarged abdominal or pelvic lymph nodes. Aortic and branch vessel atherosclerosis without evidence of aneurysm. Reproductive: Hysterectomy.  No evidence of adnexal mass. Other: No evidence of abdominal wall mass or hernia. No ascites. Musculoskeletal: No acute or significant osseous findings. Multilevel lumbar spondylosis with disc degeneration and bulging at several levels. There is the potential for spinal stenosis and/or nerve root encroachment. IMPRESSION: 1. No acute findings or clear explanation for the patient's symptoms on noncontrast imaging. Solid organ assessment limited. 2. Moderate stool throughout  the colon suggesting constipation. 3. Multilevel lumbar spondylosis with potential for spinal stenosis and/or nerve root encroachment. 4.  Aortic Atherosclerosis (ICD10-I70.0). Electronically Signed   By: Richardean Sale M.D.   On: 01/02/2023 16:49     Scheduled Meds:  amLODipine  10 mg Oral Daily   aspirin EC  81 mg Oral Daily   heparin  5,000 Units Subcutaneous Q8H   insulin aspart  0-5 Units Subcutaneous QHS   insulin aspart  0-9 Units Subcutaneous TID WC   metoprolol tartrate  25 mg Oral Daily   pantoprazole  40 mg Oral Daily   polyethylene glycol  17 g Oral BID   rosuvastatin  20 mg Oral Daily   Continuous Infusions:  lactated ringers 100 mL/hr at 01/03/23 1823     LOS: 1 day    Roxan Hockey M.D on 01/03/2023 at 6:49 PM  Go to [email or web address] - for contact info  [facility] - Office  [phone]  If 7PM-7AM, please contact night-coverage [email or web address] 01/03/2023, 6:49 PM

## 2023-01-03 NOTE — Consult Note (Signed)
Nephrology Consult   Requesting provider: Roxan Hockey Service requesting consult: Hospitalist Reason for consult: AKI on CKD 3b   Assessment/Recommendations: Kelly Castillo is a/an 89 y.o. female with a past medical history HTN, HLD, DM2, CAD, GERD who present w/ AKI   AKI on CKD 3b: Previous baseline around 1-1.3.  Last creatinine of 2 in November.  May have been dehydrated at that time as well.  Now with signs of ongoing dehydration.  Likely failure to thrive and poor p.o. intake contributing.  Also on lisinopril at home.  Urine also appears to be consistent with UTI -Hold lisinopril -Continue with IV fluids for now -Continue to monitor daily Cr, Dose meds for GFR -Monitor Daily I/Os, Daily weight  -Maintain MAP>65 for optimal renal perfusion.  -Avoid nephrotoxic medications including NSAIDs -Use synthetic opioids (Fentanyl/Dilaudid) if needed -Abdominal imaging without concern -Currently no indication for HD  UTI: Present on urinalysis.  Treatment per primary team.  Failure to thrive/fatigue: Likely multifactorial.  Treat AKI and UTI as above and monitor response.  Anemia: Likely multifactorial.  Transfusions per primary team  DM2: Management per primary team  Metabolic acidosis: Mild with bicarb of 18.  Continue to monitor  Hypertension: Holding home lisinopril.  Continue home metoprolol.  Amlodipine was added.   Recommendations conveyed to primary service.    [facility] 01/03/2023 10:21 AM   _____________________________________________________________________________________ CC: emesis, poor appetite  History of Present Illness: Kelly Castillo is a/an 89 y.o. female with a past medical history of HTN, HLD, DM2, CAD, GERD who presents with fatigue and emesis.  The patient has some dementia so history was predominately obtained from the daughter.  The patient's daughter states that the patient has had worsening weakness  for the past week or 2.  Daughter first noticed some poor appetite and vomiting last week.  She also states that the patient has been weaker than normal.  She has had multiple episodes of vomiting since then.  None are bloody.  No obvious fevers, chills, shortness of breath, chest pain, dysuria, hematuria.  Patient has been more confused than normal.  Symptoms have been getting worse.  Daughter also noted that the patient was asking for water yesterday and states that her mom never drinks much water and so she knew something was wrong.  She had a creatinine baseline of around 1-1.3 in the past.  Creatinine was higher around 2 in November along with some mild hypercalcemia.  On arrival her creatinine was 6.  CT abdomen pelvis without major concern except for constipation.  Hemoglobin was also found to be low.  This morning creatinine improved to 5.2 after IV fluids.  Hemoglobin was less than 7.   Medications:  Current Facility-Administered Medications  Medication Dose Route Frequency Provider Last Rate Last Admin   0.9 %  sodium chloride infusion (Manually program via Guardrails IV Fluids)   Intravenous Once Emokpae, Courage, MD       acetaminophen (TYLENOL) tablet 650 mg  650 mg Oral Q6H PRN Elgergawy, Silver Huguenin, MD       Or   acetaminophen (TYLENOL) suppository 650 mg  650 mg Rectal Q6H PRN Elgergawy, Silver Huguenin, MD       amLODipine (NORVASC) tablet 10 mg  10 mg Oral Daily Elgergawy, Silver Huguenin, MD   10 mg at 01/03/23 0954   aspirin EC tablet 81 mg  81 mg Oral Daily Elgergawy, Silver Huguenin, MD   81 mg at 01/03/23 [phone]  Darbepoetin Alfa (ARANESP) injection 100 mcg  100 mcg Subcutaneous Once Emokpae, Courage, MD       heparin injection 5,000 Units  5,000 Units Subcutaneous Q8H Elgergawy, Silver Huguenin, MD   5,000 Units at 01/03/23 0500   hydrALAZINE (APRESOLINE) injection 5 mg  5 mg Intravenous Q4H PRN Elgergawy, Silver Huguenin, MD       insulin aspart (novoLOG) injection 0-5 Units  0-5 Units Subcutaneous QHS  Elgergawy, Silver Huguenin, MD       insulin aspart (novoLOG) injection 0-9 Units  0-9 Units Subcutaneous TID WC Elgergawy, Silver Huguenin, MD       lactated ringers infusion   Intravenous Continuous Reesa Chew, MD       metoprolol tartrate (LOPRESSOR) tablet 25 mg  25 mg Oral Daily Elgergawy, Silver Huguenin, MD   25 mg at 01/03/23 0955   ondansetron (ZOFRAN) tablet 4 mg  4 mg Oral Q6H PRN Elgergawy, Silver Huguenin, MD       Or   ondansetron (ZOFRAN) injection 4 mg  4 mg Intravenous Q6H PRN Elgergawy, Silver Huguenin, MD       pantoprazole (PROTONIX) EC tablet 40 mg  40 mg Oral Daily Emokpae, Courage, MD       polyethylene glycol (MIRALAX / GLYCOLAX) packet 17 g  17 g Oral BID Elgergawy, Silver Huguenin, MD   17 g at 01/03/23 0953   rosuvastatin (CRESTOR) tablet 20 mg  20 mg Oral Daily Elgergawy, Silver Huguenin, MD   20 mg at 01/03/23 0955     ALLERGIES Penicillins  MEDICAL HISTORY Past Medical History:  Diagnosis Date   Anemia    GERD (gastroesophageal reflux disease)    HTN (hypertension)    Hypercholesterolemia    LBBB (left bundle branch block)    Lumbar disc disease    Non-ischemic cardiomyopathy ([place])    Poor short term memory    Type II diabetes mellitus ([place])      SOCIAL HISTORY Social History   Socioeconomic History   Marital status: Widowed    Spouse name: Not on file   Number of children: 10   Years of education: Not on file   Highest education level: Not on file  Occupational History   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  Tobacco Use   Smoking status: Never   Smokeless tobacco: Never  Substance and Sexual Activity   Alcohol use: No    Alcohol/week: 0.0 standard drinks of alcohol   Drug use: No   Sexual activity: Yes    Birth control/protection: Surgical  Other Topics Concern   Not on file  Social History Narrative   9 children living and 1 deceased.   Lives with youngest Son. Daughter, Kennyth Lose, comes during the day to help patient with ADLs and stays  until her brother gets home.    Social Determinants of Health   Financial Resource Strain: Low Risk  (09/23/2022)   Overall Financial Resource Strain (CARDIA)    Difficulty of Paying Living Expenses: Not hard at all  Food Insecurity: No Food Insecurity (01/02/2023)   Hunger Vital Sign    Worried About Running Out of Food in the Last Year: Never true    Ran Out of Food in the Last Year: Never true  Transportation Needs: No Transportation Needs (01/02/2023)   PRAPARE - Hydrologist (Medical): No    Lack of Transportation (Non-Medical): No  Physical Activity: Insufficiently Active (09/23/2022)   Exercise Vital Sign  Days of Exercise per Week: 2 days    Minutes of Exercise per Session: 20 min  Stress: No Stress Concern Present (09/23/2022)   [place]    Feeling of Stress : Not at all  Social Connections: Moderately Isolated (09/23/2022)   Social Connection and Isolation Panel [NHANES]    Frequency of Communication with Friends and Family: More than three times a week    Frequency of Social Gatherings with Friends and Family: More than three times a week    Attends Religious Services: 1 to 4 times per year    Active Member of [employer] or Organizations: No    Attends Archivist Meetings: Never    Marital Status: Widowed  Intimate Partner Violence: Not At Risk (01/02/2023)   Humiliation, Afraid, Rape, and Kick questionnaire    Fear of Current or Ex-Partner: No    Emotionally Abused: No    Physically Abused: No    Sexually Abused: No     FAMILY HISTORY Family History  Problem Relation Age of Onset   Anesthesia problems Neg Hx    Hypotension Neg Hx    Malignant hyperthermia Neg Hx    Pseudochol deficiency Neg Hx       Review of Systems: 12 systems reviewed Otherwise as per HPI, all other systems reviewed and negative  Physical Exam: Vitals:   01/03/23 0703 01/03/23 0954  BP:  (!) 138/54 (!) 143/58  Pulse: 72 75  Resp: 18   Temp: 98.3 F (36.8 C)   SpO2: 100%    Total I/O In: 120 [P.O.:120] Out: -   Intake/Output Summary (Last 24 hours) at 01/03/2023 1021 Last data filed at 01/03/2023 0900 Gross per 24 hour  Intake 734.76 ml  Output 3 ml  Net 731.76 ml   General: well-appearing, no acute distress HEENT: anicteric sclera, oropharynx clear without lesions CV: regular rate, normal rhythm, no murmurs, no gallops, no rubs, no peripheral edema Lungs: clear to auscultation bilaterally, normal work of breathing Abd: soft, non-tender, non-distended Skin: no visible lesions or rashes Psych: alert, engaged, appropriate mood and affect Musculoskeletal: no obvious deformities Neuro: normal speech, no gross focal deficits   Test Results Reviewed Lab Results  Component Value Date   NA 141 01/03/2023   K 4.2 01/03/2023   CL 114 (H) 01/03/2023   CO2 18 (L) 01/03/2023   BUN 59 (H) 01/03/2023   CREATININE 5.19 (H) 01/03/2023   GFR 74.22 03/21/2013   CALCIUM 9.2 01/03/2023   ALBUMIN 3.4 (L) 01/03/2023   PHOS 3.7 01/03/2023    CBC Recent Labs  Lab 01/02/23 1501 01/03/23 0536 01/03/23 0706  WBC 4.5 4.1  --   HGB 7.8* 6.5* 6.8*  HCT 24.5* 20.6* 21.8*  MCV 90.4 91.2  --   PLT 192 217  --     I have reviewed all relevant outside healthcare records related to the patient's current hospitalization

## 2023-01-03 NOTE — Plan of Care (Signed)
  Problem: Education: Goal: Knowledge of General Education information will improve Description Including pain rating scale, medication(s)/side effects and non-pharmacologic comfort measures Outcome: Progressing   Problem: Health Behavior/Discharge Planning: Goal: Ability to manage health-related needs will improve Outcome: Progressing   

## 2023-01-03 NOTE — Progress Notes (Signed)
Lab called with critical on pt. HGB 6.5. DO notified. New orders to repeat H&H.

## 2023-01-03 NOTE — Progress Notes (Signed)
Hemoglobin 6.7. Dr. Denton Brick notified

## 2023-01-04 DIAGNOSIS — N179 Acute kidney failure, unspecified: Secondary | ICD-10-CM | POA: Diagnosis not present

## 2023-01-04 LAB — CBC
HCT: 26.4 % — ABNORMAL LOW (ref 36.0–46.0)
Hemoglobin: 8.6 g/dL — ABNORMAL LOW (ref 12.0–15.0)
MCH: 29.2 pg (ref 26.0–34.0)
MCHC: 32.6 g/dL (ref 30.0–36.0)
MCV: 89.5 fL (ref 80.0–100.0)
Platelets: 162 10*3/uL (ref 150–400)
RBC: 2.95 MIL/uL — ABNORMAL LOW (ref 3.87–5.11)
RDW: 15 % (ref 11.5–15.5)
WBC: 5.1 10*3/uL (ref 4.0–10.5)
nRBC: 0 % (ref 0.0–0.2)

## 2023-01-04 LAB — RENAL FUNCTION PANEL
Albumin: 3.2 g/dL — ABNORMAL LOW (ref 3.5–5.0)
Anion gap: 8 (ref 5–15)
BUN: 48 mg/dL — ABNORMAL HIGH (ref 8–23)
CO2: 19 mmol/L — ABNORMAL LOW (ref 22–32)
Calcium: 9 mg/dL (ref 8.9–10.3)
Chloride: 112 mmol/L — ABNORMAL HIGH (ref 98–111)
Creatinine, Ser: 4.28 mg/dL — ABNORMAL HIGH (ref 0.44–1.00)
GFR, Estimated: 10 mL/min — ABNORMAL LOW (ref >60)
Glucose, Bld: 105 mg/dL — ABNORMAL HIGH (ref 70–99)
Phosphorus: 2.8 mg/dL (ref 2.5–4.6)
Potassium: 3.7 mmol/L (ref 3.5–5.1)
Sodium: 139 mmol/L (ref 135–145)

## 2023-01-04 LAB — BPAM RBC
Blood Product Expiration Date: 202403032359
ISSUE DATE / TIME: 202402171228
Unit Type and Rh: 6200

## 2023-01-04 LAB — GLUCOSE, CAPILLARY
Glucose-Capillary: 117 mg/dL — ABNORMAL HIGH (ref 70–99)
Glucose-Capillary: 158 mg/dL — ABNORMAL HIGH (ref 70–99)
Glucose-Capillary: 166 mg/dL — ABNORMAL HIGH (ref 70–99)
Glucose-Capillary: 186 mg/dL — ABNORMAL HIGH (ref 70–99)

## 2023-01-04 LAB — TYPE AND SCREEN
ABO/RH(D): A POS
Antibody Screen: NEGATIVE
Unit division: 0

## 2023-01-04 LAB — PTH, INTACT AND CALCIUM
Calcium, Total (PTH): 9.6 mg/dL (ref 8.7–10.3)
PTH: 8 pg/mL — ABNORMAL LOW (ref 15–65)

## 2023-01-04 LAB — MAGNESIUM: Magnesium: 1.9 mg/dL (ref 1.7–2.4)

## 2023-01-04 MED ORDER — NEPRO/CARBSTEADY PO LIQD
237.0000 mL | Freq: Three times a day (TID) | ORAL | Status: DC
  Administered 2023-01-04 – 2023-01-06 (×3): 237 mL via ORAL

## 2023-01-04 MED ORDER — FLUCONAZOLE 100 MG PO TABS
200.0000 mg | ORAL_TABLET | Freq: Every day | ORAL | Status: AC
  Administered 2023-01-04 – 2023-01-06 (×3): 200 mg via ORAL
  Filled 2023-01-04 (×3): qty 2

## 2023-01-04 MED ORDER — RENA-VITE PO TABS
1.0000 | ORAL_TABLET | Freq: Every day | ORAL | Status: DC
  Administered 2023-01-04 – 2023-01-05 (×2): 1 via ORAL
  Filled 2023-01-04 (×2): qty 1

## 2023-01-04 MED ORDER — SODIUM CHLORIDE 0.9 % IV SOLN
1.0000 g | INTRAVENOUS | Status: DC
  Administered 2023-01-04 – 2023-01-05 (×2): 1 g via INTRAVENOUS
  Filled 2023-01-04 (×2): qty 10

## 2023-01-04 MED ORDER — LACTATED RINGERS IV SOLN: INTRAVENOUS | Status: AC

## 2023-01-04 NOTE — Progress Notes (Addendum)
PROGRESS NOTE     Kelly Castillo, is a 89 y.o. female, DOB - Jan 02, 1934, [id number]  Admit date - 01/02/2023   Admitting Physician Albertine Patricia, MD  Outpatient Primary MD for the patient is Ivy Lynn, NP (Inactive)  LOS - 2  Chief Complaint  Patient presents with   Emesis        Brief Narrative:  89 y.o. female, with past medical history of hypertension, hyperlipidemia, diabetes mellitus, CAD, GERD, admitted on 01/02/2023 with AKI on CKD 3B and worsening anemia    -Assessment and Plan: 1)AKI----acute kidney injury on CKD stage -3B---suspect related to dehydration in setting of poor oral intake and emesis with diarrhea compounded by lisinopril use -   creatinine on admission=6.03  ,  baseline creatinine = 1.1 -Creatinine trending down with hydration, -Continue to hold lisinopril -Metabolic acidosis noted -Continue gentle iv hydration -Nephrology consult appreciated -Renal ultrasound without obstructive uropathy -renally adjust medications, avoid nephrotoxic agents / dehydration  / hypotension -anticipate discharge home on 01/05/2023 if renal function continues to improve  2)Acute on Chronic Symptomatic Anemia--suspect related to underlying CKD--hemoglobin was 9.7 couple months ago -Renal function is worsening and hemoglobin dropped to 6.5 -Hemoglobin up to 8.6 from 6.5 after transfusion of 1 unit of PRBC on 01/03/2023 -On 01/04/2023 patient received Procrit/Aranesp -No bleeding concerns at this time -Anemia workup including ferritin, serum iron, TIBC, folate and B12 are all largely unremarkable  3)Adult FTT-supplements and multivitamins given, consider dietitian consult  4)DM2- A1c 4.9 reflecting excellent diabetic control PTA Use Novolog/Humalog Sliding scale insulin with Accu-Cheks/Fingersticks as ordered   5)H/o CAD--chest pain-free , continue aspirin, Crestor and metoprolol  6)HTN--BP improving with medication adjustments, -Continue to hold  lisinopril due to worsening renal function  7)Presumed UTI--UA on admission suggest UTI with possible Yeast as well -Culture was Not sent on admission --pt has Dysuria and frequency -Treat empirically with Rocephin and Diflucan - Status is: Inpatient   Disposition: The patient is from: Home              Anticipated d/c is to: Home-----anticipate discharge home on 01/05/2023 if renal function continues to improve              Anticipated d/c date is: 1 day              Patient currently is not medically stable to d/c. Barriers: Not Clinically Stable-   Code Status :  -  Code Status: Full Code   Family Communication:    Discussed with daughter Kennyth Lose at bedside  DVT Prophylaxis  :   - SCDs  heparin injection 5,000 Units Start: 01/02/23 2200 SCDs Start: 01/02/23 1839   Lab Results  Component Value Date   PLT 162 01/04/2023   Inpatient Medications  Scheduled Meds:  amLODipine  10 mg Oral Daily   aspirin EC  81 mg Oral Daily   heparin  5,000 Units Subcutaneous Q8H   insulin aspart  0-5 Units Subcutaneous QHS   insulin aspart  0-9 Units Subcutaneous TID WC   LORazepam  1 mg Intravenous Once   metoprolol tartrate  25 mg Oral Daily   pantoprazole  40 mg Oral Daily   polyethylene glycol  17 g Oral BID   rosuvastatin  20 mg Oral Daily   Continuous Infusions:  lactated ringers 50 mL/hr at 01/04/23 1206   PRN Meds:.acetaminophen **OR** acetaminophen, hydrALAZINE, ondansetron **OR** ondansetron (ZOFRAN) IV   Anti-infectives (From admission, onward)    None  Subjective: Kelly Castillo today has no fevers, no emesis,  No chest pain,   -Daughter Kennyth Lose at bedside -Had episode of disorientation and agitation overnight suspect some degree of sundowning -Voiding well--pt has  Dysuria and frequency  Objective: Vitals:   01/03/23 2115 01/04/23 0340 01/04/23 0535 01/04/23 0636  BP: (!) 185/71 (!) 162/58 (!) 151/59   Pulse: 76 70 73   Resp: 20  19   Temp: 98.2 F (36.8  C)  98.6 F (37 C)   TempSrc: Oral  Oral   SpO2: 100%  100%   Weight:    40.3 kg  Height:        Intake/Output Summary (Last 24 hours) at 01/04/2023 1237 Last data filed at 01/04/2023 0600 Gross per 24 hour  Intake 1349.74 ml  Output --  Net 1349.74 ml   Filed Weights   01/02/23 1428 01/04/23 0636  Weight: 40.8 kg 40.3 kg   Physical Exam Gen:- Awake Alert, no acute distress HEENT:- [place].AT, No sclera icterus Neck-Supple Neck,No JVD,.  Lungs-  CTAB , fair symmetrical air movement CV- S1, S2 normal, regular  Abd-  +ve B.Sounds, Abd Soft, No tenderness,    Extremity/Skin:- +ve  edema, pedal pulses present  Psych-affect is appropriate, oriented x3, (episodes of disorientation and confusion and sundowning at times) Neuro-no new focal deficits, no tremors  Data Reviewed: I have personally reviewed following labs and imaging studies  CBC: Recent Labs  Lab 01/02/23 1501 01/03/23 0536 01/03/23 0706 01/04/23 0826  WBC 4.5 4.1  --  5.1  HGB 7.8* 6.5* 6.8* 8.6*  HCT 24.5* 20.6* 21.8* 26.4*  MCV 90.4 91.2  --  89.5  PLT 192 217  --  0000000   Basic Metabolic Panel: Recent Labs  Lab 01/02/23 1501 01/02/23 1803 01/03/23 0536 01/04/23 0505  NA 137  --  141 139  K 4.7  --  4.2 3.7  CL 106  --  114* 112*  CO2 19*  --  18* 19*  GLUCOSE 168*  --  97 105*  BUN 71*  --  59* 48*  CREATININE 6.03*  --  5.19* 4.28*  CALCIUM 10.6* 9.6 9.2 9.0  MG  --   --   --  1.9  PHOS  --   --  3.7 2.8   GFR: Estimated Creatinine Clearance: 5.9 mL/min (A) (by C-G formula based on SCr of 4.28 mg/dL (H)). Liver Function Tests: Recent Labs  Lab 01/02/23 1501 01/03/23 0536 01/04/23 0505  AST 15  --   --   ALT 14  --   --   ALKPHOS 32*  --   --   BILITOT 0.3  --   --   PROT 7.4  --   --   ALBUMIN 4.0 3.4* 3.2*   HbA1C: Recent Labs    01/02/23 1803  HGBA1C 4.9   Radiology Studies: US RENAL  Result Date: 01/02/2023 CLINICAL DATA:  Acute kidney injury EXAM: RENAL / URINARY TRACT  ULTRASOUND COMPLETE COMPARISON:  CT 01/02/2023 FINDINGS: Right Kidney: Renal measurements: 9.8 x 4.4 x 5.2 cm = volume: 119 mL. Echogenicity within normal limits. No mass or hydronephrosis visualized. Left Kidney: Renal measurements: 8.5 x 5.4 x 4.7 cm = volume: 112 mL. Echogenicity normal. No hydronephrosis. Cysts measuring up to 1.5 cm, no imaging follow-up is recommended. Bladder: Appears normal for degree of bladder distention. Other: None. IMPRESSION: Negative for hydronephrosis. Electronically Signed   By: Donavan Foil M.D.   On: 01/02/2023 22:23   CT  ABDOMEN PELVIS WO CONTRAST  Result Date: 01/02/2023 CLINICAL DATA:  Abdominal pain, acute, nonlocalized. Decreased oral intake this week with vomiting for a few days. EXAM: CT ABDOMEN AND PELVIS WITHOUT CONTRAST TECHNIQUE: Multidetector CT imaging of the abdomen and pelvis was performed following the standard protocol without IV contrast. RADIATION DOSE REDUCTION: This exam was performed according to the departmental dose-optimization program which includes automated exposure control, adjustment of the mA and/or kV according to patient size and/or use of iterative reconstruction technique. COMPARISON:  None Available. FINDINGS: Lower chest: Minimal atelectasis or scarring at both lung bases. Previous median sternotomy with mild aortic and coronary artery atherosclerosis. Hepatobiliary: No definite focal hepatic abnormalities are identified on noncontrast imaging. No evidence of gallstones, gallbladder wall thickening or biliary dilatation. Pancreas: Unremarkable. No pancreatic ductal dilatation or surrounding inflammatory changes. Spleen: Normal in size without focal abnormality. Adrenals/Urinary Tract: Both adrenal glands appear normal. No evidence of urinary tract calculus, suspicious renal lesion or hydronephrosis. The bladder appears unremarkable for its degree of distention. Stomach/Bowel: No enteric contrast administered. The stomach appears  unremarkable for its degree of distension. No evidence of bowel wall thickening, distention or surrounding inflammatory change. The appendix is not clearly seen, by report surgically absent. Moderate stool throughout the colon. Vascular/Lymphatic: There are no enlarged abdominal or pelvic lymph nodes. Aortic and branch vessel atherosclerosis without evidence of aneurysm. Reproductive: Hysterectomy.  No evidence of adnexal mass. Other: No evidence of abdominal wall mass or hernia. No ascites. Musculoskeletal: No acute or significant osseous findings. Multilevel lumbar spondylosis with disc degeneration and bulging at several levels. There is the potential for spinal stenosis and/or nerve root encroachment. IMPRESSION: 1. No acute findings or clear explanation for the patient's symptoms on noncontrast imaging. Solid organ assessment limited. 2. Moderate stool throughout the colon suggesting constipation. 3. Multilevel lumbar spondylosis with potential for spinal stenosis and/or nerve root encroachment. 4.  Aortic Atherosclerosis (ICD10-I70.0). Electronically Signed   By: Richardean Sale M.D.   On: 01/02/2023 16:49    Scheduled Meds:  amLODipine  10 mg Oral Daily   aspirin EC  81 mg Oral Daily   heparin  5,000 Units Subcutaneous Q8H   insulin aspart  0-5 Units Subcutaneous QHS   insulin aspart  0-9 Units Subcutaneous TID WC   LORazepam  1 mg Intravenous Once   metoprolol tartrate  25 mg Oral Daily   pantoprazole  40 mg Oral Daily   polyethylene glycol  17 g Oral BID   rosuvastatin  20 mg Oral Daily   Continuous Infusions:  lactated ringers 50 mL/hr at 01/04/23 1206    LOS: 2 days   Roxan Hockey M.D on 01/04/2023 at 12:37 PM  Go to [email or web address] - for contact info  [facility] - Office  [phone]  If 7PM-7AM, please contact night-coverage [email or web address] 01/04/2023, 12:37 PM

## 2023-01-04 NOTE — Progress Notes (Signed)
Nephrology Follow-Up Consult note   Assessment/Recommendations: Kelly Castillo is a/an 89 y.o. female with a past medical history significant for HTN, HLD, DM2, CAD, GERD who present w/ AKI    AKI on CKD 3b: Previous baseline around 1-1.3.  Last creatinine of 2 in November.  May have been dehydrated at that time as well.  Now with signs of ongoing dehydration.  Likely failure to thrive and poor p.o. intake contributing.  Also on lisinopril at home.  Urine also appears to be consistent with UTI -Creatinine improving with hydration and treatment of UTI -Holding lisinopril -Continue with IV fluids for now -Continue to monitor daily Cr, Dose meds for GFR -Monitor Daily I/Os, Daily weight  -Maintain MAP>65 for optimal renal perfusion.  -Avoid nephrotoxic medications including NSAIDs -Use synthetic opioids (Fentanyl/Dilaudid) if needed -Abdominal imaging without concern -Currently no indication for HD   UTI: Present on urinalysis.  Continue treatment per primary team   Failure to thrive/fatigue: Likely multifactorial.  Treat AKI and UTI as above and monitor response.   Anemia: Likely multifactorial.  Transfusions on 01/03/2023.  Hemoglobin improved today   DM2: Management per primary team   Metabolic acidosis: Mild with bicarb of 19.  Continue to monitor   Hypertension: Holding home lisinopril.  Continue home metoprolol.  Amlodipine was added.   Recommendations conveyed to primary service.    [facility] 01/04/2023 9:50 AM  ___________________________________________________________  CC: Fatigue  Interval History/Subjective: Patient feels well today with no complaints.  Hemoglobin improved.  Creatinine down to 4.3.   Medications:  Current Facility-Administered Medications  Medication Dose Route Frequency Provider Last Rate Last Admin   acetaminophen (TYLENOL) tablet 650 mg  650 mg Oral Q6H PRN Elgergawy, Silver Huguenin, MD       Or    acetaminophen (TYLENOL) suppository 650 mg  650 mg Rectal Q6H PRN Elgergawy, Silver Huguenin, MD       amLODipine (NORVASC) tablet 10 mg  10 mg Oral Daily Elgergawy, Silver Huguenin, MD   10 mg at 01/03/23 0954   aspirin EC tablet 81 mg  81 mg Oral Daily Elgergawy, Silver Huguenin, MD   81 mg at 01/03/23 0955   heparin injection 5,000 Units  5,000 Units Subcutaneous Q8H Elgergawy, Silver Huguenin, MD   5,000 Units at 01/04/23 [id number]   hydrALAZINE (APRESOLINE) injection 5 mg  5 mg Intravenous Q4H PRN Elgergawy, Silver Huguenin, MD       insulin aspart (novoLOG) injection 0-5 Units  0-5 Units Subcutaneous QHS Elgergawy, Silver Huguenin, MD   2 Units at 01/03/23 2153   insulin aspart (novoLOG) injection 0-9 Units  0-9 Units Subcutaneous TID WC Elgergawy, Silver Huguenin, MD       lactated ringers infusion   Intravenous Continuous Reesa Chew, MD 100 mL/hr at 01/03/23 1823 Infusion Verify at 01/03/23 1823   LORazepam (ATIVAN) injection 1 mg  1 mg Intravenous Once Zierle-Ghosh, Asia B, DO       metoprolol tartrate (LOPRESSOR) tablet 25 mg  25 mg Oral Daily Elgergawy, Silver Huguenin, MD   25 mg at 01/03/23 0955   ondansetron (ZOFRAN) tablet 4 mg  4 mg Oral Q6H PRN Elgergawy, Silver Huguenin, MD       Or   ondansetron (ZOFRAN) injection 4 mg  4 mg Intravenous Q6H PRN Elgergawy, Silver Huguenin, MD       pantoprazole (PROTONIX) EC tablet 40 mg  40 mg Oral Daily Emokpae, Courage, MD   40 mg at 01/03/23 1040  polyethylene glycol (MIRALAX / GLYCOLAX) packet 17 g  17 g Oral BID Elgergawy, Silver Huguenin, MD   17 g at 01/03/23 0953   rosuvastatin (CRESTOR) tablet 20 mg  20 mg Oral Daily Elgergawy, Silver Huguenin, MD   20 mg at 01/03/23 0955      Review of Systems: 10 systems reviewed and negative except per interval history/subjective  Physical Exam: Vitals:   01/04/23 0340 01/04/23 0535  BP: (!) 162/58 (!) 151/59  Pulse: 70 73  Resp:  19  Temp:  98.6 F (37 C)  SpO2:  100%   No intake/output data recorded.  Intake/Output Summary (Last 24 hours) at 01/04/2023 0950 Last  data filed at 01/04/2023 0600 Gross per 24 hour  Intake 1349.74 ml  Output --  Net 1349.74 ml   Constitutional: well-appearing, no acute distress ENMT: ears and nose without scars or lesions, MMM CV: normal rate, no edema Respiratory: clear to auscultation, normal work of breathing Gastrointestinal: soft, non-tender, no palpable masses or hernias Skin: no visible lesions or rashes Psych: alert, judgement/insight impaired, appropriate mood and affect   Test Results I personally reviewed new and old clinical labs and radiology tests Lab Results  Component Value Date   NA 139 01/04/2023   K 3.7 01/04/2023   CL 112 (H) 01/04/2023   CO2 19 (L) 01/04/2023   BUN 48 (H) 01/04/2023   CREATININE 4.28 (H) 01/04/2023   GFR 74.22 03/21/2013   CALCIUM 9.0 01/04/2023   ALBUMIN 3.2 (L) 01/04/2023   PHOS 2.8 01/04/2023    CBC Recent Labs  Lab 01/02/23 1501 01/03/23 0536 01/03/23 0706 01/04/23 0826  WBC 4.5 4.1  --  5.1  HGB 7.8* 6.5* 6.8* 8.6*  HCT 24.5* 20.6* 21.8* 26.4*  MCV 90.4 91.2  --  89.5  PLT 192 217  --  162

## 2023-01-05 DIAGNOSIS — N179 Acute kidney failure, unspecified: Secondary | ICD-10-CM | POA: Diagnosis not present

## 2023-01-05 LAB — RENAL FUNCTION PANEL
Albumin: 3.5 g/dL (ref 3.5–5.0)
Anion gap: 11 (ref 5–15)
BUN: 38 mg/dL — ABNORMAL HIGH (ref 8–23)
CO2: 20 mmol/L — ABNORMAL LOW (ref 22–32)
Calcium: 9.3 mg/dL (ref 8.9–10.3)
Chloride: 106 mmol/L (ref 98–111)
Creatinine, Ser: 3.42 mg/dL — ABNORMAL HIGH (ref 0.44–1.00)
GFR, Estimated: 12 mL/min — ABNORMAL LOW (ref >60)
Glucose, Bld: 171 mg/dL — ABNORMAL HIGH (ref 70–99)
Phosphorus: 2.2 mg/dL — ABNORMAL LOW (ref 2.5–4.6)
Potassium: 3.5 mmol/L (ref 3.5–5.1)
Sodium: 137 mmol/L (ref 135–145)

## 2023-01-05 LAB — HEMOGLOBIN AND HEMATOCRIT, BLOOD
HCT: 28.9 % — ABNORMAL LOW (ref 36.0–46.0)
Hemoglobin: 9.6 g/dL — ABNORMAL LOW (ref 12.0–15.0)

## 2023-01-05 LAB — GLUCOSE, CAPILLARY
Glucose-Capillary: 152 mg/dL — ABNORMAL HIGH (ref 70–99)
Glucose-Capillary: 281 mg/dL — ABNORMAL HIGH (ref 70–99)
Glucose-Capillary: 89 mg/dL (ref 70–99)
Glucose-Capillary: 271 mg/dL — ABNORMAL HIGH (ref 70–99)

## 2023-01-05 NOTE — Progress Notes (Addendum)
Initial Nutrition Assessment  DOCUMENTATION CODES:   Underweight, Non-severe (moderate) malnutrition in context of chronic illness  INTERVENTION:  Recommend liberalize diet to least restrictive given pt age and poor oral intake. Current diet has automatic 1.2 liter fluid restriction attached.  Boost Plus with meals   Food preferences obtained  Malnutrition education handout attached -AVS  NUTRITION DIAGNOSIS:   Moderate Malnutrition related to chronic illness, acute illness (acute kidney injury  on chronic CKD-3b) as evidenced by mild fat depletion, moderate fat depletion, mild muscle depletion, moderate muscle depletion.   GOAL:  Patient will meet greater than or equal to 90% of their needs   MONITOR:  PO intake, Supplement acceptance, Weight trends, Labs  REASON FOR ASSESSMENT:   Consult Assessment of nutrition requirement/status  ASSESSMENT: Patient is an underweight 89 yo female with hx of DM2, GERD, CKD-3. Presents from home with AKI on CKD-3b  Patient daughter is bedside. She stays with patient during the day and pt brother is with her at night.  Patient eats 2 meals daily breakfast and dinner. Based on diet hx -predict chronic sub-optimal nutrition intake.   She was drinking Glucerna but grew tired of them. She likes Boost vanilla - will send with trays.   Her appetite has been decreased for at least a week. Daughter says, "I was having to beg her to eat one more bite".  Patient dehydrated on admission.   Patient ate well at breakfast but no lunch. Likely not hungry given her good intake earlier. Discussed food preferences.   Patient weight at Dr appointment in November- 90.4 lb. Current weight 88 lb (40.3 kg). Patient ambulates independently per daughter.  Medications reviewed.      Latest Ref Rng & Units 01/05/2023    5:33 AM 01/04/2023    5:05 AM 01/03/2023    5:36 AM  BMP  Glucose 70 - 99 mg/dL 171  105  97   BUN 8 - 23 mg/dL 38  48  59   Creatinine 0.44  - 1.00 mg/dL 3.42  4.28  5.19   Sodium 135 - 145 mmol/L 137  139  141   Potassium 3.5 - 5.1 mmol/L 3.5  3.7  4.2   Chloride 98 - 111 mmol/L 106  112  114   CO2 22 - 32 mmol/L 20  19  18   $ Calcium 8.9 - 10.3 mg/dL 9.3  9.0  9.2       NUTRITION - FOCUSED PHYSICAL EXAM:  Flowsheet Row Most Recent Value  Orbital Region Mild depletion  Upper Arm Region Moderate depletion  Thoracic and Lumbar Region Mild depletion  Buccal Region Moderate depletion  Temple Region Mild depletion  Clavicle Bone Region Moderate depletion  Clavicle and Acromion Bone Region Mild depletion  Dorsal Hand Moderate depletion  Patellar Region Mild depletion  Anterior Thigh Region Moderate depletion  Posterior Calf Region Mild depletion  Edema (RD Assessment) None  Hair Reviewed  Eyes Reviewed  Mouth Unable to assess  Skin Reviewed  Nails Reviewed       Diet Order:   Diet Order             Diet renal/carb modified with fluid restriction Diet-HS Snack? Nothing; Fluid restriction: 1200 mL Fluid; Room service appropriate? Yes; Fluid consistency: Thin  Diet effective now                   EDUCATION NEEDS:  Education needs have been addressed  Skin:  Skin Assessment: Reviewed RN Assessment  Last BM:  2/17  Height:   Ht Readings from Last 1 Encounters:  01/02/23 5' 3"$  (1.6 m)    Weight:   Wt Readings from Last 1 Encounters:  01/04/23 40.3 kg    Ideal Body Weight:   52.2 kg  BMI:  Body mass index is 15.74 kg/m.  Estimated Nutritional Needs:   Kcal:  1500-1700  Protein:  80-85 gr  Fluid:  1200 ml daily   Colman Cater MS,RD,CSG,LDN Contact: Shea Evans

## 2023-01-05 NOTE — Progress Notes (Signed)
Mobility Specialist Progress Note:   01/05/23 1420  Mobility  Activity Transferred from bed to chair  Level of Assistance Minimal assist, patient does 75% or more  Assistive Device Four wheel walker  Distance Ambulated (ft) 2 ft  Activity Response Tolerated well  Mobility Referral Yes  $Mobility charge 1 Mobility   Pt received asleep in bed, daughter in room, encouraging mobility session. Pt was agreeable to transfer from [facility] via [facility]. MinA required to stand and MinG required to stabilize while pivoting. Tolerated well, asx throughout. Left pt in chair with daughter in room, all needs met.   Royetta Crochet Mobility Specialist Please contact via Solicitor or  Rehab office at [phone]

## 2023-01-05 NOTE — Discharge Instructions (Signed)
Nutrition Post Hospital Stay Proper nutrition can help your body recover from illness and injury.   Foods and beverages high in protein, vitamins, and minerals help rebuild muscle loss, promote healing, & reduce fall risk.   In addition to eating healthy foods, a nutrition shake is an easy, delicious way to get the nutrition you need during and after your hospital stay  It is recommended that you continue to drink 2 bottles per day of: Boost Plus for at least 1 month (30 days) after your hospital stay   Tips for adding a nutrition shake into your routine: As allowed, drink one with vitamins or medications instead of water or juice Enjoy one as a tasty mid-morning or afternoon snack Drink cold or make a milkshake out of it Drink one instead of milk with cereal or snacks Use as a coffee creamer   Available at the following grocery stores and pharmacies:           * [employer] * [employer] * [employer]  * [employer]          * [employer] * [employer]  * [facility]      * Target  * [employer]   * [facility]  * [employer]   * [facility] [phone]            For COUPONS visit: [email or web address]/join or [email or web address]   Suggested Substitutions Ensure Plus = Boost Plus = Carnation Breakfast Essentials = Boost Compact Ensure Active Clear = Boost Breeze Glucerna Shake = Boost Glucose Control = Carnation Breakfast Essentials SUGAR FREE     

## 2023-01-05 NOTE — Progress Notes (Signed)
Patient ID: Kelly Castillo, female   DOB: Oct 16, 1934, 89 y.o.   MRN: [id number] S: Received blood transfusion on 01/03/23 and had some AMS and pulled out her IV.  Doing a little better today. O:BP (!) 142/61 (BP Location: Left Arm)   Pulse 81   Temp 98.8 F (37.1 C) (Oral)   Resp 18   Ht 5' 3"$  (1.6 m)   Wt 40.3 kg   SpO2 100%   BMI 15.74 kg/m   Intake/Output Summary (Last 24 hours) at 01/05/2023 0906 Last data filed at 01/05/2023 0500 Gross per 24 hour  Intake 1097.12 ml  Output --  Net 1097.12 ml   Intake/Output: I/O last 3 completed shifts: In: 1966.7 [P.O.:300; I.V.:1566.7; IV Piggyback:100] Out: -   Intake/Output this shift:  No intake/output data recorded. Weight change:  Gen: NAD, slowed mentation CVS: RRR Resp: CTA Abd: +BS, soft, NT/ND Ext: no edema  Recent Labs  Lab 01/02/23 1501 01/02/23 1803 01/03/23 0536 01/04/23 0505 01/05/23 0533  NA 137  --  141 139 137  K 4.7  --  4.2 3.7 3.5  CL 106  --  114* 112* 106  CO2 19*  --  18* 19* 20*  GLUCOSE 168*  --  97 105* 171*  BUN 71*  --  59* 48* 38*  CREATININE 6.03*  --  5.19* 4.28* 3.42*  ALBUMIN 4.0  --  3.4* 3.2* 3.5  CALCIUM 10.6* 9.6 9.2 9.0 9.3  PHOS  --   --  3.7 2.8 2.2*  AST 15  --   --   --   --   ALT 14  --   --   --   --    Liver Function Tests: Recent Labs  Lab 01/02/23 1501 01/03/23 0536 01/04/23 0505 01/05/23 0533  AST 15  --   --   --   ALT 14  --   --   --   ALKPHOS 32*  --   --   --   BILITOT 0.3  --   --   --   PROT 7.4  --   --   --   ALBUMIN 4.0 3.4* 3.2* 3.5   Recent Labs  Lab 01/02/23 1501  LIPASE 68*   No results for input(s): "AMMONIA" in the last 168 hours. CBC: Recent Labs  Lab 01/02/23 1501 01/03/23 0536 01/03/23 0706 01/04/23 0826  WBC 4.5 4.1  --  5.1  HGB 7.8* 6.5* 6.8* 8.6*  HCT 24.5* 20.6* 21.8* 26.4*  MCV 90.4 91.2  --  89.5  PLT 192 217  --  162   Cardiac Enzymes: No results for input(s): "CKTOTAL", "CKMB", "CKMBINDEX", "TROPONINI" in the last  168 hours. CBG: Recent Labs  Lab 01/04/23 0755 01/04/23 1154 01/04/23 1706 01/04/23 2128 01/05/23 0748  GLUCAP 117* 158* 166* 186* 152*    Iron Studies:  Recent Labs    01/02/23 1803  IRON 58  TIBC 266  FERRITIN 194   Studies/Results: No results found.  amLODipine  10 mg Oral Daily   aspirin EC  81 mg Oral Daily   feeding supplement (NEPRO CARB STEADY)  237 mL Oral TID WC   fluconazole  200 mg Oral Daily   heparin  5,000 Units Subcutaneous Q8H   insulin aspart  0-5 Units Subcutaneous QHS   insulin aspart  0-9 Units Subcutaneous TID WC   LORazepam  1 mg Intravenous Once   metoprolol tartrate  25 mg Oral Daily   multivitamin  1  tablet Oral QHS   pantoprazole  40 mg Oral Daily   rosuvastatin  20 mg Oral Daily    BMET    Component Value Date/Time   NA 137 01/05/2023 0533   NA 143 10/07/2022 1106   K 3.5 01/05/2023 0533   CL 106 01/05/2023 0533   CO2 20 (L) 01/05/2023 0533   GLUCOSE 171 (H) 01/05/2023 0533   BUN 38 (H) 01/05/2023 0533   BUN 26 10/07/2022 1106   CREATININE 3.42 (H) 01/05/2023 0533   CREATININE 1.07 (H) 10/22/2015 0821   CALCIUM 9.3 01/05/2023 0533   CALCIUM 9.6 01/02/2023 1803   GFRNONAA 12 (L) 01/05/2023 0533   GFRAA 72 04/12/2020 0913   CBC    Component Value Date/Time   WBC 5.1 01/04/2023 0826   RBC 2.95 (L) 01/04/2023 0826   HGB 8.6 (L) 01/04/2023 0826   HGB 9.7 (L) 10/07/2022 1106   HCT 26.4 (L) 01/04/2023 0826   HCT 29.9 (L) 10/07/2022 1106   PLT 162 01/04/2023 0826   PLT 294 10/07/2022 1106   MCV 89.5 01/04/2023 0826   MCV 85 10/07/2022 1106   MCH 29.2 01/04/2023 0826   MCHC 32.6 01/04/2023 0826   RDW 15.0 01/04/2023 0826   RDW 15.3 10/07/2022 1106   LYMPHSABS 1.1 10/07/2022 1106   MONOABS 0.3 08/06/2015 1120   EOSABS 0.1 10/07/2022 1106   BASOSABS 0.0 10/07/2022 1106     Assessment/Plan: AKI on CKD 3b: Previous baseline around 1-1.3.  Last creatinine of 2 in November.  May have been dehydrated at that time as well.  Now  with signs of ongoing dehydration.  Likely failure to thrive and poor p.o. intake contributing.  Also on lisinopril at home.  Urine also appears to be consistent with UTI -Creatinine improving with hydration and treatment of UTI Continue to hold lisinopril and would not resume at discharge Continue with IV fluids for now Continue to monitor daily Cr, Dose meds for GFR  Monitor Daily I/Os, Daily weight  Maintain MAP>65 for optimal renal perfusion.  Avoid nephrotoxic medications including NSAIDs Use synthetic opioids (Fentanyl/Dilaudid) if needed-Abdominal imaging without concern Currently no indication for HD Will need outpatient follow up after discharge. UTI: Present on urinalysis.  Continue treatment per primary team Failure to thrive/fatigue: Likely multifactorial.  Treat AKI and UTI as above and monitor response. Anemia: Likely multifactorial.  Transfusions on 01/03/2023.  Hemoglobin improved but need to check again tomorrow. DM2: Management per primary team Metabolic acidosis: Mild with bicarb of 19.  Continue to monitor Hypertension: Holding home lisinopril.  Continue home metoprolol.  Amlodipine was added. FTT - per primary svc Disposition - possible discharge tomorrow if she continues to improve  Donetta Potts, MD [facility]

## 2023-01-05 NOTE — Progress Notes (Signed)
PROGRESS NOTE    Kelly Castillo  [id number] DOB: [birth year] DOA: 01/02/2023 PCP: Ivy Lynn, NP (Inactive)   Brief Narrative:    89 y.o. female, with past medical history of hypertension, hyperlipidemia, diabetes mellitus, CAD, GERD, admitted on 01/02/2023 with AKI on CKD 3B and worsening anemia requiring 1 unit PRBC transfusion on 2/17.  Creatinine levels are downtrending and she continues to require IV fluid.  Nephrology following.  Assessment & Plan:   Principal Problem:   AKI (acute kidney injury) ([place]) Active Problems:   Diabetes mellitus type 2 in nonobese [facility])   Essential hypertension   GERD   CAD (coronary artery disease), native coronary artery   Hyperlipidemia associated with type 2 diabetes mellitus ([place])  Assessment and Plan:   1)AKI----acute kidney injury on CKD stage -3B---suspect related to dehydration in setting of poor oral intake and emesis with diarrhea compounded by lisinopril use -   creatinine on admission=6.03  ,  baseline creatinine = 1.1 -Creatinine trending down with hydration, -Continue to hold lisinopril -Metabolic acidosis noted -Continue gentle iv hydration -Nephrology consult appreciated -Renal ultrasound without obstructive uropathy -renally adjust medications, avoid nephrotoxic agents / dehydration  / hypotension -anticipate discharge home on 01/06/2023 if renal function continues to improve   2)Acute on Chronic Symptomatic Anemia--suspect related to underlying CKD--hemoglobin was 9.7 couple months ago -Renal function is worsening and hemoglobin dropped to 6.5 -Hemoglobin up to 9.6 from 6.5 after transfusion of 1 unit of PRBC on 01/03/2023 -On 01/04/2023 patient received Procrit/Aranesp -No bleeding concerns at this time -Anemia workup including ferritin, serum iron, TIBC, folate and B12 are all largely unremarkable   3)Adult FTT-supplements and multivitamins given, consider dietitian consult   4)DM2- A1c 4.9 reflecting  excellent diabetic control PTA Use Novolog/Humalog Sliding scale insulin with Accu-Cheks/Fingersticks as ordered    5)H/o CAD--chest pain-free , continue aspirin, Crestor and metoprolol   6)HTN--BP improving with medication adjustments, -Continue to hold lisinopril due to worsening renal function   7)Presumed UTI--UA on admission suggest UTI with possible Yeast as well -Culture was Not sent on admission --pt has Dysuria and frequency -Treat empirically with Rocephin and Diflucan, day 2/3    DVT prophylaxis: Heparin Code Status: Full Family Communication: Daughter, Kennyth Lose at bedside 2/19 Disposition Plan:  Status is: Inpatient Remains inpatient appropriate because: Need for ongoing IV fluid  Consultants:  Nephrology  Procedures:  None  Antimicrobials:  Anti-infectives (From admission, onward)    Start     Dose/Rate Route Frequency Ordered Stop   01/04/23 1330  fluconazole (DIFLUCAN) tablet 200 mg        200 mg Oral Daily 01/04/23 1238 01/07/23 0959   01/04/23 1330  cefTRIAXone (ROCEPHIN) 1 g in sodium chloride 0.9 % 100 mL IVPB        1 g 200 mL/hr over 30 Minutes Intravenous Every 24 hours 01/04/23 1238 01/07/23 1329       Subjective: Patient seen and evaluated today with no new acute complaints or concerns. No acute concerns or events noted overnight.  Continues to remain somewhat somnolent and daughter is at bedside.  Appears to be eating and drinking well.  Objective: Vitals:   01/04/23 1431 01/04/23 1958 01/05/23 0517 01/05/23 0818  BP: (!) 156/53 134/77 (!) 163/61 (!) 142/61  Pulse: 73 86 76 81  Resp: 14 16 14 18  $ Temp: 99.5 F (37.5 C) 99.6 F (37.6 C) 99.2 F (37.3 C) 98.8 F (37.1 C)  TempSrc: Oral Oral Oral Oral  SpO2: 99% 99%  100% 100%  Weight:      Height:        Intake/Output Summary (Last 24 hours) at 01/05/2023 1114 Last data filed at 01/05/2023 1023 Gross per 24 hour  Intake 1536.89 ml  Output --  Net 1536.89 ml   Filed Weights    01/02/23 1428 01/04/23 0636  Weight: 40.8 kg 40.3 kg    Examination:  General exam: Appears calm and comfortable  Respiratory system: Clear to auscultation. Respiratory effort normal. Cardiovascular system: S1 & S2 heard, RRR.  Gastrointestinal system: Abdomen is soft Central nervous system: Alert and awake Extremities: No edema Skin: No significant lesions noted Psychiatry: Flat affect.    Data Reviewed: I have personally reviewed following labs and imaging studies  CBC: Recent Labs  Lab 01/02/23 1501 01/03/23 0536 01/03/23 0706 01/04/23 0826 01/05/23 0946  WBC 4.5 4.1  --  5.1  --   HGB 7.8* 6.5* 6.8* 8.6* 9.6*  HCT 24.5* 20.6* 21.8* 26.4* 28.9*  MCV 90.4 91.2  --  89.5  --   PLT 192 217  --  162  --    Basic Metabolic Panel: Recent Labs  Lab 01/02/23 1501 01/02/23 1803 01/03/23 0536 01/04/23 0505 01/05/23 0533  NA 137  --  141 139 137  K 4.7  --  4.2 3.7 3.5  CL 106  --  114* 112* 106  CO2 19*  --  18* 19* 20*  GLUCOSE 168*  --  97 105* 171*  BUN 71*  --  59* 48* 38*  CREATININE 6.03*  --  5.19* 4.28* 3.42*  CALCIUM 10.6* 9.6 9.2 9.0 9.3  MG  --   --   --  1.9  --   PHOS  --   --  3.7 2.8 2.2*   GFR: Estimated Creatinine Clearance: 7.4 mL/min (A) (by C-G formula based on SCr of 3.42 mg/dL (H)). Liver Function Tests: Recent Labs  Lab 01/02/23 1501 01/03/23 0536 01/04/23 0505 01/05/23 0533  AST 15  --   --   --   ALT 14  --   --   --   ALKPHOS 32*  --   --   --   BILITOT 0.3  --   --   --   PROT 7.4  --   --   --   ALBUMIN 4.0 3.4* 3.2* 3.5   Recent Labs  Lab 01/02/23 1501  LIPASE 68*   No results for input(s): "AMMONIA" in the last 168 hours. Coagulation Profile: Recent Labs  Lab 01/03/23 0536  INR 1.1   Cardiac Enzymes: No results for input(s): "CKTOTAL", "CKMB", "CKMBINDEX", "TROPONINI" in the last 168 hours. BNP (last 3 results) No results for input(s): "PROBNP" in the last 8760 hours. HbA1C: Recent Labs    01/02/23 1803   HGBA1C 4.9   CBG: Recent Labs  Lab 01/04/23 0755 01/04/23 1154 01/04/23 1706 01/04/23 2128 01/05/23 0748  GLUCAP 117* 158* 166* 186* 152*   Lipid Profile: No results for input(s): "CHOL", "HDL", "LDLCALC", "TRIG", "CHOLHDL", "LDLDIRECT" in the last 72 hours. Thyroid Function Tests: No results for input(s): "TSH", "T4TOTAL", "FREET4", "T3FREE", "THYROIDAB" in the last 72 hours. Anemia Panel: Recent Labs    01/02/23 1803  VITAMINB12 958*  FOLATE 10.2  FERRITIN 194  TIBC 266  IRON 58  RETICCTPCT 0.8   Sepsis Labs: No results for input(s): "PROCALCITON", "LATICACIDVEN" in the last 168 hours.  No results found for this or any previous visit (from the past 240 hour(s)).  Radiology Studies: No results found.      Scheduled Meds:  amLODipine  10 mg Oral Daily   aspirin EC  81 mg Oral Daily   feeding supplement (NEPRO CARB STEADY)  237 mL Oral TID WC   fluconazole  200 mg Oral Daily   heparin  5,000 Units Subcutaneous Q8H   insulin aspart  0-5 Units Subcutaneous QHS   insulin aspart  0-9 Units Subcutaneous TID WC   LORazepam  1 mg Intravenous Once   metoprolol tartrate  25 mg Oral Daily   multivitamin  1 tablet Oral QHS   pantoprazole  40 mg Oral Daily   rosuvastatin  20 mg Oral Daily   Continuous Infusions:  cefTRIAXone (ROCEPHIN)  IV Stopped (01/04/23 1723)     LOS: 3 days    Time spent: 35 minutes    Brigham Cobbins Darleen Crocker, DO [facility]  If 7PM-7AM, please contact night-coverage [email or web address] 01/05/2023, 11:14 AM

## 2023-01-05 NOTE — TOC Progression Note (Signed)
Transition of Care [facility]) - Progression Note    Patient Details  Name: Kelly Castillo MRN: [id number] Date of Birth: 07/20/1934  Transition of Care [facility]) CM/SW Contact  Salome Arnt, Braden Phone Number: 01/05/2023, 10:26 AM  Clinical Narrative:  Transition of Care [facility]) Screening Note   Patient Details  Name: Kelly Castillo Date of Birth: 11-11-1934   Transition of Care [facility]) CM/SW Contact:    Salome Arnt, LCSW Phone Number: 01/05/2023, 10:26 AM    Transition of Care Department [facility]) has reviewed patient and no TOC needs have been identified at this time. We will continue to monitor patient advancement through interdisciplinary progression rounds. If new patient transition needs arise, please place a TOC consult.             Expected Discharge Plan and Services                                               Social Determinants of Health (SDOH) Interventions SDOH Screenings   Food Insecurity: No Food Insecurity (01/02/2023)  Housing: Low Risk  (01/02/2023)  Transportation Needs: No Transportation Needs (01/02/2023)  Utilities: Not At Risk (01/02/2023)  Alcohol Screen: Low Risk  (09/23/2022)  Depression (PHQ2-9): Low Risk  (10/07/2022)  Financial Resource Strain: Low Risk  (09/23/2022)  Physical Activity: Insufficiently Active (09/23/2022)  Social Connections: Moderately Isolated (09/23/2022)  Stress: No Stress Concern Present (09/23/2022)  Tobacco Use: Low Risk  (01/02/2023)    Readmission Risk Interventions     No data to display

## 2023-01-06 DIAGNOSIS — E440 Moderate protein-calorie malnutrition: Secondary | ICD-10-CM | POA: Insufficient documentation

## 2023-01-06 DIAGNOSIS — N179 Acute kidney failure, unspecified: Secondary | ICD-10-CM | POA: Diagnosis not present

## 2023-01-06 LAB — RENAL FUNCTION PANEL
Albumin: 3 g/dL — ABNORMAL LOW (ref 3.5–5.0)
Anion gap: 9 (ref 5–15)
BUN: 31 mg/dL — ABNORMAL HIGH (ref 8–23)
CO2: 22 mmol/L (ref 22–32)
Calcium: 8.8 mg/dL — ABNORMAL LOW (ref 8.9–10.3)
Chloride: 106 mmol/L (ref 98–111)
Creatinine, Ser: 3.19 mg/dL — ABNORMAL HIGH (ref 0.44–1.00)
GFR, Estimated: 14 mL/min — ABNORMAL LOW (ref >60)
Glucose, Bld: 176 mg/dL — ABNORMAL HIGH (ref 70–99)
Phosphorus: 2.3 mg/dL — ABNORMAL LOW (ref 2.5–4.6)
Potassium: 3.2 mmol/L — ABNORMAL LOW (ref 3.5–5.1)
Sodium: 137 mmol/L (ref 135–145)

## 2023-01-06 LAB — CBC WITH DIFFERENTIAL/PLATELET
Abs Immature Granulocytes: 0.02 10*3/uL (ref 0.00–0.07)
Basophils Absolute: 0 10*3/uL (ref 0.0–0.1)
Basophils Relative: 0 %
Eosinophils Absolute: 0.1 10*3/uL (ref 0.0–0.5)
Eosinophils Relative: 2 %
HCT: 24.4 % — ABNORMAL LOW (ref 36.0–46.0)
Hemoglobin: 8 g/dL — ABNORMAL LOW (ref 12.0–15.0)
Immature Granulocytes: 0 %
Lymphocytes Relative: 22 %
Lymphs Abs: 1.2 10*3/uL (ref 0.7–4.0)
MCH: 29.1 pg (ref 26.0–34.0)
MCHC: 32.8 g/dL (ref 30.0–36.0)
MCV: 88.7 fL (ref 80.0–100.0)
Monocytes Absolute: 0.6 10*3/uL (ref 0.1–1.0)
Monocytes Relative: 11 %
Neutro Abs: 3.5 10*3/uL (ref 1.7–7.7)
Neutrophils Relative %: 65 %
Platelets: 154 10*3/uL (ref 150–400)
RBC: 2.75 MIL/uL — ABNORMAL LOW (ref 3.87–5.11)
RDW: 14.9 % (ref 11.5–15.5)
WBC: 5.3 10*3/uL (ref 4.0–10.5)
nRBC: 0 % (ref 0.0–0.2)

## 2023-01-06 LAB — GLUCOSE, CAPILLARY: Glucose-Capillary: 166 mg/dL — ABNORMAL HIGH (ref 70–99)

## 2023-01-06 MED ORDER — SITAGLIPTIN PHOSPHATE 25 MG PO TABS: 25.0000 mg | ORAL_TABLET | Freq: Every day | ORAL | 0 refills | Status: DC

## 2023-01-06 MED ORDER — AMLODIPINE BESYLATE 10 MG PO TABS: 10.0000 mg | ORAL_TABLET | Freq: Every day | ORAL | 0 refills | Status: DC

## 2023-01-06 MED ORDER — RENA-VITE PO TABS: 1.0000 | ORAL_TABLET | Freq: Every day | ORAL | 0 refills | Status: DC

## 2023-01-06 MED ORDER — NEPRO/CARBSTEADY PO LIQD: 237.0000 mL | Freq: Three times a day (TID) | ORAL | 0 refills | Status: DC

## 2023-01-06 MED ORDER — POTASSIUM CHLORIDE CRYS ER 20 MEQ PO TBCR
20.0000 meq | EXTENDED_RELEASE_TABLET | Freq: Once | ORAL | Status: AC
  Administered 2023-01-06: 20 meq via ORAL
  Filled 2023-01-06: qty 1

## 2023-01-06 NOTE — Care Management Important Message (Signed)
Important Message  Patient Details  Name: Kelly Castillo MRN: [id number] Date of Birth: [birth year]   Medicare Important Message Given:  Yes     Tommy Medal 01/06/2023, 11:14 AM

## 2023-01-06 NOTE — Discharge Summary (Signed)
Physician Discharge Summary  Kelly Castillo [id number] DOB: 11-25-1933 DOA: 01/02/2023  PCP: Ivy Lynn, NP (Inactive)  Admit date: 01/02/2023  Discharge date: 01/06/2023  Admitted From:Home  Disposition:  Home  Recommendations for Outpatient Follow-up:  Follow up with PCP in 1-2 weeks Follow-up with nephrology will be scheduled outpatient Plan to repeat lab work with BMP and CBC in 1 week Continue on amlodipine for blood pressure management and hold lisinopril Hold metformin and glimepiride and use Januvia as prescribed, monitoring blood glucose carefully at home Continue other home medications as prior Patient completed course of Diflucan and Rocephin for 3 days for UTI with presumed yeast infection.  Home Health:None  Equipment/Devices:None  Discharge Condition:Stable  CODE STATUS: Full  Diet recommendation: Heart Healthy/Carb Modified  Brief/Interim Summary: 89 y.o. female, with past medical history of hypertension, hyperlipidemia, diabetes mellitus, CAD, GERD, admitted on 01/02/2023 with AKI on CKD 3B and worsening anemia requiring 1 unit PRBC transfusion on 2/17.   No overt bleeding was noted and this was suspected to be related to underlying CKD.  Creatinine levels have continued to downtrend with the use of IV fluid and are at an acceptable level for discharge per nephrology who has also been following.  Recommendations are to hold off on use of lisinopril and metformin and patient will have close follow-up outpatient with nephrology which will be scheduled.  She was also noted to have UTI with possible yeast infection as well and was treated with Diflucan and Rocephin and had completed total 3-day course of treatment with no further need for antibiotics.  Other recommendations as noted above.  No other acute events noted and she is stable for discharge.  Discharge Diagnoses:  Principal Problem:   AKI (acute kidney injury) ([place]) Active Problems:   Diabetes  mellitus type 2 in nonobese [facility])   Essential hypertension   GERD   CAD (coronary artery disease), native coronary artery   Hyperlipidemia associated with type 2 diabetes mellitus ([place])   Malnutrition of moderate degree  Principal discharge diagnosis: AKI on CKD stage IIIb-prerenal in the setting of poor oral intake as well as diarrhea/nausea/vomiting in the setting of ACE inhibitor use.  Acute on chronic symptomatic anemia in the setting of CKD requiring 1 unit PRBC transfusion.  Presumed UTI.  Discharge Instructions  Discharge Instructions     Diet - low sodium heart healthy   Complete by: As directed    Increase activity slowly   Complete by: As directed       Allergies as of 01/06/2023       Reactions   Penicillins Hives        Medication List     STOP taking these medications    glimepiride 4 MG tablet Commonly known as: AMARYL   lisinopril 40 MG tablet Commonly known as: ZESTRIL   metFORMIN 500 MG tablet Commonly known as: GLUCOPHAGE       TAKE these medications    acetaminophen 325 MG tablet Commonly known as: TYLENOL Take 650 mg by mouth as needed.   amLODipine 10 MG tablet Commonly known as: NORVASC Take 1 tablet (10 mg total) by mouth daily. Start taking on: January 07, 2023   aspirin EC 81 MG tablet Take 1 tablet (81 mg total) by mouth daily.   blood glucose meter kit and supplies May test up to 4 times a day. E11.9   feeding supplement (NEPRO CARB STEADY) Liqd Take 237 mLs by mouth 3 (three) times daily with meals.  FeroSul 325 (65 FE) MG tablet Generic drug: ferrous sulfate TAKE ONE TABLET ONCE DAILY   fish oil-omega-3 fatty acids 1000 MG capsule Take 2 capsules (2 g total) by mouth daily.   metoprolol tartrate 25 MG tablet Commonly known as: LOPRESSOR TAKE (1) TABLET TWICE A DAY. What changed:  how much to take how to take this when to take this   multivitamin Tabs tablet Take 1 tablet by mouth at bedtime.   OneTouch  Verio test strip Generic drug: glucose blood 1 each by Other route daily.   rosuvastatin 20 MG tablet Commonly known as: CRESTOR Take 1 tablet (20 mg total) by mouth daily.   sitaGLIPtin 25 MG tablet Commonly known as: Januvia Take 1 tablet (25 mg total) by mouth daily.   Vitamin D3 125 MCG (5000 UT) Tabs Generic drug: Cholecalciferol Take 1 tablet by mouth daily.        Follow-up Information     Ivy Lynn, NP. Schedule an appointment as soon as possible for a visit in 1 week(s).   Specialty: Nurse Practitioner Contact information: [place] [address] [phone]                Allergies  Allergen Reactions   Penicillins Hives    Consultations: Nephrology   Procedures/Studies: US RENAL  Result Date: 01/02/2023 CLINICAL DATA:  Acute kidney injury EXAM: RENAL / URINARY TRACT ULTRASOUND COMPLETE COMPARISON:  CT 01/02/2023 FINDINGS: Right Kidney: Renal measurements: 9.8 x 4.4 x 5.2 cm = volume: 119 mL. Echogenicity within normal limits. No mass or hydronephrosis visualized. Left Kidney: Renal measurements: 8.5 x 5.4 x 4.7 cm = volume: 112 mL. Echogenicity normal. No hydronephrosis. Cysts measuring up to 1.5 cm, no imaging follow-up is recommended. Bladder: Appears normal for degree of bladder distention. Other: None. IMPRESSION: Negative for hydronephrosis. Electronically Signed   By: Donavan Foil M.D.   On: 01/02/2023 22:23   CT ABDOMEN PELVIS WO CONTRAST  Result Date: 01/02/2023 CLINICAL DATA:  Abdominal pain, acute, nonlocalized. Decreased oral intake this week with vomiting for a few days. EXAM: CT ABDOMEN AND PELVIS WITHOUT CONTRAST TECHNIQUE: Multidetector CT imaging of the abdomen and pelvis was performed following the standard protocol without IV contrast. RADIATION DOSE REDUCTION: This exam was performed according to the departmental dose-optimization program which includes automated exposure control, adjustment of the mA and/or kV  according to patient size and/or use of iterative reconstruction technique. COMPARISON:  None Available. FINDINGS: Lower chest: Minimal atelectasis or scarring at both lung bases. Previous median sternotomy with mild aortic and coronary artery atherosclerosis. Hepatobiliary: No definite focal hepatic abnormalities are identified on noncontrast imaging. No evidence of gallstones, gallbladder wall thickening or biliary dilatation. Pancreas: Unremarkable. No pancreatic ductal dilatation or surrounding inflammatory changes. Spleen: Normal in size without focal abnormality. Adrenals/Urinary Tract: Both adrenal glands appear normal. No evidence of urinary tract calculus, suspicious renal lesion or hydronephrosis. The bladder appears unremarkable for its degree of distention. Stomach/Bowel: No enteric contrast administered. The stomach appears unremarkable for its degree of distension. No evidence of bowel wall thickening, distention or surrounding inflammatory change. The appendix is not clearly seen, by report surgically absent. Moderate stool throughout the colon. Vascular/Lymphatic: There are no enlarged abdominal or pelvic lymph nodes. Aortic and branch vessel atherosclerosis without evidence of aneurysm. Reproductive: Hysterectomy.  No evidence of adnexal mass. Other: No evidence of abdominal wall mass or hernia. No ascites. Musculoskeletal: No acute or significant osseous findings. Multilevel lumbar spondylosis with disc degeneration  and bulging at several levels. There is the potential for spinal stenosis and/or nerve root encroachment. IMPRESSION: 1. No acute findings or clear explanation for the patient's symptoms on noncontrast imaging. Solid organ assessment limited. 2. Moderate stool throughout the colon suggesting constipation. 3. Multilevel lumbar spondylosis with potential for spinal stenosis and/or nerve root encroachment. 4.  Aortic Atherosclerosis (ICD10-I70.0). Electronically Signed   By: Richardean Sale M.D.   On: 01/02/2023 16:49     Discharge Exam: Vitals:   01/05/23 2141 01/06/23 0458  BP: (!) 154/56 118/68  Pulse: 79 71  Resp: 16 18  Temp: 98.4 F (36.9 C) (!) 97.1 F (36.2 C)  SpO2: 100% 100%   Vitals:   01/05/23 0818 01/05/23 1706 01/05/23 2141 01/06/23 0458  BP: (!) 142/61 (!) 154/62 (!) 154/56 118/68  Pulse: 81 73 79 71  Resp: 18  16 18  $ Temp: 98.8 F (37.1 C) 98.7 F (37.1 C) 98.4 F (36.9 C) (!) 97.1 F (36.2 C)  TempSrc: Oral Oral    SpO2: 100% 100% 100% 100%  Weight:      Height:        General: Pt is alert, awake, not in acute distress Cardiovascular: RRR, S1/S2 +, no rubs, no gallops Respiratory: CTA bilaterally, no wheezing, no rhonchi Abdominal: Soft, NT, ND, bowel sounds + Extremities: no edema, no cyanosis    The results of significant diagnostics from this hospitalization (including imaging, microbiology, ancillary and laboratory) are listed below for reference.     Microbiology: No results found for this or any previous visit (from the past 240 hour(s)).   Labs: BNP (last 3 results) No results for input(s): "BNP" in the last 8760 hours. Basic Metabolic Panel: Recent Labs  Lab 01/02/23 1501 01/02/23 1803 01/03/23 0536 01/04/23 0505 01/05/23 0533 01/06/23 0425  NA 137  --  141 139 137 137  K 4.7  --  4.2 3.7 3.5 3.2*  CL 106  --  114* 112* 106 106  CO2 19*  --  18* 19* 20* 22  GLUCOSE 168*  --  97 105* 171* 176*  BUN 71*  --  59* 48* 38* 31*  CREATININE 6.03*  --  5.19* 4.28* 3.42* 3.19*  CALCIUM 10.6* 9.6 9.2 9.0 9.3 8.8*  MG  --   --   --  1.9  --   --   PHOS  --   --  3.7 2.8 2.2* 2.3*   Liver Function Tests: Recent Labs  Lab 01/02/23 1501 01/03/23 0536 01/04/23 0505 01/05/23 0533 01/06/23 0425  AST 15  --   --   --   --   ALT 14  --   --   --   --   ALKPHOS 32*  --   --   --   --   BILITOT 0.3  --   --   --   --   PROT 7.4  --   --   --   --   ALBUMIN 4.0 3.4* 3.2* 3.5 3.0*   Recent Labs  Lab  01/02/23 1501  LIPASE 68*   No results for input(s): "AMMONIA" in the last 168 hours. CBC: Recent Labs  Lab 01/02/23 1501 01/03/23 0536 01/03/23 0706 01/04/23 0826 01/05/23 0946 01/06/23 0425  WBC 4.5 4.1  --  5.1  --  5.3  NEUTROABS  --   --   --   --   --  3.5  HGB 7.8* 6.5* 6.8* 8.6* 9.6* 8.0*  HCT  24.5* 20.6* 21.8* 26.4* 28.9* 24.4*  MCV 90.4 91.2  --  89.5  --  88.7  PLT 192 217  --  162  --  154   Cardiac Enzymes: No results for input(s): "CKTOTAL", "CKMB", "CKMBINDEX", "TROPONINI" in the last 168 hours. BNP: Invalid input(s): "POCBNP" CBG: Recent Labs  Lab 01/05/23 0748 01/05/23 1129 01/05/23 1707 01/05/23 2143 01/06/23 0734  GLUCAP 152* 281* 89 271* 166*   D-Dimer No results for input(s): "DDIMER" in the last 72 hours. Hgb A1c No results for input(s): "HGBA1C" in the last 72 hours. Lipid Profile No results for input(s): "CHOL", "HDL", "LDLCALC", "TRIG", "CHOLHDL", "LDLDIRECT" in the last 72 hours. Thyroid function studies No results for input(s): "TSH", "T4TOTAL", "T3FREE", "THYROIDAB" in the last 72 hours.  Invalid input(s): "FREET3" Anemia work up No results for input(s): "VITAMINB12", "FOLATE", "FERRITIN", "TIBC", "IRON", "RETICCTPCT" in the last 72 hours. Urinalysis    Component Value Date/Time   COLORURINE YELLOW 01/02/2023 1730   APPEARANCEUR CLOUDY (A) 01/02/2023 1730   LABSPEC 1.010 01/02/2023 1730   PHURINE 5.0 01/02/2023 1730   GLUCOSEU NEGATIVE 01/02/2023 1730   HGBUR NEGATIVE 01/02/2023 1730   BILIRUBINUR NEGATIVE 01/02/2023 1730   KETONESUR NEGATIVE 01/02/2023 1730   PROTEINUR 100 (A) 01/02/2023 1730   UROBILINOGEN 0.2 08/09/2015 1615   NITRITE NEGATIVE 01/02/2023 1730   LEUKOCYTESUR LARGE (A) 01/02/2023 1730   Sepsis Labs Recent Labs  Lab 01/02/23 1501 01/03/23 0536 01/04/23 0826 01/06/23 0425  WBC 4.5 4.1 5.1 5.3   Microbiology No results found for this or any previous visit (from the past 240 hour(s)).   Time  coordinating discharge: 35 minutes  SIGNED:   Rodena Goldmann, DO [facility] 01/06/2023, 10:53 AM  If 7PM-7AM, please contact night-coverage [email or web address]

## 2023-01-06 NOTE — Progress Notes (Signed)
Patient ID: Kelly Castillo, female   DOB: 02/26/1934, 89 y.o.   MRN: [id number] S: Pt seen up in bedroom walking with walker.  Feels much better. O:BP 118/68 (BP Location: Left Arm)   Pulse 71   Temp (!) 97.1 F (36.2 C)   Resp 18   Ht 5' 3"$  (1.6 m)   Wt 40.3 kg   SpO2 100%   BMI 15.74 kg/m   Intake/Output Summary (Last 24 hours) at 01/06/2023 1001 Last data filed at 01/06/2023 0500 Gross per 24 hour  Intake 700.07 ml  Output 3 ml  Net 697.07 ml   Intake/Output: I/O last 3 completed shifts: In: 1854.2 [P.O.:840; I.V.:814.1; IV Piggyback:200.1] Out: 3 [Urine:2; Stool:1]  Intake/Output this shift:  No intake/output data recorded. Weight change:  Gen: NAD CVS: RRR Resp:CTA Abd: +_BS, soft, NT/ND Ext: no edema  Recent Labs  Lab 01/02/23 1501 01/02/23 1803 01/03/23 0536 01/04/23 0505 01/05/23 0533 01/06/23 0425  NA 137  --  141 139 137 137  K 4.7  --  4.2 3.7 3.5 3.2*  CL 106  --  114* 112* 106 106  CO2 19*  --  18* 19* 20* 22  GLUCOSE 168*  --  97 105* 171* 176*  BUN 71*  --  59* 48* 38* 31*  CREATININE 6.03*  --  5.19* 4.28* 3.42* 3.19*  ALBUMIN 4.0  --  3.4* 3.2* 3.5 3.0*  CALCIUM 10.6* 9.6 9.2 9.0 9.3 8.8*  PHOS  --   --  3.7 2.8 2.2* 2.3*  AST 15  --   --   --   --   --   ALT 14  --   --   --   --   --    Liver Function Tests: Recent Labs  Lab 01/02/23 1501 01/03/23 0536 01/04/23 0505 01/05/23 0533 01/06/23 0425  AST 15  --   --   --   --   ALT 14  --   --   --   --   ALKPHOS 32*  --   --   --   --   BILITOT 0.3  --   --   --   --   PROT 7.4  --   --   --   --   ALBUMIN 4.0   < > 3.2* 3.5 3.0*   < > = values in this interval not displayed.   Recent Labs  Lab 01/02/23 1501  LIPASE 68*   No results for input(s): "AMMONIA" in the last 168 hours. CBC: Recent Labs  Lab 01/02/23 1501 01/03/23 0536 01/03/23 0706 01/04/23 0826 01/05/23 0946 01/06/23 0425  WBC 4.5 4.1  --  5.1  --  5.3  NEUTROABS  --   --   --   --   --  3.5  HGB 7.8* 6.5*    < > 8.6* 9.6* 8.0*  HCT 24.5* 20.6*   < > 26.4* 28.9* 24.4*  MCV 90.4 91.2  --  89.5  --  88.7  PLT 192 217  --  162  --  154   < > = values in this interval not displayed.   Cardiac Enzymes: No results for input(s): "CKTOTAL", "CKMB", "CKMBINDEX", "TROPONINI" in the last 168 hours. CBG: Recent Labs  Lab 01/05/23 0748 01/05/23 1129 01/05/23 1707 01/05/23 2143 01/06/23 0734  GLUCAP 152* 281* 89 271* 166*    Iron Studies: No results for input(s): "IRON", "TIBC", "TRANSFERRIN", "FERRITIN" in the last 72 hours. Studies/Results:  No results found.  amLODipine  10 mg Oral Daily   aspirin EC  81 mg Oral Daily   feeding supplement (NEPRO CARB STEADY)  237 mL Oral TID WC   fluconazole  200 mg Oral Daily   heparin  5,000 Units Subcutaneous Q8H   insulin aspart  0-5 Units Subcutaneous QHS   insulin aspart  0-9 Units Subcutaneous TID WC   LORazepam  1 mg Intravenous Once   metoprolol tartrate  25 mg Oral Daily   multivitamin  1 tablet Oral QHS   pantoprazole  40 mg Oral Daily   potassium chloride  20 mEq Oral Once   rosuvastatin  20 mg Oral Daily    BMET    Component Value Date/Time   NA 137 01/06/2023 0425   NA 143 10/07/2022 1106   K 3.2 (L) 01/06/2023 0425   CL 106 01/06/2023 0425   CO2 22 01/06/2023 0425   GLUCOSE 176 (H) 01/06/2023 0425   BUN 31 (H) 01/06/2023 0425   BUN 26 10/07/2022 1106   CREATININE 3.19 (H) 01/06/2023 0425   CREATININE 1.07 (H) 10/22/2015 0821   CALCIUM 8.8 (L) 01/06/2023 0425   CALCIUM 9.6 01/02/2023 1803   GFRNONAA 14 (L) 01/06/2023 0425   GFRAA 72 04/12/2020 0913   CBC    Component Value Date/Time   WBC 5.3 01/06/2023 0425   RBC 2.75 (L) 01/06/2023 0425   HGB 8.0 (L) 01/06/2023 0425   HGB 9.7 (L) 10/07/2022 1106   HCT 24.4 (L) 01/06/2023 0425   HCT 29.9 (L) 10/07/2022 1106   PLT 154 01/06/2023 0425   PLT 294 10/07/2022 1106   MCV 88.7 01/06/2023 0425   MCV 85 10/07/2022 1106   MCH 29.1 01/06/2023 0425   MCHC 32.8 01/06/2023 0425    RDW 14.9 01/06/2023 0425   RDW 15.3 10/07/2022 1106   LYMPHSABS 1.2 01/06/2023 0425   LYMPHSABS 1.1 10/07/2022 1106   MONOABS 0.6 01/06/2023 0425   EOSABS 0.1 01/06/2023 0425   EOSABS 0.1 10/07/2022 1106   BASOSABS 0.0 01/06/2023 0425   BASOSABS 0.0 10/07/2022 1106   Assessment/Plan: AKI on CKD 3b: Previous baseline around 1-1.3.  Last creatinine of 2 in November.  May have been dehydrated at that time as well.  Now with signs of ongoing dehydration.  Likely failure to thrive and poor p.o. intake contributing.  Also on lisinopril at home.  Urine also appears to be consistent with UTI -Creatinine improving with hydration and treatment of UTI.  Nothing further to add.  Will sign off and schedule outpatient follow up after discharge. Continue to hold lisinopril and metformin and would not resume at discharge Continue with IV fluids for now Continue to monitor daily Cr, Dose meds for GFR  Monitor Daily I/Os, Daily weight  Maintain MAP>65 for optimal renal perfusion.  Avoid nephrotoxic medications including NSAIDs Use synthetic opioids (Fentanyl/Dilaudid) if needed-Abdominal imaging without concern Currently no indication for HD Will need outpatient follow up after discharge. UTI: Present on urinalysis.  Continue treatment per primary team Failure to thrive/fatigue: Likely multifactorial.  Treat AKI and UTI as above and monitor response. Anemia: Likely multifactorial.  Transfusions on 01/03/2023.  Hemoglobin improved but need to check again tomorrow. DM2: Management per primary team Metabolic acidosis: Mild with bicarb of 19.  Continue to monitor Hypertension: Holding home lisinopril.  Continue home metoprolol.  Amlodipine was added. FTT - per primary svc Disposition - stable from renal standpoint for discharge today.  Do not resume lisinopril or metformin  at discharge.  Will arrange for outpatient follow up with our office after discharge.   Donetta Potts, MD [facility]

## 2023-01-06 NOTE — Progress Notes (Addendum)
Patient slept most of the night during this shift. Patient was up to the restroom x3 with assistance and rolling walker. Patient daughter remains at bedside. One prn medication given. Plan of care ongoing.

## 2023-01-07 ENCOUNTER — Telehealth: Payer: Self-pay

## 2023-01-07 NOTE — Transitions of Care (Post Inpatient/ED Visit) (Signed)
   01/07/2023  Name: Kelly Castillo MRN: [id number] DOB: 08/11/1934  Today's TOC FU Call Status: Today's TOC FU Call Status:: Successful TOC FU Call Competed TOC FU Call Complete Date: 01/07/23  Transition Care Management Follow-up Telephone Call Date of Discharge: 01/06/23 Discharge [facility] Type of Discharge: Inpatient Admission Primary Inpatient Discharge Diagnosis:: Acute Kidney Injury How have you been since you were released from the hospital?: Better Any questions or concerns?: No  Items Reviewed: Did you receive and understand the discharge instructions provided?: Yes (Per daughter, Kennyth Lose, they are following the instructions word for word.) Medications obtained and verified?: Yes (Medications Reviewed) Any new allergies since your discharge?: No Dietary orders reviewed?: No Do you have support at home?: Yes People in Home: child(ren), adult Name of Support/Comfort Primary Source: Daughter during the day and son at night  [place] and Equipment/Supplies: [place] Ordered?: No Any new equipment or medical supplies ordered?: No  Functional Questionnaire: Do you need assistance with bathing/showering or dressing?: Yes Do you need assistance with meal preparation?: Yes Do you need assistance with eating?: No Do you have difficulty maintaining continence: Yes Do you need assistance with getting out of bed/getting out of a chair/moving?: Yes Do you have difficulty managing or taking your medications?: Yes  Folllow up appointments reviewed: PCP Follow-up appointment confirmed?: Yes Date of PCP follow-up appointment?: 01/13/23 Follow-up Provider: Darla Lesches, NP [facility] Follow-up appointment confirmed?: NA Do you need transportation to your follow-up appointment?: No Do you understand care options if your condition(s) worsen?: Yes-patient verbalized understanding  Johnney Killian, RN, BSN, CCM Care Management  Coordinator [facility] Phone: [phone]: [phone]

## 2023-01-08 LAB — MULTIPLE MYELOMA PANEL, SERUM
Albumin SerPl Elph-Mcnc: 4.1 g/dL (ref 2.9–4.4)
Albumin/Glob SerPl: 1.5 (ref 0.7–1.7)
Alpha 1: 0.2 g/dL (ref 0.0–0.4)
Alpha2 Glob SerPl Elph-Mcnc: 0.7 g/dL (ref 0.4–1.0)
B-Globulin SerPl Elph-Mcnc: 0.9 g/dL (ref 0.7–1.3)
Gamma Glob SerPl Elph-Mcnc: 1.1 g/dL (ref 0.4–1.8)
Globulin, Total: 2.9 g/dL (ref 2.2–3.9)
IgA: 192 mg/dL (ref 64–422)
IgG (Immunoglobin G), Serum: 1222 mg/dL (ref 586–1602)
IgM (Immunoglobulin M), Srm: 39 mg/dL (ref 26–217)
Total Protein ELP: 7 g/dL (ref 6.0–8.5)

## 2023-01-13 ENCOUNTER — Encounter: Payer: Self-pay | Admitting: Nurse Practitioner

## 2023-01-13 ENCOUNTER — Ambulatory Visit: Payer: Medicare HMO | Admitting: Nurse Practitioner

## 2023-01-13 ENCOUNTER — Ambulatory Visit: Payer: Medicare HMO | Admitting: Family Medicine

## 2023-01-16 ENCOUNTER — Encounter: Payer: Self-pay | Admitting: Family Medicine

## 2023-01-16 ENCOUNTER — Ambulatory Visit (INDEPENDENT_AMBULATORY_CARE_PROVIDER_SITE_OTHER): Payer: Medicare HMO | Admitting: Family Medicine

## 2023-01-16 VITALS — BP 132/63 | HR 74 | Temp 97.3°F | Ht 63.0 in | Wt 86.4 lb

## 2023-01-16 DIAGNOSIS — E1165 Type 2 diabetes mellitus with hyperglycemia: Secondary | ICD-10-CM

## 2023-01-16 DIAGNOSIS — D631 Anemia in chronic kidney disease: Secondary | ICD-10-CM | POA: Diagnosis not present

## 2023-01-16 DIAGNOSIS — I152 Hypertension secondary to endocrine disorders: Secondary | ICD-10-CM | POA: Diagnosis not present

## 2023-01-16 DIAGNOSIS — E1169 Type 2 diabetes mellitus with other specified complication: Secondary | ICD-10-CM | POA: Diagnosis not present

## 2023-01-16 DIAGNOSIS — E1159 Type 2 diabetes mellitus with other circulatory complications: Secondary | ICD-10-CM

## 2023-01-16 DIAGNOSIS — N1831 Chronic kidney disease, stage 3a: Secondary | ICD-10-CM | POA: Diagnosis not present

## 2023-01-16 DIAGNOSIS — E785 Hyperlipidemia, unspecified: Secondary | ICD-10-CM | POA: Diagnosis not present

## 2023-01-16 DIAGNOSIS — N179 Acute kidney failure, unspecified: Secondary | ICD-10-CM | POA: Diagnosis not present

## 2023-01-16 MED ORDER — ROSUVASTATIN CALCIUM 20 MG PO TABS: 20.0000 mg | ORAL_TABLET | Freq: Every day | ORAL | 3 refills | Status: DC

## 2023-01-16 MED ORDER — SITAGLIPTIN PHOSPHATE 25 MG PO TABS: 25.0000 mg | ORAL_TABLET | Freq: Every day | ORAL | 3 refills | Status: DC

## 2023-01-16 MED ORDER — AMLODIPINE BESYLATE 10 MG PO TABS: 10.0000 mg | ORAL_TABLET | Freq: Every day | ORAL | 3 refills | Status: DC

## 2023-01-16 NOTE — Progress Notes (Signed)
Established Patient Office Visit  Subjective   Patient ID: Kelly Castillo, female    DOB: 02-May-1934  Age: 89 y.o. MRN: [id number]  Chief Complaint  Patient presents with   Transitions Of Care    HPI Here with daughter today.  Today's visit was for Transitional Care Management.  The patient was discharged from [facility] on 01/06/23 with a primary diagnosis of AKI.   Contact with the patient and/or caregiver, by a clinical staff member, was made on 01/07/23 and was documented as a telephone encounter within the EMR.  Through chart review and discussion with the patient I have determined that management of their condition is of moderate complexity.   She was given 1 unit of PRBC on 01/03/23. She was given IV fluids. UTI was treated with rocephin and diflucan for present yeast infection. Lisinopril was started and amlodipine was started. Metformin and glipizide was discontinued. Januvia was started. A1c was 4.9.  She reports doing well since discharged. Denies fever, weakness, dizziness, lightheadedness, urinary symptoms, flank pain, or edema.   She needs a referral to nephrology. Is supposed to follow up outpatient within 2 weeks. She was seen by nephrology inpatient.   Home BPs have been 110s/60s. She has not been checking blood sugars.    Patient Active Problem List   Diagnosis Date Noted   Stage 3a chronic kidney disease ([place]) 01/16/2023   Malnutrition of moderate degree 01/06/2023   AKI (acute kidney injury) ([place]) 01/02/2023   Hyperlipidemia associated with type 2 diabetes mellitus ([place]) 04/19/2021   Low bone density 02/10/2021   Annual physical exam 01/15/2021   CAD (coronary artery disease), native coronary artery 09/25/2015   S/P CABG x 1 08/10/2015   Angina pectoris, crescendo ([place]) 08/08/2015   Abnormal nuclear stress test    Type 2 diabetes mellitus with hyperglycemia, without long-term current use of insulin ([place]) 12/13/2009   MITRAL REGURGITATION 12/13/2009    Hypertension associated with type 2 diabetes mellitus ([place]) 12/13/2009   CARDIOMYOPATHY 12/13/2009   GERD 12/13/2009      ROS    Objective:     BP 132/63   Pulse 74   Temp (!) 97.3 F (36.3 C) (Temporal)   Ht '5\' 3"'$  (1.6 m)   Wt 86 lb 6 oz (39.2 kg)   SpO2 97%   BMI 15.30 kg/m    Physical Exam Vitals and nursing note reviewed.  Constitutional:      General: She is not in acute distress.    Appearance: She is not ill-appearing, toxic-appearing or diaphoretic.  Eyes:     General: No scleral icterus. Cardiovascular:     Rate and Rhythm: Normal rate and regular rhythm.     Heart sounds: Normal heart sounds. No murmur heard. Pulmonary:     Effort: Pulmonary effort is normal. No respiratory distress.     Breath sounds: Normal breath sounds.  Abdominal:     General: Bowel sounds are normal. There is no distension.     Palpations: Abdomen is soft.     Tenderness: There is no abdominal tenderness. There is no guarding or rebound.  Musculoskeletal:     Cervical back: Neck supple. No rigidity.     Right lower leg: No edema.     Left lower leg: No edema.  Skin:    General: Skin is warm and dry.  Neurological:     General: No focal deficit present.     Mental Status: She is alert and oriented to person, place,  and time.  Psychiatric:        Mood and Affect: Mood normal.        Behavior: Behavior normal.        Thought Content: Thought content normal.        Judgment: Judgment normal.      No results found for any visits on 01/16/23.    The ASCVD Risk score (Arnett DK, et al., 2019) failed to calculate for the following reasons:   The 2019 ASCVD risk score is only valid for ages 37 to 33   The patient has a prior MI or stroke diagnosis    Assessment & Plan:   Kelly Castillo was seen today for transitions of care.  Diagnoses and all orders for this visit:  AKI (acute kidney injury) [facility]) Referral to nephrology placed for follow up. Labs pending. GFR of 14 with  creatinine to 3.19 at discharge. Metofrmin and lisinopril discontinued.  -     BMP8+EGFR -     CBC with Differential/Platelet -     Ambulatory referral to Nephrology  Anemia due to stage 3a chronic kidney disease ([place]) CBC pending. Hgb 8.0 at discharge. Had 1 unit of PRBCs. Started on daily iron supplement.   Stage 3a chronic kidney disease (HCC) -     BMP8+EGFR -     CBC with Differential/Platelet -     Ambulatory referral to Nephrology  Type 2 diabetes mellitus with hyperglycemia, without long-term current use of insulin ([place]) Continue januvia. A1c 4.9 in hospital.  -     sitaGLIPtin (JANUVIA) 25 MG tablet; Take 1 tablet (25 mg total) by mouth daily.  Hypertension associated with type 2 diabetes mellitus ([place]) Well controlled on current regimen. Continue amlodipine and metoprolol. Lisinopril discontinued.  -     amLODipine (NORVASC) 10 MG tablet; Take 1 tablet (10 mg total) by mouth daily.  Hyperlipidemia associated with type 2 diabetes mellitus ([place]) Refill provided. Last LDL 119. Recent normal LFTs.  -     rosuvastatin (CRESTOR) 20 MG tablet; Take 1 tablet (20 mg total) by mouth daily.  Return if symptoms worsen or fail to improve.   The patient indicates understanding of these issues and agrees with the plan.  Gwenlyn Perking, FNP

## 2023-01-16 NOTE — Patient Instructions (Signed)
Acute Kidney Injury, Adult Acute kidney injury is a sudden decrease in the ability of the kidneys to do what they are supposed to do. The kidneys are a pair of organs that: Make urine. Make hormones. Keep the right amount of fluids and chemicals in the body. This condition ranges from mild to severe. Over time, it may turn into long-term (chronic) kidney disease. Finding and treating the injury early may keep it from turning into chronic kidney disease. What are the causes? Common causes of this condition include: A problem with blood flow to the kidneys. This may be caused by: Low blood pressure, shock, or severe dehydration. Severe blood loss. Heart and blood vessel disease. Severe burns. Liver disease. Direct damage to the kidneys. This may be caused by: Certain medicines or toxins. Kidney disease. Contrast dye used in imaging tests. An infection of the kidney or bloodstream. Problems from surgery. Trauma to the kidney area. Organ failure. This includes heart or liver failure. A sudden block in urine flow. This may be caused by: Cancer. Kidney stones. An enlarged prostate. What increases the risk? You may be more likely to develop this condition if: You are older than 87 years of age. You are female. You are in the hospital. You may be even more at risk if you are very sick. You have certain conditions. These may include: Chronic kidney or liver disease. Diabetes. Heart disease and heart failure. Lung disease. What are the signs or symptoms? This condition may not cause symptoms until it becomes severe. If it does, symptoms may include: Feeling very tired or having trouble staying awake. Nausea or vomiting. Swelling (edema) of the face, legs, ankles, or feet. Pain in your abdomen, back, or along the side of your back (flank). Urine changes. You may: Make little or no urine. Pass urine with a weak flow. Muscle twitches and cramps. These are most often in the  legs. Confusion or trouble focusing. Not feeling the urge to eat. Fever. How is this diagnosed? This condition may be diagnosed based on your symptoms and your medical history. You may have a physical exam done. You may also have tests, such as: Blood tests. Urine tests. Imaging tests. A kidney biopsy. This is when a sample of kidney tissue is removed and looked at under a microscope. How is this treated? Treatment depends on the cause and how severe the condition is. In mild cases, treatment may not be needed. The kidneys may heal on their own. In severe cases, treatment may include: Treating the cause of the kidney injury. This may mean that you have to change your medicines or the doses you take. Getting fluids through an IV tube. Having a flexible tube (catheter) put in. This tube will drain urine and prevent blockages. Trying to keep problems from starting. This may mean not using certain medicines or not having tests done that could cause more injury. In some cases, you may also need: Dialysis or continuous renal replacement therapy (CRRT). This treatment uses a machine to do the job of the kidneys. Surgery. This may be done to repair a damaged kidney. It could also be done to remove a blockage in the urinary tract. Follow these instructions at home: Medicines Take over-the-counter and prescription medicines only as told by your health care provider. Do not take new medicines unless approved by your health care provider. Many medicines can make kidney damage worse. Do not take vitamin or mineral supplements unless approved by your health care provider. Some of  these can make kidney damage worse. Lifestyle  Make changes to your diet as told by your health care provider. You may need to eat less protein. Get to, and stay at, a healthy weight. If you need help, ask your health care provider. Start or keep up an exercise plan. Exercise at least 30 minutes a day, 5 days a week. Do not  use any products that contain nicotine or tobacco. These products include cigarettes, chewing tobacco, and vaping devices, such as e-cigarettes. If you need help quitting, ask your health care provider. General instructions  Keep track of your blood pressure. Tell your health care provider if you notice any changes. Keep your vaccines up to date. Ask your health care provider which vaccines you need. Keep all follow-up visits. Your health care provider will need to monitor your kidneys. Where to find support American Association of Kidney Patients: rosori.com [place]: https://www.roman.com/ Where to find more information [employer]: kidney.[place]: LifeOptions: lifeoptions.org Kidney School: kidneyschool.org Contact a health care provider if: Your symptoms get worse. You have new symptoms, such as: Headaches. Skin that is darker or lighter than normal. Easy bruising. Feeling itchy. Hiccups. Lack of menstrual periods. You have a fever. Get help right away if: You have signs of severe kidney disease, such as: Chest pain. Shortness of breath. Seizures. You have pain or bleeding when you pass urine. You make little or no urine. These symptoms may be an emergency. Get help right away. Call 911. Do not wait to see if the symptoms will go away. Do not drive yourself to the hospital. This information is not intended to replace advice given to you by your health care provider. Make sure you discuss any questions you have with your health care provider. Document Revised: 05/23/2022 Document Reviewed: 05/23/2022 Elsevier Patient Education  [place].

## 2023-01-17 LAB — CBC WITH DIFFERENTIAL/PLATELET
Basophils Absolute: 0 10*3/uL (ref 0.0–0.2)
Basos: 1 %
EOS (ABSOLUTE): 0.1 10*3/uL (ref 0.0–0.4)
Eos: 2 %
Hematocrit: 31.4 % — ABNORMAL LOW (ref 34.0–46.6)
Hemoglobin: 9.9 g/dL — ABNORMAL LOW (ref 11.1–15.9)
Immature Grans (Abs): 0 10*3/uL (ref 0.0–0.1)
Immature Granulocytes: 0 %
Lymphocytes Absolute: 1.1 10*3/uL (ref 0.7–3.1)
Lymphs: 19 %
MCH: 29.8 pg (ref 26.6–33.0)
MCHC: 31.5 g/dL (ref 31.5–35.7)
MCV: 95 fL (ref 79–97)
Monocytes Absolute: 0.5 10*3/uL (ref 0.1–0.9)
Monocytes: 8 %
Neutrophils Absolute: 4.2 10*3/uL (ref 1.4–7.0)
Neutrophils: 70 %
Platelets: 318 10*3/uL (ref 150–450)
RBC: 3.32 x10E6/uL — ABNORMAL LOW (ref 3.77–5.28)
RDW: 16 % — ABNORMAL HIGH (ref 11.7–15.4)
WBC: 5.9 10*3/uL (ref 3.4–10.8)

## 2023-01-17 LAB — BMP8+EGFR
BUN/Creatinine Ratio: 12 (ref 12–28)
BUN: 28 mg/dL — ABNORMAL HIGH (ref 8–27)
CO2: 22 mmol/L (ref 20–29)
Calcium: 10.1 mg/dL (ref 8.7–10.3)
Chloride: 98 mmol/L (ref 96–106)
Creatinine, Ser: 2.43 mg/dL — ABNORMAL HIGH (ref 0.57–1.00)
Glucose: 280 mg/dL — ABNORMAL HIGH (ref 70–99)
Potassium: 5.2 mmol/L (ref 3.5–5.2)
Sodium: 137 mmol/L (ref 134–144)
eGFR: 19 mL/min/{1.73_m2} — ABNORMAL LOW (ref >59)

## 2023-01-19 ENCOUNTER — Other Ambulatory Visit: Payer: Self-pay | Admitting: Family Medicine

## 2023-01-19 DIAGNOSIS — E1165 Type 2 diabetes mellitus with hyperglycemia: Secondary | ICD-10-CM

## 2023-01-19 MED ORDER — LANTUS SOLOSTAR 100 UNIT/ML ~~LOC~~ SOPN: 10.0000 [IU] | PEN_INJECTOR | Freq: Every day | SUBCUTANEOUS | 0 refills | Status: DC

## 2023-01-20 ENCOUNTER — Other Ambulatory Visit: Payer: Self-pay | Admitting: *Deleted

## 2023-01-21 MED ORDER — RENA-VITE PO TABS: 1.0000 | ORAL_TABLET | Freq: Every day | ORAL | 1 refills | Status: DC

## 2023-01-22 ENCOUNTER — Telehealth: Payer: Self-pay | Admitting: Nurse Practitioner

## 2023-01-22 ENCOUNTER — Other Ambulatory Visit: Payer: Self-pay | Admitting: Nurse Practitioner

## 2023-01-22 MED ORDER — PEN NEEDLES 33G X 4 MM MISC: 1.0000 | Freq: Every day | 3 refills | Status: DC

## 2023-01-22 NOTE — Telephone Encounter (Signed)
Pen needles sent to pharmacy Who is giving her her shots if daughter says she does not know how to do it. Needs  to be seen if you think she has a UTI

## 2023-01-22 NOTE — Progress Notes (Signed)
Pen needles sent to pharmacy She will need to make an appointment if we need to check her urine Who is giving her her shots if daughter is not sure how  to do?  Meds ordered this encounter  Medications   Insulin Pen Needle (PEN NEEDLES) 33G X 4 MM MISC    Sig: 1 each by Does not apply route daily.    Dispense:  100 each    Refill:  3    Order Specific Question:   Supervising Provider    Answer:   Worthy Rancher [id number]   [place], FNP

## 2023-01-23 ENCOUNTER — Other Ambulatory Visit: Payer: Self-pay

## 2023-01-23 MED ORDER — PEN NEEDLES 33G X 4 MM MISC: 1.0000 | Freq: Every day | 3 refills | Status: DC

## 2023-01-23 NOTE — Telephone Encounter (Signed)
Rx resent to [facility] in [place]

## 2023-01-23 NOTE — Telephone Encounter (Signed)
Needs needles Rx [facility] to [facility] in [place]. They were sent to wrong pharmacy.

## 2023-01-23 NOTE — Telephone Encounter (Signed)
Called and spoke with daughter and she is upset that blood sugars were high for so long according to previous labs without being treated aggressively. Has appt in April for chronic follow up but blood sugars fasting are well over 200 each morning for several days and daughter would like her to be seen earlier. Scheduled with DOD next week per daughters request

## 2023-01-27 ENCOUNTER — Ambulatory Visit (INDEPENDENT_AMBULATORY_CARE_PROVIDER_SITE_OTHER): Payer: Medicare HMO | Admitting: Family Medicine

## 2023-01-27 ENCOUNTER — Encounter: Payer: Self-pay | Admitting: Family Medicine

## 2023-01-27 VITALS — BP 112/51 | HR 50 | Temp 97.9°F | Ht 63.0 in | Wt 86.0 lb

## 2023-01-27 DIAGNOSIS — D631 Anemia in chronic kidney disease: Secondary | ICD-10-CM

## 2023-01-27 DIAGNOSIS — E1165 Type 2 diabetes mellitus with hyperglycemia: Secondary | ICD-10-CM | POA: Diagnosis not present

## 2023-01-27 DIAGNOSIS — E785 Hyperlipidemia, unspecified: Secondary | ICD-10-CM | POA: Diagnosis not present

## 2023-01-27 DIAGNOSIS — N184 Chronic kidney disease, stage 4 (severe): Secondary | ICD-10-CM | POA: Diagnosis not present

## 2023-01-27 DIAGNOSIS — E1159 Type 2 diabetes mellitus with other circulatory complications: Secondary | ICD-10-CM

## 2023-01-27 DIAGNOSIS — E1169 Type 2 diabetes mellitus with other specified complication: Secondary | ICD-10-CM

## 2023-01-27 DIAGNOSIS — I152 Hypertension secondary to endocrine disorders: Secondary | ICD-10-CM | POA: Diagnosis not present

## 2023-01-27 DIAGNOSIS — E1122 Type 2 diabetes mellitus with diabetic chronic kidney disease: Secondary | ICD-10-CM

## 2023-01-27 MED ORDER — LANCET DEVICE MISC: 0 refills | Status: AC

## 2023-01-27 MED ORDER — BLOOD GLUCOSE TEST VI STRP: ORAL_STRIP | 0 refills | Status: DC

## 2023-01-27 MED ORDER — LANTUS SOLOSTAR 100 UNIT/ML ~~LOC~~ SOPN: 10.0000 [IU] | PEN_INJECTOR | Freq: Every day | SUBCUTANEOUS | 99 refills | Status: DC

## 2023-01-27 MED ORDER — LANCETS MISC. MISC: 99 refills | Status: AC

## 2023-01-27 MED ORDER — BLOOD GLUCOSE MONITORING SUPPL DEVI: 0 refills | Status: AC

## 2023-01-27 MED ORDER — FREESTYLE LIBRE READER DEVI: 1.0000 [IU] | Freq: Every day | 1 refills | Status: DC

## 2023-01-27 MED ORDER — FREESTYLE LIBRE 3 SENSOR MISC: 3 refills | Status: DC

## 2023-01-27 NOTE — Progress Notes (Signed)
Subjective: [id number] PCP: Ivy Lynn, NP (Inactive) [id number] B Vaugh is a 89 y.o. female presenting to clinic today for:  1. Hyperglycemia in the setting of CKD 4, type 2 diabetes, hypertension and hyperlipidemia. Patient is brought to the office by her daughter today.  She notes that she was recently seen for type 2 diabetes with hyperglycemia.  She was placed on Lantus but she was under the impression the patient was post advance her dose to 16 units.  This was only sent in as 10 units that she was not quite sure.  She notes that the blood sugars continue to run around 220s each morning.  Not checking it in the evening time.  Does need a new meter and strips.  Continues to take Januvia 25 mg which is renally dosed.  She is compliant with all other medications.   ROS: Per HPI  Allergies  Allergen Reactions   Penicillins Hives   Past Medical History:  Diagnosis Date   Anemia    GERD (gastroesophageal reflux disease)    HTN (hypertension)    Hypercholesterolemia    LBBB (left bundle branch block)    Lumbar disc disease    Non-ischemic cardiomyopathy (HCC)    Poor short term memory    Type II diabetes mellitus ([place])     Current Outpatient Medications:    acetaminophen (TYLENOL) 325 MG tablet, Take 650 mg by mouth as needed., Disp: , Rfl:    amLODipine (NORVASC) 10 MG tablet, Take 1 tablet (10 mg total) by mouth daily., Disp: 90 tablet, Rfl: 3   aspirin 81 MG tablet, Take 1 tablet (81 mg total) by mouth daily., Disp: , Rfl:    blood glucose meter kit and supplies, May test up to 4 times a day. E11.9, Disp: 1 each, Rfl: 0   Cholecalciferol (VITAMIN D3) 125 MCG (5000 UT) TABS, Take 1 tablet by mouth daily., Disp: , Rfl:    ferrous sulfate (FEROSUL) 325 (65 FE) MG tablet, TAKE ONE TABLET ONCE DAILY, Disp: 30 tablet, Rfl: 1   insulin glargine (LANTUS SOLOSTAR) 100 UNIT/ML Solostar Pen, Inject 10 Units into the skin at bedtime., Disp: 15 mL, Rfl: 0   Insulin Pen  Needle (PEN NEEDLES) 33G X 4 MM MISC, 1 each by Does not apply route daily., Disp: 100 each, Rfl: 3   metoprolol tartrate (LOPRESSOR) 25 MG tablet, TAKE (1) TABLET TWICE A DAY. (Patient taking differently: Take 25 mg by mouth daily. TAKE (1) TABLET TWICE A DAY.), Disp: 180 tablet, Rfl: 3   multivitamin (RENA-VIT) TABS tablet, Take 1 tablet by mouth at bedtime., Disp: 90 tablet, Rfl: 1   Nutritional Supplements (FEEDING SUPPLEMENT, NEPRO CARB STEADY,) LIQD, Take 237 mLs by mouth 3 (three) times daily with meals., Disp: 1000 mL, Rfl: 0   ONETOUCH VERIO test strip, 1 each by Other route daily., Disp: 100 each, Rfl: 1   rosuvastatin (CRESTOR) 20 MG tablet, Take 1 tablet (20 mg total) by mouth daily., Disp: 90 tablet, Rfl: 3   sitaGLIPtin (JANUVIA) 25 MG tablet, Take 1 tablet (25 mg total) by mouth daily., Disp: 90 tablet, Rfl: 3 Social History   Socioeconomic History   Marital status: Widowed    Spouse name: Not on file   Number of children: 10   Years of education: Not on file   Highest education level: Not on file  Occupational History   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  Tobacco Use   Smoking status:  Never   Smokeless tobacco: Never  Substance and Sexual Activity   Alcohol use: No    Alcohol/week: 0.0 standard drinks of alcohol   Drug use: No   Sexual activity: Yes    Birth control/protection: Surgical  Other Topics Concern   Not on file  Social History Narrative   9 children living and 1 deceased.   Lives with youngest Son. Daughter, Kennyth Lose, comes during the day to help patient with ADLs and stays until her brother gets home.    Social Determinants of Health   Financial Resource Strain: Low Risk  (09/23/2022)   Overall Financial Resource Strain (CARDIA)    Difficulty of Paying Living Expenses: Not hard at all  Food Insecurity: No Food Insecurity (01/02/2023)   Hunger Vital Sign    Worried About Running Out of Food in the Last Year: Never  true    Ran Out of Food in the Last Year: Never true  Transportation Needs: No Transportation Needs (01/02/2023)   PRAPARE - Hydrologist (Medical): No    Lack of Transportation (Non-Medical): No  Physical Activity: Insufficiently Active (09/23/2022)   Exercise Vital Sign    Days of Exercise per Week: 2 days    Minutes of Exercise per Session: 20 min  Stress: No Stress Concern Present (09/23/2022)   [place]    Feeling of Stress : Not at all  Social Connections: Moderately Isolated (09/23/2022)   Social Connection and Isolation Panel [NHANES]    Frequency of Communication with Friends and Family: More than three times a week    Frequency of Social Gatherings with Friends and Family: More than three times a week    Attends Religious Services: 1 to 4 times per year    Active Member of [employer] or Organizations: No    Attends Archivist Meetings: Never    Marital Status: Widowed  Intimate Partner Violence: Not At Risk (01/02/2023)   Humiliation, Afraid, Rape, and Kick questionnaire    Fear of Current or Ex-Partner: No    Emotionally Abused: No    Physically Abused: No    Sexually Abused: No   Family History  Problem Relation Age of Onset   Anesthesia problems Neg Hx    Hypotension Neg Hx    Malignant hyperthermia Neg Hx    Pseudochol deficiency Neg Hx     Objective: Office vital signs reviewed. BP (!) 112/51   Pulse (!) 50   Temp 97.9 F (36.6 C)   Ht '5\' 3"'$  (1.6 m)   Wt 86 lb (39 kg)   SpO2 99%   BMI 15.23 kg/m   Physical Examination:  General: Awake, alert, well-appearing elderly female, No acute distress HEENT: sclera white, MMM Cardio: regular rate and rhythm, S1S2 heard, no murmurs appreciated Pulm: clear to auscultation bilaterally, no wheezes, rhonchi or rales; normal work of breathing on room air Extremities: No edema  Assessment/ Plan: 89 y.o. female   Type  2 diabetes mellitus with hyperglycemia, without long-term current use of insulin (HCC) - Plan: Continuous Blood Gluc Sensor (FREESTYLE LIBRE 3 SENSOR) MISC, Continuous Blood Gluc Receiver (FREESTYLE LIBRE READER) DEVI, Blood Glucose Monitoring Suppl DEVI, Glucose Blood (BLOOD GLUCOSE TEST STRIPS) STRP, Lancet Device MISC, Lancets Misc. MISC, insulin glargine (LANTUS SOLOSTAR) 100 UNIT/ML Solostar Pen  Stage 4 chronic kidney disease ([place]) - Plan: Continuous Blood Gluc Sensor (FREESTYLE LIBRE 3 SENSOR) MISC, Continuous Blood Gluc Receiver (FREESTYLE LIBRE READER) DEVI,  Blood Glucose Monitoring Suppl DEVI, Glucose Blood (BLOOD GLUCOSE TEST STRIPS) STRP, Lancet Device MISC, Lancets Misc. MISC, insulin glargine (LANTUS SOLOSTAR) 100 UNIT/ML Solostar Pen  Anemia due to stage 4 chronic kidney disease ([place]) - Plan: Continuous Blood Gluc Sensor (FREESTYLE LIBRE 3 SENSOR) MISC, Continuous Blood Gluc Receiver (FREESTYLE LIBRE READER) DEVI, Blood Glucose Monitoring Suppl DEVI, Glucose Blood (BLOOD GLUCOSE TEST STRIPS) STRP, Lancet Device MISC, Lancets Misc. MISC, insulin glargine (LANTUS SOLOSTAR) 100 UNIT/ML Solostar Pen  Hypertension associated with type 2 diabetes mellitus ([place]) - Plan: Continuous Blood Gluc Sensor (FREESTYLE LIBRE 3 SENSOR) MISC, Continuous Blood Gluc Receiver (FREESTYLE LIBRE READER) DEVI, Blood Glucose Monitoring Suppl DEVI, Glucose Blood (BLOOD GLUCOSE TEST STRIPS) STRP, Lancet Device MISC, Lancets Misc. MISC, insulin glargine (LANTUS SOLOSTAR) 100 UNIT/ML Solostar Pen  Hyperlipidemia associated with type 2 diabetes mellitus ([place]) - Plan: Continuous Blood Gluc Sensor (FREESTYLE LIBRE 3 SENSOR) MISC, Continuous Blood Gluc Receiver (FREESTYLE LIBRE READER) DEVI, Blood Glucose Monitoring Suppl DEVI, Glucose Blood (BLOOD GLUCOSE TEST STRIPS) STRP, Lancet Device MISC, Lancets Misc. MISC, insulin glargine (LANTUS SOLOSTAR) 100 UNIT/ML Solostar Pen  I have recommended that they advance the insulin by  1 unit every 2 days for fasting blood sugar above 150.  This was explained verbally with the daughter as well as given a sample AVS on how to do this.  I have also gone ahead and sent over freestyle libre 3 to [facility] for this patient.  I think she is appropriate for it given history of hypoglycemia, CKD and now new onset use of insulin.  We discussed that pressure on the meter can cause false lows and I have encouraged them to check any erroneous or unusual blood sugars with a point-of-care meter which was also sent to the pharmacy today.  I have adjusted her insulin to range anywhere between 10 and 20 units depending on what amount she ends up needing to get her blood sugar under control.  16 units right off the bat may be a little too aggressive for this octogenarian.  Additionally, in the setting of her CKD 4 I would consider switching her Crestor to Lipitor which is a bit more renal friendly.  She has an upcoming appointment with her new PCP so I will leave this to discuss amongst themselves.  She also has an appointment to be scheduled with nephrology.  I will CC this nephrologist as FYI today  No orders of the defined types were placed in this encounter.  No orders of the defined types were placed in this encounter.    Janora Norlander, DO [place] [phone]

## 2023-01-27 NOTE — Patient Instructions (Signed)
If your FASTING blood sugar is above 150 for 2 consecutive days, increase by 1 unit of your long acting insulin.  Example: Day 1: Blood sugar was 159 Day 2: Blood sugar was 205  Increase Lantus to 11 units Day 3: Blood sugar is 202 Day 4: Blood sugars 168  Increase Lantus to 12 units Day 5: Blood sugar is 105 Day 6: Blood sugars 145  Stick with 12 units.  Do not increase.

## 2023-02-06 ENCOUNTER — Ambulatory Visit: Payer: Medicare HMO

## 2023-02-06 ENCOUNTER — Telehealth: Payer: Self-pay | Admitting: Family Medicine

## 2023-02-06 DIAGNOSIS — E1165 Type 2 diabetes mellitus with hyperglycemia: Secondary | ICD-10-CM

## 2023-02-06 NOTE — Telephone Encounter (Signed)
Patient having trouble getting libre sensor to work. We had an opening in triage schedule at 3pm. Patient scheduled. will be here for 3pm appt.

## 2023-02-06 NOTE — Telephone Encounter (Signed)
Please call patients daughter, Kennyth Lose, regarding patients libre. Having issues with it.   [phone]

## 2023-02-06 NOTE — Progress Notes (Signed)
Patient here today because a new Libre sensor was applied and would not work.  I removed the sensor and reapplied one that we have here in our office.  Sensor was scanned and appeared to be working but patient was advised to let us know if it did not work.

## 2023-02-10 ENCOUNTER — Telehealth: Payer: Self-pay | Admitting: Family Medicine

## 2023-02-11 NOTE — Telephone Encounter (Signed)
Patient aware and verbalized understanding. °

## 2023-02-18 ENCOUNTER — Ambulatory Visit: Payer: Medicare HMO | Admitting: Family Medicine

## 2023-02-19 ENCOUNTER — Telehealth: Payer: Self-pay | Admitting: Family Medicine

## 2023-02-19 ENCOUNTER — Ambulatory Visit: Payer: Medicare HMO | Admitting: Family Medicine

## 2023-02-19 ENCOUNTER — Encounter (HOSPITAL_COMMUNITY): Payer: Self-pay

## 2023-02-19 ENCOUNTER — Other Ambulatory Visit: Payer: Self-pay

## 2023-02-19 ENCOUNTER — Emergency Department (HOSPITAL_COMMUNITY)
Admission: EM | Admit: 2023-02-19 | Discharge: 2023-02-19 | Disposition: A | Discharge status: Home / Self Care | Payer: Medicare HMO | Attending: Emergency Medicine | Admitting: Emergency Medicine

## 2023-02-19 DIAGNOSIS — R739 Hyperglycemia, unspecified: Secondary | ICD-10-CM | POA: Diagnosis present

## 2023-02-19 DIAGNOSIS — Z794 Long term (current) use of insulin: Secondary | ICD-10-CM | POA: Insufficient documentation

## 2023-02-19 DIAGNOSIS — E1169 Type 2 diabetes mellitus with other specified complication: Secondary | ICD-10-CM

## 2023-02-19 DIAGNOSIS — N179 Acute kidney failure, unspecified: Secondary | ICD-10-CM | POA: Diagnosis not present

## 2023-02-19 DIAGNOSIS — N189 Chronic kidney disease, unspecified: Secondary | ICD-10-CM | POA: Insufficient documentation

## 2023-02-19 DIAGNOSIS — I129 Hypertensive chronic kidney disease with stage 1 through stage 4 chronic kidney disease, or unspecified chronic kidney disease: Secondary | ICD-10-CM | POA: Diagnosis not present

## 2023-02-19 DIAGNOSIS — I152 Hypertension secondary to endocrine disorders: Secondary | ICD-10-CM

## 2023-02-19 DIAGNOSIS — N1832 Chronic kidney disease, stage 3b: Secondary | ICD-10-CM | POA: Diagnosis not present

## 2023-02-19 DIAGNOSIS — E1165 Type 2 diabetes mellitus with hyperglycemia: Secondary | ICD-10-CM | POA: Diagnosis not present

## 2023-02-19 DIAGNOSIS — Z79899 Other long term (current) drug therapy: Secondary | ICD-10-CM | POA: Diagnosis not present

## 2023-02-19 DIAGNOSIS — E1110 Type 2 diabetes mellitus with ketoacidosis without coma: Secondary | ICD-10-CM | POA: Diagnosis not present

## 2023-02-19 DIAGNOSIS — Z7984 Long term (current) use of oral hypoglycemic drugs: Secondary | ICD-10-CM | POA: Insufficient documentation

## 2023-02-19 DIAGNOSIS — Z7982 Long term (current) use of aspirin: Secondary | ICD-10-CM | POA: Diagnosis not present

## 2023-02-19 DIAGNOSIS — D631 Anemia in chronic kidney disease: Secondary | ICD-10-CM

## 2023-02-19 DIAGNOSIS — N184 Chronic kidney disease, stage 4 (severe): Secondary | ICD-10-CM

## 2023-02-19 LAB — CBC
HCT: 28.9 % — ABNORMAL LOW (ref 36.0–46.0)
Hemoglobin: 9.4 g/dL — ABNORMAL LOW (ref 12.0–15.0)
MCH: 29.4 pg (ref 26.0–34.0)
MCHC: 32.5 g/dL (ref 30.0–36.0)
MCV: 90.3 fL (ref 80.0–100.0)
Platelets: 206 10*3/uL (ref 150–400)
RBC: 3.2 MIL/uL — ABNORMAL LOW (ref 3.87–5.11)
RDW: 13.8 % (ref 11.5–15.5)
WBC: 6.8 10*3/uL (ref 4.0–10.5)
nRBC: 0 % (ref 0.0–0.2)

## 2023-02-19 LAB — BASIC METABOLIC PANEL
Anion gap: 10 (ref 5–15)
BUN: 38 mg/dL — ABNORMAL HIGH (ref 8–23)
CO2: 23 mmol/L (ref 22–32)
Calcium: 9.9 mg/dL (ref 8.9–10.3)
Chloride: 94 mmol/L — ABNORMAL LOW (ref 98–111)
Creatinine, Ser: 1.86 mg/dL — ABNORMAL HIGH (ref 0.44–1.00)
GFR, Estimated: 26 mL/min — ABNORMAL LOW (ref >60)
Glucose, Bld: 501 mg/dL (ref 70–99)
Potassium: 4.7 mmol/L (ref 3.5–5.1)
Sodium: 127 mmol/L — ABNORMAL LOW (ref 135–145)

## 2023-02-19 LAB — URINALYSIS, ROUTINE W REFLEX MICROSCOPIC
Bilirubin Urine: NEGATIVE
Glucose, UA: >=500 mg/dL — AB
Hgb urine dipstick: NEGATIVE
Ketones, ur: NEGATIVE mg/dL
Leukocytes,Ua: NEGATIVE
Nitrite: NEGATIVE
Protein, ur: NEGATIVE mg/dL
Specific Gravity, Urine: 1.015 (ref 1.005–1.030)
pH: 6 (ref 5.0–8.0)

## 2023-02-19 LAB — CBG MONITORING, ED
Glucose-Capillary: 518 mg/dL (ref 70–99)
Glucose-Capillary: 353 mg/dL — ABNORMAL HIGH (ref 70–99)

## 2023-02-19 MED ORDER — INSULIN ASPART 100 UNIT/ML IJ SOLN
4.0000 [IU] | INTRAMUSCULAR | Status: AC
  Administered 2023-02-19: 4 [IU] via SUBCUTANEOUS
  Filled 2023-02-19: qty 1

## 2023-02-19 MED ORDER — LANTUS SOLOSTAR 100 UNIT/ML ~~LOC~~ SOPN: 25.0000 [IU] | PEN_INJECTOR | Freq: Every day | SUBCUTANEOUS | 99 refills | Status: DC

## 2023-02-19 MED ORDER — INSULIN ASPART 100 UNIT/ML IJ SOLN: 6.0000 [IU] | Freq: Three times a day (TID) | INTRAMUSCULAR | 11 refills | Status: DC

## 2023-02-19 MED ORDER — SODIUM CHLORIDE 0.9 % IV BOLUS
1000.0000 mL | Freq: Once | INTRAVENOUS | Status: AC
  Administered 2023-02-19: 1000 mL via INTRAVENOUS

## 2023-02-19 NOTE — Telephone Encounter (Signed)
Can't get her BS under 300. Running between 315-350

## 2023-02-19 NOTE — ED Triage Notes (Signed)
Pt seen at PCP today due to difficulty managing blood sugar at home. CBG at office >600. Pt endorses increased thirst and urination. Pt denies any N/V/D/sweating.   CBG in triage 518.   Recent medication change to [place].  Pt A & O x 4, NAD. Ambulatory in triage.

## 2023-02-19 NOTE — Discharge Instructions (Addendum)
You were seen for your elevated blood sugar in the emergency department.   At home, please take 25 units of Lantus at night and follow the sliding scale insulin for rapid acting mealtime insulin 3 times a day.    Novolog sliding scale. 121-150: 0 units, 151-200: 1 unit, 201-250: 2 units, 251-300: 3 units, 301-350: 4 units, 351-400: 5 units, >400: 6 units  Check your MyChart online for the results of any tests that had not resulted by the time you left the emergency department.   Follow-up with your primary doctor in 2-3 days regarding your visit.    Return immediately to the emergency department if you experience any of the following: Confusion, sweating, weakness or numbness of your arms or legs, slurred speech, or any other concerning symptoms.    Thank you for visiting our Emergency Department. It was a pleasure taking care of you today.

## 2023-02-19 NOTE — ED Provider Notes (Signed)
[place] EMERGENCY DEPARTMENT AT [facility] Provider Note   CSN: [id number] Arrival date & time: 02/19/23  1808     History  Chief Complaint  Patient presents with   Hyperglycemia    Kelly Castillo is a 89 y.o. female.  89 year old female with history of CKD, DM 2, hypertension, and hyperlipidemia who presents to the emergency department with elevated blood sugars.  Patient was recently hospitalized and discharged for anemia and CKD.  Upon discharge her metformin and another medication for her diabetes were discontinued.  Was sent home with Lantus and 25 mg of Januvia.  Has had persistently elevated blood sugars in the 300-350 range since then.  Has increased her Lantus to 20 units at night most recently.  Went to her nephrology appointment today and her blood sugar was over 600 so they referred her to the emergency department for evaluation.  Her daughter reports that she is otherwise doing well.  They have been checking her urine for ketones at home which have been negative.       Home Medications Prior to Admission medications   Medication Sig Start Date End Date Taking? Authorizing Provider  insulin aspart (NOVOLOG) 100 UNIT/ML injection Inject 6 Units into the skin 3 (three) times daily with meals. Novolog sliding scale. 121-150: 0 units, 151-200: 1 unit, 201-250: 2 units, 251-300: 3 units, 301-350: 4 units, 351-400: 5 units, >400: 6 units 02/19/23  Yes Rondel Baton, MD  acetaminophen (TYLENOL) 325 MG tablet Take 650 mg by mouth as needed.    [provider]  amLODipine (NORVASC) 10 MG tablet Take 1 tablet (10 mg total) by mouth daily. 01/16/23   Gabriel Earing, FNP  aspirin 81 MG tablet Take 1 tablet (81 mg total) by mouth daily. 08/16/18   Lewayne Bunting, MD  blood glucose meter kit and supplies May test up to 4 times a day. E11.9 10/07/22   Daryll Drown, NP  Blood Glucose Monitoring Suppl DEVI Check BGs up to 4 times daily. May substitute to  any manufacturer covered by patient's insurance. E11.65 01/27/23   Raliegh Ip, DO  Cholecalciferol (VITAMIN D3) 125 MCG (5000 UT) TABS Take 1 tablet by mouth daily.    [provider]  Continuous Blood Gluc Receiver (FREESTYLE LIBRE READER) DEVI 1 Units by Does not apply route daily. UAD to test BGs daily. Dx E11.65 01/27/23   Raliegh Ip, DO  Continuous Blood Gluc Sensor (FREESTYLE LIBRE 3 SENSOR) MISC Place 1 sensor on the skin every 14 days. Use to check glucose continuously. E11.65 01/27/23   Raliegh Ip, DO  ferrous sulfate (FEROSUL) 325 (65 FE) MG tablet TAKE ONE TABLET ONCE DAILY 11/25/22   Daryll Drown, NP  Glucose Blood (BLOOD GLUCOSE TEST STRIPS) STRP Check BGs 4 times daily. May substitute to any manufacturer covered by patient's insurance. E11.65 01/27/23   Raliegh Ip, DO  insulin glargine (LANTUS SOLOSTAR) 100 UNIT/ML Solostar Pen Inject 25 Units into the skin at bedtime. 02/19/23   Rondel Baton, MD  Insulin Pen Needle (PEN NEEDLES) 33G X 4 MM MISC 1 each by Does not apply route daily. 01/23/23   Bennie Pierini, FNP  Lancet Device MISC Check BGs 4 times daily. May substitute to any manufacturer covered by patient's insurance. E11.65 01/27/23   Raliegh Ip, DO  Lancets Misc. MISC Check BGs 4 times daily. May substitute to any manufacturer covered by patient's insurance. E11.65 01/27/23   Nadine Counts,  Ashly M, DO  metoprolol tartrate (LOPRESSOR) 25 MG tablet TAKE (1) TABLET TWICE A DAY. Patient taking differently: Take 25 mg by mouth daily. TAKE (1) TABLET TWICE A DAY. 10/07/22   Daryll Drown, NP  multivitamin (RENA-VIT) TABS tablet Take 1 tablet by mouth at bedtime. 01/21/23   Gabriel Earing, FNP  Nutritional Supplements (FEEDING SUPPLEMENT, NEPRO CARB STEADY,) LIQD Take 237 mLs by mouth 3 (three) times daily with meals. 01/06/23   Sherryll Burger, Pratik D, DO  rosuvastatin (CRESTOR) 20 MG tablet Take 1 tablet (20 mg total) by mouth daily.  01/16/23   Gabriel Earing, FNP  sitaGLIPtin (JANUVIA) 25 MG tablet Take 1 tablet (25 mg total) by mouth daily. 01/16/23   Gabriel Earing, FNP      Allergies    Penicillins    Review of Systems   Review of Systems  Physical Exam Updated Vital Signs BP (!) 151/65   Pulse 65   Temp (!) 97.4 F (36.3 C) (Oral)   Resp 20   Ht 4\' 10"  (1.473 m)   Wt 39 kg   SpO2 100%   BMI 17.97 kg/m  Physical Exam Vitals and nursing note reviewed.  Constitutional:      General: She is not in acute distress.    Appearance: She is well-developed.  HENT:     Head: Normocephalic and atraumatic.  Eyes:     Conjunctiva/sclera: Conjunctivae normal.  Cardiovascular:     Rate and Rhythm: Normal rate and regular rhythm.     Heart sounds: No murmur heard. Pulmonary:     Effort: Pulmonary effort is normal. No respiratory distress.  Abdominal:     General: There is no distension.     Palpations: Abdomen is soft. There is no mass.     Tenderness: There is no abdominal tenderness. There is no guarding.  Musculoskeletal:        General: No swelling.     Cervical back: Neck supple.     Right lower leg: No edema.     Left lower leg: No edema.  Skin:    General: Skin is warm and dry.     Capillary Refill: Capillary refill takes less than 2 seconds.  Neurological:     Mental Status: She is alert.  Psychiatric:        Mood and Affect: Mood normal.     ED Results / Procedures / Treatments   Labs (all labs ordered are listed, but only abnormal results are displayed) Labs Reviewed  BASIC METABOLIC PANEL - Abnormal; Notable for the following components:      Result Value   Sodium 127 (*)    Chloride 94 (*)    Glucose, Bld 501 (*)    BUN 38 (*)    Creatinine, Ser 1.86 (*)    GFR, Estimated 26 (*)    All other components within normal limits  CBC - Abnormal; Notable for the following components:   RBC 3.20 (*)    Hemoglobin 9.4 (*)    HCT 28.9 (*)    All other components within normal limits   URINALYSIS, ROUTINE W REFLEX MICROSCOPIC - Abnormal; Notable for the following components:   Color, Urine STRAW (*)    Glucose, UA >=500 (*)    Bacteria, UA RARE (*)    All other components within normal limits  CBG MONITORING, ED - Abnormal; Notable for the following components:   Glucose-Capillary 518 (*)    All other components within normal limits  CBG MONITORING, ED - Abnormal; Notable for the following components:   Glucose-Capillary 353 (*)    All other components within normal limits    EKG None  Radiology No results found.  Procedures Procedures   Medications Ordered in ED Medications  sodium chloride 0.9 % bolus 1,000 mL (0 mLs Intravenous Stopped 02/19/23 2058)  insulin aspart (novoLOG) injection 4 Units (4 Units Subcutaneous Given 02/19/23 2104)    ED Course/ Medical Decision Making/ A&P                             Medical Decision Making Amount and/or Complexity of Data Reviewed Labs: ordered.  Risk Prescription drug management.   ALEISHA LONGWELL is a 89 y.o. female with comorbidities that complicate the patient evaluation including CKD, DM 2, hypertension, and hyperlipidemia who presents to the emergency department with elevated blood sugars.   Initial Ddx:  DKA, HHS, hyperglycemia  MDM:  Feel the patient likely has asymptomatic hyperglycemia in the setting of her medications being changed along with her improving renal function which may be decreasing the duration of action of her insulin.  Will obtain blood work at this time to ensure she is not in DKA.  Does not appear altered to feel that HHS is less likely.  Plan:  Labs Urinalysis  ED Summary/Re-evaluation:  Patient given IV fluids from triage.  Also given 4 units of insulin after her chemistry showed a glucose of 500.  Her blood sugar down trended to 350 while in the emergency department.  Did discuss with pharmacy who recommended increasing her Lantus to 25 units nightly, continuing the  Januvia, and starting her on sliding scale insulin which was discussed with her and her daughter.  Was given a prescription for NovoLog.  Daughter reports that she has other supplies for her diabetes at home.  Will have her follow-up with her primary doctor in several days regarding her blood sugar management.  This patient presents to the ED for concern of complaints listed in HPI, this involves an extensive number of treatment options, and is a complaint that carries with it a high risk of complications and morbidity. Disposition including potential need for admission considered.   Dispo: DC Home. Return precautions discussed including, but not limited to, those listed in the AVS. Allowed pt time to ask questions which were answered fully prior to dc.  Additional history obtained from daughter Records reviewed Outpatient Clinic Notes The following labs were independently interpreted: Chemistry and show CKD I personally reviewed and interpreted cardiac monitoring: normal sinus rhythm  I have reviewed the patients home medications and made adjustments as needed Consults:  Pharmacy Social Determinants of health:  Elderly  Final Clinical Impression(s) / ED Diagnoses Final diagnoses:  Hyperglycemia    Rx / DC Orders ED Discharge Orders          Ordered    insulin aspart (NOVOLOG) 100 UNIT/ML injection  3 times daily with meals        02/19/23 2320    insulin glargine (LANTUS SOLOSTAR) 100 UNIT/ML Solostar Pen  Daily at bedtime        02/19/23 2320              Rondel Baton, MD 02/20/23 [phone]

## 2023-02-19 NOTE — ED Provider Triage Note (Signed)
Emergency Medicine Provider Triage Evaluation Note  Kelly Castillo , a 89 y.o. female  was evaluated in triage.  Pt complains of a high glucose Review of Systems  Positive: Weakness, elevated glucose Negative: fever  Physical Exam  BP (!) 139/59 (BP Location: Left Arm)   Pulse 61   Temp (!) 97.4 F (36.3 C) (Oral)   Resp 20   Ht 4\' 10"  (1.473 m)   Wt 39 kg   SpO2 100%   BMI 17.97 kg/m  Gen:   Awake, no distress   Resp:  Normal effort  MSK:   Moves extremities without difficulty  Other:    Medical Decision Making  Medically screening exam initiated at 7:41 PM.  Appropriate orders placed.  Kelly Castillo was informed that the remainder of the evaluation will be completed by another provider, this initial triage assessment does not replace that evaluation, and the importance of remaining in the ED until their evaluation is complete.     Fransico Meadow, [place] 02/19/23 2020

## 2023-02-20 ENCOUNTER — Ambulatory Visit: Payer: Medicare HMO | Admitting: Family Medicine

## 2023-02-20 NOTE — Telephone Encounter (Signed)
Daughter came in today to ask about pt's insulin. Asked her PCP's question below, they have been doing the 20 u but her BS were still staying up. They went to the ED last PM, she was given Novolin at the ED and instructed to increase Lantus to 25 u and to give this when she got home. He fasting BS this morning was 390. Per PCP to stay at the 25 u till her appt on Tue 02/24/23 and will re-evaluate then. Also instructed daughter on how to use a vial of Lantus since she has always used [employer]   Arrie Senate, FNP  to [facility] Clinical Pool 02/20/23  1:25 PM Has patient increased her dose to 20 units as previously discussed? Her last A1C was well within goal, which is reassuring. Thanks! Jerrel Ivory

## 2023-02-20 NOTE — Telephone Encounter (Signed)
Talked to daughter went over everything - she states she is very un happy how her moms health has been taken care of with her previous pcp

## 2023-02-24 ENCOUNTER — Encounter: Payer: Self-pay | Admitting: Family Medicine

## 2023-02-24 ENCOUNTER — Ambulatory Visit (INDEPENDENT_AMBULATORY_CARE_PROVIDER_SITE_OTHER): Payer: Medicare HMO | Admitting: Family Medicine

## 2023-02-24 ENCOUNTER — Telehealth: Payer: Self-pay | Admitting: Family Medicine

## 2023-02-24 VITALS — BP 110/64 | HR 57 | Temp 98.7°F | Ht <= 58 in | Wt 86.0 lb

## 2023-02-24 DIAGNOSIS — N179 Acute kidney failure, unspecified: Secondary | ICD-10-CM | POA: Diagnosis not present

## 2023-02-24 DIAGNOSIS — L84 Corns and callosities: Secondary | ICD-10-CM | POA: Diagnosis not present

## 2023-02-24 DIAGNOSIS — R011 Cardiac murmur, unspecified: Secondary | ICD-10-CM | POA: Diagnosis not present

## 2023-02-24 DIAGNOSIS — E1159 Type 2 diabetes mellitus with other circulatory complications: Secondary | ICD-10-CM | POA: Diagnosis not present

## 2023-02-24 DIAGNOSIS — N184 Chronic kidney disease, stage 4 (severe): Secondary | ICD-10-CM

## 2023-02-24 DIAGNOSIS — I152 Hypertension secondary to endocrine disorders: Secondary | ICD-10-CM

## 2023-02-24 DIAGNOSIS — I499 Cardiac arrhythmia, unspecified: Secondary | ICD-10-CM

## 2023-02-24 DIAGNOSIS — Z681 Body mass index (BMI) 19 or less, adult: Secondary | ICD-10-CM | POA: Diagnosis not present

## 2023-02-24 DIAGNOSIS — M79676 Pain in unspecified toe(s): Secondary | ICD-10-CM | POA: Diagnosis not present

## 2023-02-24 DIAGNOSIS — E1142 Type 2 diabetes mellitus with diabetic polyneuropathy: Secondary | ICD-10-CM | POA: Diagnosis not present

## 2023-02-24 DIAGNOSIS — E1169 Type 2 diabetes mellitus with other specified complication: Secondary | ICD-10-CM | POA: Diagnosis not present

## 2023-02-24 DIAGNOSIS — E785 Hyperlipidemia, unspecified: Secondary | ICD-10-CM

## 2023-02-24 DIAGNOSIS — D631 Anemia in chronic kidney disease: Secondary | ICD-10-CM | POA: Diagnosis not present

## 2023-02-24 DIAGNOSIS — E1165 Type 2 diabetes mellitus with hyperglycemia: Secondary | ICD-10-CM | POA: Diagnosis not present

## 2023-02-24 DIAGNOSIS — B351 Tinea unguium: Secondary | ICD-10-CM | POA: Diagnosis not present

## 2023-02-24 MED ORDER — LANTUS SOLOSTAR 100 UNIT/ML ~~LOC~~ SOPN
26.0000 [IU] | PEN_INJECTOR | Freq: Every day | SUBCUTANEOUS | 99 refills | Status: DC
Start: 2023-02-24 — End: 2023-04-16

## 2023-02-24 MED ORDER — NOVOLOG FLEXPEN 100 UNIT/ML ~~LOC~~ SOPN
PEN_INJECTOR | SUBCUTANEOUS | 3 refills | Status: DC
Start: 2023-02-24 — End: 2023-06-22

## 2023-02-24 NOTE — Progress Notes (Signed)
Acute Office Visit  Subjective:  Patient ID: Kelly Castillo, female    DOB: May 24, 1934, 89 y.o.   MRN: [id number]  Chief Complaint  Patient presents with   Follow-up    Blood Sugar mgmt    HPI Patient is in today for follow up of BG management  She was admitted to [facility] 2/16-2/20 with AKI on CKD 3B with worsening anemia. She required 1 unit PRBC 2/17.  During that stay she was also treated for a UTI with possible yeast infection.  Upon discharge she was instructed to follow up with nephology and PCP  Discontinue lisinopril and metformin.  Since discontinuing medications, patient has not been able to gain control of BG   Began taking Januvia and Lantus  Seen at [facility] 01/27/23 and increased Lantus dose  Patient returned to ED 02/19/23 after following up with nephrology due to BG of >500. At this time she was started on Novolog.    With changes in BG patient denies blurry vision, denies changes to urine. Was previously checking ketones with home strips and was negative.  Patient is using Libre 3 to monitor her BG at home. Has been using Libre since 01/27/23 Since she has started using [place] she has not been in range. She remains above target with BG >200s. However, she is trending down and has not been above 400 since adding in Novolog.  Patient and daughter have been taking pre-prandial BG and using that value to determine dose of Novolog to administer following prescribed sliding scale.  Her daughter is coming over to help her with her injections. The patient lives with her son. She has not had any episodes of hypoglycemia.  Daughter felt that Lantus was not working.  Novo has been using at most 6 units.  Administers Lantus at bedtime.   ROS As per HPI   Objective:  BP 110/64   Pulse (!) 57   Temp 98.7 F (37.1 C)   Ht 4\' 10"  (1.473 m)   Wt 85 lb 15.7 oz (39 kg)   SpO2 99%   BMI 17.97 kg/m   Physical Exam Constitutional:      General: She is not in acute  distress.    Appearance: Normal appearance. She is underweight. She is not ill-appearing, toxic-appearing or diaphoretic.  Cardiovascular:     Rate and Rhythm: Bradycardia present. Rhythm irregular.     Heart sounds: Murmur heard.     No gallop.  Pulmonary:     Effort: Pulmonary effort is normal. No respiratory distress.     Breath sounds: Normal breath sounds. No stridor. No wheezing, rhonchi or rales.  Skin:    General: Skin is warm.     Capillary Refill: Capillary refill takes less than 2 seconds.  Neurological:     General: No focal deficit present.     Mental Status: She is alert and oriented to person, place, and time. Mental status is at baseline.     Motor: No weakness.  Psychiatric:        Mood and Affect: Mood normal.        Behavior: Behavior normal.        Thought Content: Thought content normal.        Judgment: Judgment normal.    Assessment & Plan:  1. Type 2 diabetes mellitus with hyperglycemia, without long-term current use of insulin Labs as below. Will communicate results to patient once available.  Vanice Sarah, PharmD present during visit today, appreciate recommendations. Will  discontinue Januvia as it is likely not impacting BG. Will increase Lantus to 26 units for greater basal coverage. Will prescribe Flexpen as below for patient and daughter to have greater control over dosing of novolog. Continue sliding scale for now as patient is trending down. Will follow up in 1 month for BG control.  As per February, patient A1C remained in goal.  Discussed with patient to continue to use [place].  Confirmed with patient and daughter the appropriate method of delivering insulin and measuring BG.  - CBC with Differential/Platelet - CMP14+EGFR - Magnesium - insulin glargine (LANTUS SOLOSTAR) 100 UNIT/ML Solostar Pen; Inject 26 Units into the skin at bedtime.  Dispense: 15 mL; Refill: PRN - insulin aspart (NOVOLOG FLEXPEN) 100 UNIT/ML FlexPen; Inject 6 Units into the skin  3 (three) times daily with meals. Novolog sliding scale. 121-150: 0 units, 151-200: 1 unit, 201-250: 2 units, 251-300: 3 units, 301-350: 4 units, 351-400: 5 units, >400: 6 units  Dispense: 15 mL; Refill: 3  2. Hyperlipidemia associated with type 2 diabetes mellitus Will need to add on Lipid Panel. Patient previously not at goal in 10/07/2022. Per prior PCP notes, 01/27/23, can consider switching from crestor to Lipitor, which is more renal-friendly.   3. Anemia due to stage 4 chronic kidney disease CBC as above. Will notify patient once available.   4. Stage 4 chronic kidney disease 5. AKI (acute kidney injury) Follows with nephrology. Will repeat CMP with GFR and collect Mg to monitor electrolytes and renal function given recent AKI on CKD. Patient has begun to improve, will monitor trend. Discussed with patient that even though her renal function is improving, we will conservatively consider returning to PO diabetes regimens.   6. Hypertension associated with type 2 diabetes mellitus Well controlled on current regimen.   7. Irregular heart beat EKG obtained today. Patient has history of SA. Confirmed on EKG reviewed by myself today in clinic, NSR with PACs and PVCs. Recommend patient follow with Cardiology. Patient daughter asked about continuing metoprolol. Discussed with them to ask cardiology for parameters for use. Patient was bradycardic today, asymptomatic. Will await recommendations from Cardiology. Per note from [place], NP 08/20/22 remains on metoprolol for CAD.  - EKG 12-Lead  8. Murmur, cardiac Managed by Cardiology. Most recent visit was 08/20/2022. Per chart review, patient has history of Mild mitral valve and tricuspid valve regurgitation as well as aortic valve regurgitation.   9. Body mass index (BMI) 19.9 or less, adult Discussed with patient and family that she can resume her diet. She received nephro, steady state shakes from another provider and inquired if it was okay for her  to continue drinking them. Discussed with patient to monitor BG with the shakes and to utilize the [place] to determine how she was affected, but that she was able to continue drinking shakes.   The above assessment and management plan was discussed with the patient. The patient verbalized understanding of and has agreed to the management plan using shared-decision making. Patient is aware to call the clinic if they develop any new symptoms or if symptoms fail to improve or worsen. Patient is aware when to return to the clinic for a follow-up visit. Patient educated on when it is appropriate to go to the emergency department.   Neale Burly, DNP-FNP [facility] [address] [place], [place] [address] [phone]

## 2023-02-24 NOTE — Telephone Encounter (Signed)
Agree with basal bolus approach Many meds are contraindicated due to small body habitus and GFR  Increase Lantus to 26-28 units Administer sliding scall as directed--may have to increase over time Current Inject 6 Units into the skin 3 (three) times daily with meals. Novolog sliding scale. 121-150: 0 units, 151-200: 1 unit, 201-250: 2 units, 251-300: 3 units, 301-350: 4 units, 351-400: 5 units, >400: 6 units  We could increase sliding scale by 1 unit each if current regimen not effective  Libre 3 CGM to monitor  Novolog vial switched to Flex pen; pen needles given  I'm happy to see patient in the future if needed--insulin titration will take time

## 2023-02-25 ENCOUNTER — Telehealth: Payer: Self-pay

## 2023-02-25 ENCOUNTER — Other Ambulatory Visit: Payer: Self-pay | Admitting: Family Medicine

## 2023-02-25 DIAGNOSIS — E1165 Type 2 diabetes mellitus with hyperglycemia: Secondary | ICD-10-CM

## 2023-02-25 DIAGNOSIS — D631 Anemia in chronic kidney disease: Secondary | ICD-10-CM

## 2023-02-25 DIAGNOSIS — E1169 Type 2 diabetes mellitus with other specified complication: Secondary | ICD-10-CM

## 2023-02-25 DIAGNOSIS — E1159 Type 2 diabetes mellitus with other circulatory complications: Secondary | ICD-10-CM

## 2023-02-25 DIAGNOSIS — I152 Hypertension secondary to endocrine disorders: Secondary | ICD-10-CM

## 2023-02-25 DIAGNOSIS — N184 Chronic kidney disease, stage 4 (severe): Secondary | ICD-10-CM

## 2023-02-25 LAB — CMP14+EGFR
ALT: 18 IU/L (ref 0–32)
AST: 21 IU/L (ref 0–40)
Albumin/Globulin Ratio: 1.8 (ref 1.2–2.2)
Albumin: 4.6 g/dL (ref 3.7–4.7)
Alkaline Phosphatase: 35 IU/L — ABNORMAL LOW (ref 44–121)
BUN/Creatinine Ratio: 15 (ref 12–28)
BUN: 31 mg/dL — ABNORMAL HIGH (ref 8–27)
Bilirubin Total: 0.3 mg/dL (ref 0.0–1.2)
CO2: 22 mmol/L (ref 20–29)
Calcium: 10.5 mg/dL — ABNORMAL HIGH (ref 8.7–10.3)
Chloride: 103 mmol/L (ref 96–106)
Creatinine, Ser: 2.03 mg/dL — ABNORMAL HIGH (ref 0.57–1.00)
Globulin, Total: 2.5 g/dL (ref 1.5–4.5)
Glucose: 190 mg/dL — ABNORMAL HIGH (ref 70–99)
Potassium: 4.5 mmol/L (ref 3.5–5.2)
Sodium: 139 mmol/L (ref 134–144)
Total Protein: 7.1 g/dL (ref 6.0–8.5)
eGFR: 23 mL/min/{1.73_m2} — ABNORMAL LOW (ref >59)

## 2023-02-25 LAB — CBC WITH DIFFERENTIAL/PLATELET
Basophils Absolute: 0.1 10*3/uL (ref 0.0–0.2)
Basos: 1 %
EOS (ABSOLUTE): 0.3 10*3/uL (ref 0.0–0.4)
Eos: 5 %
Hematocrit: 29.7 % — ABNORMAL LOW (ref 34.0–46.6)
Hemoglobin: 9.6 g/dL — ABNORMAL LOW (ref 11.1–15.9)
Immature Grans (Abs): 0 10*3/uL (ref 0.0–0.1)
Immature Granulocytes: 0 %
Lymphocytes Absolute: 1.6 10*3/uL (ref 0.7–3.1)
Lymphs: 29 %
MCH: 28.3 pg (ref 26.6–33.0)
MCHC: 32.3 g/dL (ref 31.5–35.7)
MCV: 88 fL (ref 79–97)
Monocytes Absolute: 0.4 10*3/uL (ref 0.1–0.9)
Monocytes: 8 %
Neutrophils Absolute: 3 10*3/uL (ref 1.4–7.0)
Neutrophils: 57 %
Platelets: 256 10*3/uL (ref 150–450)
RBC: 3.39 x10E6/uL — ABNORMAL LOW (ref 3.77–5.28)
RDW: 13.7 % (ref 11.7–15.4)
WBC: 5.4 10*3/uL (ref 3.4–10.8)

## 2023-02-25 LAB — MAGNESIUM: Magnesium: 2.4 mg/dL — ABNORMAL HIGH (ref 1.6–2.3)

## 2023-02-25 NOTE — Telephone Encounter (Signed)
     Patient  visit on 4/4  at [facility]   Have you been able to follow up with your primary care physician? Yes   The patient was or was not able to obtain any needed medicine or equipment. Yes   Are there diet recommendations that you are having difficulty following? Na   Patient expresses understanding of discharge instructions and education provided has no other needs at this time.  Yes      Kelly Castillo Pop [facility] Care Guide, [place] [phone] [address], [place], [place] [address] Phone: [phone] Email: [email or web address]    

## 2023-03-03 ENCOUNTER — Telehealth: Payer: Self-pay

## 2023-03-03 NOTE — Progress Notes (Signed)
  Care Management   Outreach Note  03/03/2023 Name: Kelly Castillo MRN: [id number] DOB: 06/12/1934  An unsuccessful telephone outreach was attempted today to contact the patient about Chronic Care Management needs.    Follow Up Plan:  A HIPAA compliant phone message was left for the patient providing contact information and requesting a return call.  The care management team will reach out to the patient again over the next 7 days.  If patient returns call to provider office, please advise to call Embedded Care Management Care Guide Penne Lash * at [phone] Lash, RMA Care Guide [facility]  [place], [place] [address] Direct Dial: [phone] [email or web address]

## 2023-03-06 ENCOUNTER — Other Ambulatory Visit: Payer: Self-pay | Admitting: Family Medicine

## 2023-03-06 DIAGNOSIS — I10 Essential (primary) hypertension: Secondary | ICD-10-CM

## 2023-03-06 NOTE — Progress Notes (Signed)
  Care Coordination  Note  03/06/2023 Name: [name] MRN: [id number] DOB: Feb 04, 1934  Kelly Castillo is a 89 y.o. year old female who is a primary care patient of Ellamae Sia, Aleen Campi, FNP. I reached out to [name] by phone today to offer care coordination services.      Ms. Gamboa was given information about Care Coordination services today including:  The Care Coordination services include support from the care team which includes your Nurse Coordinator, Clinical Social Worker, or Pharmacist.  The Care Coordination team is here to help remove barriers to the health concerns and goals most important to you. Care Coordination services are voluntary and the patient may decline or stop services at any time by request to their care team member.   Patient agreed to services and verbal consent obtained.   Follow up plan: Telephone appointment with care coordination team member scheduled for:03/26/2023  Penne Lash, RMA Care Guide [facility]  [place], [place] [address] Direct Dial: [phone] [email or web address]

## 2023-03-06 NOTE — Progress Notes (Signed)
  Care Management   Outreach Note  03/06/2023 Name: Kelly Castillo MRN: [id number] DOB: [birth year]  An unsuccessful telephone outreach was attempted today to contact the patient about Chronic Care Management needs.    Follow Up Plan:  A HIPAA compliant phone message was left for the patient providing contact information and requesting a return call.  The care management team will reach out to the patient again over the next 7 days.  If patient returns call to provider office, please advise to call Embedded Care Management Care Guide Penne Lash * at [phone] Lash, RMA Care Guide [facility]  [place], [place] [address] Direct Dial: [phone] [email or web address]

## 2023-03-11 ENCOUNTER — Ambulatory Visit (INDEPENDENT_AMBULATORY_CARE_PROVIDER_SITE_OTHER): Payer: Medicare HMO

## 2023-03-11 ENCOUNTER — Ambulatory Visit (INDEPENDENT_AMBULATORY_CARE_PROVIDER_SITE_OTHER): Payer: Medicare HMO | Admitting: Family Medicine

## 2023-03-11 ENCOUNTER — Encounter: Payer: Self-pay | Admitting: Family Medicine

## 2023-03-11 VITALS — BP 134/65 | HR 68 | Temp 97.7°F | Ht <= 58 in | Wt 91.2 lb

## 2023-03-11 DIAGNOSIS — R1903 Right lower quadrant abdominal swelling, mass and lump: Secondary | ICD-10-CM

## 2023-03-11 DIAGNOSIS — R1031 Right lower quadrant pain: Secondary | ICD-10-CM | POA: Diagnosis not present

## 2023-03-11 DIAGNOSIS — R1900 Intra-abdominal and pelvic swelling, mass and lump, unspecified site: Secondary | ICD-10-CM | POA: Diagnosis not present

## 2023-03-11 NOTE — Progress Notes (Signed)
Subjective:  Patient ID: Kelly Castillo, female    DOB: 10-03-1934, 89 y.o.   MRN: [id number]  Patient Care Team: Arrie Senate, FNP as PCP - General (Family Medicine) Jens Som Madolyn Frieze, MD as PCP - Cardiology (Cardiology) Jens Som Madolyn Frieze, MD as Consulting Physician (Cardiology) Terrial Rhodes, MD as Consulting Physician (Nephrology)   Chief Complaint:  Mass (Right side of abdomen x 6 days.  Patient states it has larger and has went down in size.)   HPI: Kelly Castillo is a 89 y.o. female presenting on 03/11/2023 for Mass (Right side of abdomen x 6 days.  Patient states it has larger and has went down in size.)   Pt presents today with complaints of a mass or swelling to her right abdomen. States she noticed this about a week ago. States it was slightly larger but is still present. Has been taking laxative to have a small bowel movement. Does have pain with palpation. No associated fever, chills, weakness, hematochezia, or melena.      Relevant past medical, surgical, family, and social history reviewed and updated as indicated.  Allergies and medications reviewed and updated. Data reviewed: Chart in Epic.   Past Medical History:  Diagnosis Date   Anemia    GERD (gastroesophageal reflux disease)    HTN (hypertension)    Hypercholesterolemia    LBBB (left bundle branch block)    Lumbar disc disease    Non-ischemic cardiomyopathy    Poor short term memory    Type II diabetes mellitus     Past Surgical History:  Procedure Laterality Date   APPENDECTOMY  1970's   [place]   CARDIAC CATHETERIZATION N/A 08/08/2015   Procedure: Left Heart Cath and Coronary Angiography;  Surgeon: Lyn Records, MD;  Location: [facility] INVASIVE CV LAB;  Service: Cardiovascular;  Laterality: N/A;   CARDIAC CATHETERIZATION  01/31/2014   CATARACT EXTRACTION W/ INTRAOCULAR LENS IMPLANT Right    CATARACT EXTRACTION W/PHACO  07/24/2011   Procedure: CATARACT EXTRACTION PHACO AND  INTRAOCULAR LENS PLACEMENT (IOC);  Surgeon: Gemma Payor;  Location: [facility] ORS;  Service: Ophthalmology;  Laterality: Left;  CDE: 14.73   CORONARY ARTERY BYPASS GRAFT N/A 08/10/2015   Procedure: OFF PUMP CORONARY ARTERY BYPASS GRAFTING (CABG) UTILIZING THE LEFT INTERNAL MAMMARY ARTERY;  Surgeon: Loreli Slot, MD;  Location: [facility] OR;  Service: Open Heart Surgery;  Laterality: N/A;   DILATION AND CURETTAGE OF UTERUS     EYE SURGERY Bilateral    "laser; related to diabetes"   FRACTURE SURGERY     LEFT HEART CATHETERIZATION WITH CORONARY ANGIOGRAM N/A 01/31/2014   Procedure: LEFT HEART CATHETERIZATION WITH CORONARY ANGIOGRAM;  Surgeon: Peter M Swaziland, MD;  Location: [facility] CATH LAB;  Service: Cardiovascular;  Laterality: N/A;   ORIF ANKLE FRACTURE Right 1980's   TEE WITHOUT CARDIOVERSION N/A 08/10/2015   Procedure: TRANSESOPHAGEAL ECHOCARDIOGRAM (TEE);  Surgeon: Loreli Slot, MD;  Location: [facility] OR;  Service: Open Heart Surgery;  Laterality: N/A;   TUBAL LIGATION     VAGINAL HYSTERECTOMY  1980's    Social History   Socioeconomic History   Marital status: Widowed    Spouse name: Not on file   Number of children: 10   Years of education: Not on file   Highest education level: Not on file  Occupational History   Occupation: Retired    Comment: Engineer, structural for [employer].  Tobacco Use   Smoking status: Never   Smokeless tobacco: Never  Substance and  Sexual Activity   Alcohol use: No    Alcohol/week: 0.0 standard drinks of alcohol   Drug use: No   Sexual activity: Yes    Birth control/protection: Surgical  Other Topics Concern   Not on file  Social History Narrative   9 children living and 1 deceased.   Lives with youngest Son. Daughter, [name], comes during the day to help patient with ADLs and stays until her brother gets home.    Social Determinants of Health   Financial Resource Strain: Low Risk  (09/23/2022)   Overall Financial Resource Strain (CARDIA)     Difficulty of Paying Living Expenses: Not hard at all  Food Insecurity: No Food Insecurity (01/02/2023)   Hunger Vital Sign    Worried About Running Out of Food in the Last Year: Never true    Ran Out of Food in the Last Year: Never true  Transportation Needs: No Transportation Needs (01/02/2023)   PRAPARE - Administrator, Civil Service (Medical): No    Lack of Transportation (Non-Medical): No  Physical Activity: Insufficiently Active (09/23/2022)   Exercise Vital Sign    Days of Exercise per Week: 2 days    Minutes of Exercise per Session: 20 min  Stress: No Stress Concern Present (09/23/2022)   [employer] of Occupational Health - Occupational Stress Questionnaire    Feeling of Stress : Not at all  Social Connections: Moderately Isolated (09/23/2022)   Social Connection and Isolation Panel [NHANES]    Frequency of Communication with Friends and Family: More than three times a week    Frequency of Social Gatherings with Friends and Family: More than three times a week    Attends Religious Services: 1 to 4 times per year    Active Member of [employer] or Organizations: No    Attends Banker Meetings: Never    Marital Status: Widowed  Intimate Partner Violence: Not At Risk (01/02/2023)   Humiliation, Afraid, Rape, and Kick questionnaire    Fear of Current or Ex-Partner: No    Emotionally Abused: No    Physically Abused: No    Sexually Abused: No    Outpatient Encounter Medications as of 03/11/2023  Medication Sig   acetaminophen (TYLENOL) 325 MG tablet Take 650 mg by mouth as needed.   amLODipine (NORVASC) 10 MG tablet Take 1 tablet (10 mg total) by mouth daily.   aspirin 81 MG tablet Take 1 tablet (81 mg total) by mouth daily.   BD PEN NEEDLE NANO 2ND GEN 32G X 4 MM MISC See admin instructions.   blood glucose meter kit and supplies May test up to 4 times a day. E11.9   Blood Glucose Monitoring Suppl DEVI Check BGs up to 4 times daily. May substitute to any  manufacturer covered by patient's insurance. E11.65   Cholecalciferol (VITAMIN D3) 125 MCG (5000 UT) TABS Take 1 tablet by mouth daily.   Continuous Blood Gluc Receiver (FREESTYLE LIBRE READER) DEVI 1 Units by Does not apply route daily. UAD to test BGs daily. Dx E11.65   Continuous Blood Gluc Sensor (FREESTYLE LIBRE 3 SENSOR) MISC Place 1 sensor on the skin every 14 days. Use to check glucose continuously. E11.65   ferrous sulfate 325 (65 FE) MG tablet TAKE 1 TABLET BY MOUTH EVERY DAY   glucose blood (ONETOUCH VERIO) test strip Check BS up to 4 times a day Dx E11.9   insulin aspart (NOVOLOG FLEXPEN) 100 UNIT/ML FlexPen Inject 6 Units into the skin 3 (three)  times daily with meals. Novolog sliding scale. 121-150: 0 units, 151-200: 1 unit, 201-250: 2 units, 251-300: 3 units, 301-350: 4 units, 351-400: 5 units, >400: 6 units   insulin glargine (LANTUS SOLOSTAR) 100 UNIT/ML Solostar Pen Inject 26 Units into the skin at bedtime.   Insulin Pen Needle (PEN NEEDLES) 33G X 4 MM MISC 1 each by Does not apply route daily.   Lancet Device MISC Check BGs 4 times daily. May substitute to any manufacturer covered by patient's insurance. E11.65   Lancets (ONETOUCH DELICA PLUS LANCET33G) MISC 4 (four) times daily.   Lancets Misc. MISC Check BGs 4 times daily. May substitute to any manufacturer covered by patient's insurance. E11.65   metoprolol tartrate (LOPRESSOR) 25 MG tablet TAKE (1) TABLET TWICE A DAY. (Patient taking differently: Take 25 mg by mouth daily. TAKE (1) TABLET TWICE A DAY.)   multivitamin (RENA-VIT) TABS tablet Take 1 tablet by mouth at bedtime.   Nutritional Supplements (FEEDING SUPPLEMENT, NEPRO CARB STEADY,) LIQD Take 237 mLs by mouth 3 (three) times daily with meals.   rosuvastatin (CRESTOR) 20 MG tablet Take 1 tablet (20 mg total) by mouth daily.   No facility-administered encounter medications on file as of 03/11/2023.    Allergies  Allergen Reactions   Penicillins Hives    Review of  Systems  Constitutional:  Negative for activity change, appetite change, chills, fatigue and fever.  HENT: Negative.    Eyes: Negative.   Respiratory:  Negative for cough, chest tightness and shortness of breath.   Cardiovascular:  Negative for chest pain, palpitations and leg swelling.  Gastrointestinal:  Positive for abdominal distention, abdominal pain and constipation. Negative for anal bleeding, blood in stool, diarrhea, nausea, rectal pain and vomiting.  Endocrine: Negative.   Genitourinary:  Negative for dysuria, frequency and urgency.  Musculoskeletal:  Negative for arthralgias and myalgias.  Skin: Negative.   Allergic/Immunologic: Negative.   Neurological:  Negative for dizziness and headaches.  Hematological: Negative.   Psychiatric/Behavioral:  Negative for confusion, hallucinations, sleep disturbance and suicidal ideas.   All other systems reviewed and are negative.       Objective:  BP 134/65   Pulse 68   Temp 97.7 F (36.5 C) (Temporal)   Ht  (1.473 m)   Wt 91 lb 3.2 oz (41.4 kg)   SpO2 97%   BMI 19.06 kg/m    Wt Readings from Last 3 Encounters:  03/11/23 91 lb 3.2 oz (41.4 kg)  02/24/23 85 lb 15.7 oz (39 kg)  02/19/23 86 lb (39 kg)    Physical Exam Vitals and nursing note reviewed.  Constitutional:      General: She is not in acute distress.    Appearance: Normal appearance. She is not ill-appearing, toxic-appearing or diaphoretic.  HENT:     Head: Normocephalic and atraumatic.     Mouth/Throat:     Mouth: Mucous membranes are moist.  Eyes:     Pupils: Pupils are equal, round, and reactive to light.  Cardiovascular:     Rate and Rhythm: Normal rate and regular rhythm.     Heart sounds: Murmur heard.     Systolic murmur is present.  Pulmonary:     Effort: Pulmonary effort is normal.     Breath sounds: Normal breath sounds.  Abdominal:     General: Abdomen is protuberant. There is distension.     Tenderness: There is abdominal tenderness in  the right upper quadrant and right lower quadrant.  Comments: Distension with outward protrusion of right abdomen. Left abdomen pulled inward. Tenderness to palpation of right side of abdomen.   Musculoskeletal:     Right lower leg: No edema.     Left lower leg: No edema.  Skin:    General: Skin is warm and dry.     Capillary Refill: Capillary refill takes less than 2 seconds.  Neurological:     General: No focal deficit present.     Mental Status: She is alert and oriented to person, place, and time.     Gait: Gait abnormal (using rolling walker).     Results for orders placed or performed in visit on 02/24/23  CBC with Differential/Platelet  Result Value Ref Range   WBC 5.4 3.4 - 10.8 x10E3/uL   RBC 3.39 (L) 3.77 - 5.28 x10E6/uL   Hemoglobin 9.6 (L) 11.1 - 15.9 g/dL   Hematocrit 16.1 (L) 09.6 - 46.6 %   MCV 88 79 - 97 fL   MCH 28.3 26.6 - 33.0 pg   MCHC 32.3 31.5 - 35.7 g/dL   RDW 04.5 40.9 - 81.1 %   Platelets 256 150 - 450 x10E3/uL   Neutrophils 57 Not Estab. %   Lymphs 29 Not Estab. %   Monocytes 8 Not Estab. %   Eos 5 Not Estab. %   Basos 1 Not Estab. %   Neutrophils Absolute 3.0 1.4 - 7.0 x10E3/uL   Lymphocytes Absolute 1.6 0.7 - 3.1 x10E3/uL   Monocytes Absolute 0.4 0.1 - 0.9 x10E3/uL   EOS (ABSOLUTE) 0.3 0.0 - 0.4 x10E3/uL   Basophils Absolute 0.1 0.0 - 0.2 x10E3/uL   Immature Granulocytes 0 Not Estab. %   Immature Grans (Abs) 0.0 0.0 - 0.1 x10E3/uL  CMP14+EGFR  Result Value Ref Range   Glucose 190 (H) 70 - 99 mg/dL   BUN 31 (H) 8 - 27 mg/dL   Creatinine, Ser 9.14 (H) 0.57 - 1.00 mg/dL   eGFR 23 (L) >78 GN/FAO/1.30   BUN/Creatinine Ratio 15 12 - 28   Sodium 139 134 - 144 mmol/L   Potassium 4.5 3.5 - 5.2 mmol/L   Chloride 103 96 - 106 mmol/L   CO2 22 20 - 29 mmol/L   Calcium 10.5 (H) 8.7 - 10.3 mg/dL   Total Protein 7.1 6.0 - 8.5 g/dL   Albumin 4.6 3.7 - 4.7 g/dL   Globulin, Total 2.5 1.5 - 4.5 g/dL   Albumin/Globulin Ratio 1.8 1.2 - 2.2    Bilirubin Total 0.3 0.0 - 1.2 mg/dL   Alkaline Phosphatase 35 (L) 44 - 121 IU/L   AST 21 0 - 40 IU/L   ALT 18 0 - 32 IU/L  Magnesium  Result Value Ref Range   Magnesium 2.4 (H) 1.6 - 2.3 mg/dL     X-Ray: KUB: Significant stool burden noted on right side of colon. Preliminary x-ray reading by Kari Baars, FNP-C, [facility].   Pertinent labs & imaging results that were available during my care of the patient were reviewed by me and considered in my medical decision making.  Assessment & Plan:  Kelly Castillo was seen today for mass.  Diagnoses and all orders for this visit:  Right lower quadrant abdominal mass RLQ abdominal pain KUB with significant stool burden noted in right colon. Tenderness with palpation. Concerning for possible megacolon or large bowel obstruction. Will obtain CT for further evaluation. Further treatment pending results.  -     DG Abd 1 View -     CT Abdomen  Pelvis Wo Contrast; Future    Continue all other maintenance medications.  Follow up plan: Return if symptoms worsen or fail to improve.   The above assessment and management plan was discussed with the patient. The patient verbalized understanding of and has agreed to the management plan. Patient is aware to call the clinic if they develop any new symptoms or if symptoms persist or worsen. Patient is aware when to return to the clinic for a follow-up visit. Patient educated on when it is appropriate to go to the emergency department.   Kari Baars, FNP-C [facility] [phone]

## 2023-03-12 ENCOUNTER — Ambulatory Visit (HOSPITAL_COMMUNITY)
Admission: RE | Admit: 2023-03-12 | Discharge: 2023-03-12 | Disposition: A | Discharge status: Home / Self Care | Payer: Medicare HMO | Source: Ambulatory Visit | Attending: Family Medicine | Admitting: Family Medicine

## 2023-03-12 DIAGNOSIS — R1903 Right lower quadrant abdominal swelling, mass and lump: Secondary | ICD-10-CM | POA: Insufficient documentation

## 2023-03-12 DIAGNOSIS — N281 Cyst of kidney, acquired: Secondary | ICD-10-CM | POA: Diagnosis not present

## 2023-03-12 DIAGNOSIS — R1031 Right lower quadrant pain: Secondary | ICD-10-CM | POA: Diagnosis not present

## 2023-03-12 MED ORDER — IOHEXOL 9 MG/ML PO SOLN
ORAL | Status: AC
  Filled 2023-03-12: qty 1000

## 2023-03-26 ENCOUNTER — Ambulatory Visit (INDEPENDENT_AMBULATORY_CARE_PROVIDER_SITE_OTHER): Payer: Medicare HMO | Admitting: Pharmacist

## 2023-03-26 ENCOUNTER — Encounter: Payer: Self-pay | Admitting: Pharmacist

## 2023-03-26 VITALS — BP 111/75 | HR 65

## 2023-03-26 DIAGNOSIS — Z794 Long term (current) use of insulin: Secondary | ICD-10-CM | POA: Diagnosis not present

## 2023-03-26 DIAGNOSIS — E1165 Type 2 diabetes mellitus with hyperglycemia: Secondary | ICD-10-CM

## 2023-03-26 NOTE — Progress Notes (Signed)
03/26/2023 Name: ABIRA KOKAL MRN: [id number] DOB: 05/29/1934  Chief Complaint  Patient presents with   Diabetes    NELTA DUNSFORD is a 89 y.o. year old female who was referred for medication management by their primary care provider, Ellamae Sia, Aleen Campi, FNP. They presented for a face to face visit today.   They were referred to the pharmacist by their PCP for assistance in managing diabetes   Subjective:  Care Team: Primary Care Provider: Arrie Senate, FNP ; Next Scheduled Visit: TBD   Medication Access/Adherence  Current Pharmacy:  [phone] [place] [place] - [address] [place] [address] Phone: [phone] Fax: [phone]   Patient reports affordability concerns with their medications: No  Patient reports access/transportation concerns to their pharmacy: No  Patient reports adherence concerns with their medications:  No     Diabetes:  Current medications:  Lantus, novolog--patient has only been injecting lantus 6 units at night--10 units caused hypoglycemia reported by daughter Medications tried in the past: januvia, metformin  Current glucose readings: blood sugars were in the 300-400s so have improved  Using libre 3 CGM  -Patient is testing blood sugar 6 times daily -Patient is injecting insulin 4 or more times daily -She greatly benefits from her continuous glucose monitoring system (Iibre)  Patient reports 1 hypoglycemic event-but sugar bounced right back; could have been a potential overnight compression low Patient denies hyperglycemic symptoms including polyuria, polydipsia, polyphagia, nocturia, neuropathy, blurred vision.  Current physical activity: limited due to age  Current medication access support: n/a   Objective:  Lab Results  Component Value Date   HGBA1C 4.9 01/02/2023    Lab Results  Component Value Date   CREATININE 2.03 (H) 02/24/2023   BUN 31 (H) 02/24/2023    NA 139 02/24/2023   K 4.5 02/24/2023   CL 103 02/24/2023   CO2 22 02/24/2023    Lab Results  Component Value Date   CHOL 197 10/07/2022   HDL 46 10/07/2022   LDLCALC 119 (H) 10/07/2022   TRIG 182 (H) 10/07/2022   CHOLHDL 4.3 10/07/2022    Medications Reviewed Today     Reviewed by Sonny Masters, FNP (Family Nurse Practitioner) on 03/11/23 at 1426  Med List Status: <None>   Medication Order Taking? Sig Documenting Provider Last Dose Status Informant  acetaminophen (TYLENOL) 325 MG tablet [id number] Yes Take 650 mg by mouth as needed. [provider] Taking Active Pharmacy Records, Child  amLODipine (NORVASC) 10 MG tablet [id number] Yes Take 1 tablet (10 mg total) by mouth daily. Gabriel Earing, FNP Taking Active   aspirin 81 MG tablet [id number] Yes Take 1 tablet (81 mg total) by mouth daily. Lewayne Bunting, MD Taking Active Pharmacy Records, Child  BD PEN NEEDLE NANO 2ND GEN 32G X 4 MM MISC [id number] Yes See admin instructions. [provider] Taking Active   blood glucose meter kit and supplies [id number] Yes May test up to 4 times a day. E11.9 Daryll Drown, NP Taking Active Pharmacy Records, Child  Blood Glucose Monitoring Suppl DEVI [id number] Yes Check BGs up to 4 times daily. May substitute to any manufacturer covered by patient's insurance. E11.65 Raliegh Ip, DO Taking Active   Cholecalciferol (VITAMIN D3) 125 MCG (5000 UT) TABS [id number] Yes Take 1 tablet by mouth daily. [provider] Taking Active Child, Pharmacy Records  Continuous Blood Gluc Receiver (FREESTYLE LIBRE READER) DEVI [id number] Yes 1 Units by Does  not apply route daily. UAD to test BGs daily. Dx E11.65 Raliegh Ip, DO Taking Active   Continuous Blood Gluc Sensor (FREESTYLE LIBRE 3 SENSOR) [place] [id number] Yes Place 1 sensor on the skin every 14 days. Use to check glucose continuously. E11.65 Raliegh Ip, DO Taking Active   ferrous sulfate 325 (65 FE) MG  tablet [id number] Yes TAKE 1 TABLET BY MOUTH EVERY DAY Milian, Aleen Campi, FNP Taking Active   glucose blood (ONETOUCH VERIO) test strip [id number] Yes Check BS up to 4 times a day Dx E11.9 Delynn Flavin M, DO Taking Active   insulin aspart (NOVOLOG FLEXPEN) 100 UNIT/ML FlexPen [id number] Yes Inject 6 Units into the skin 3 (three) times daily with meals. Novolog sliding scale. 121-150: 0 units, 151-200: 1 unit, 201-250: 2 units, 251-300: 3 units, 301-350: 4 units, 351-400: 5 units, >400: 6 units Milian, Aleen Campi, FNP Taking Active   insulin glargine (LANTUS SOLOSTAR) 100 UNIT/ML Solostar Pen [id number] Yes Inject 26 Units into the skin at bedtime. Arrie Senate, FNP Taking Active   Insulin Pen Needle (PEN NEEDLES) 33G X 4 MM MISC [id number] Yes 1 each by Does not apply route daily. Bennie Pierini, FNP Taking Active   Lancet Device MISC [id number] Yes Check BGs 4 times daily. May substitute to any manufacturer covered by patient's insurance. E11.65 Raliegh Ip, DO Taking Active   Lancets [facility] DELICA PLUS [place]) MISC [id number] Yes 4 (four) times daily. [provider] Taking Active   Lancets Misc. MISC [id number] Yes Check BGs 4 times daily. May substitute to any manufacturer covered by patient's insurance. E11.65 Raliegh Ip, DO Taking Active   metoprolol tartrate (LOPRESSOR) 25 MG tablet [id number] Yes TAKE (1) TABLET TWICE A DAY.  Patient taking differently: Take 25 mg by mouth daily. TAKE (1) TABLET TWICE A DAY.   Daryll Drown, NP Taking Active Pharmacy Records, Child  multivitamin (RENA-VIT) TABS tablet [id number] Yes Take 1 tablet by mouth at bedtime. Gabriel Earing, FNP Taking Active   Nutritional Supplements (FEEDING SUPPLEMENT, NEPRO CARB STEADY,) LIQD [id number] Yes Take 237 mLs by mouth 3 (three) times daily with meals. Sherryll Burger, Pratik D, DO Taking Active   rosuvastatin (CRESTOR) 20 MG tablet [id number] Yes Take 1 tablet (20 mg total)  by mouth daily. Gabriel Earing, FNP Taking Active   Med List Note Tresa Endo 07/18/11 7628): Completed by York Cerise              Assessment/Plan:   Diabetes: - Currently uncontrolled--newer CKD, GFR 23 and recent ED admission - Reviewed long term cardiovascular and renal outcomes of uncontrolled blood sugar - Reviewed goal A1c, goal fasting, and goal 2 hour post prandial glucose - Reviewed dietary modifications including healthier protein bar options, low sugar/low carb - Reviewed lifestyle modifications including: - Recommend to Continue Lantus 6-7 units a bedtime; increase Novolog sliding scale by 1 unit each (printed new sliding scale); body habitus/low body weight and CKD limit additional non-insulin options for T2DM treatment -Consider SGLT2 in the future once better glycemic control (increased risk for side effects) - Recommend to check glucose using libre 3 CGM   Follow Up Plan: 1 month pharmD  Kieth Brightly, PharmD, BCACP Clinical Pharmacist, [facility]

## 2023-04-02 ENCOUNTER — Telehealth: Payer: Self-pay | Admitting: Family Medicine

## 2023-04-02 NOTE — Telephone Encounter (Signed)
Contacted Kelly Castillo to schedule their annual wellness visit. Appointment made for 09/25/2023.  Thank you,  Judeth Cornfield,  AMB Clinical Support [facility] AWV Program Direct Dial ??[phone]

## 2023-04-15 ENCOUNTER — Other Ambulatory Visit: Payer: Self-pay | Admitting: Family Medicine

## 2023-04-15 DIAGNOSIS — E1165 Type 2 diabetes mellitus with hyperglycemia: Secondary | ICD-10-CM

## 2023-04-17 ENCOUNTER — Other Ambulatory Visit: Payer: Self-pay | Admitting: Family Medicine

## 2023-04-22 ENCOUNTER — Telehealth: Payer: Self-pay | Admitting: Family Medicine

## 2023-04-22 ENCOUNTER — Ambulatory Visit (INDEPENDENT_AMBULATORY_CARE_PROVIDER_SITE_OTHER): Payer: Medicare HMO | Admitting: Pharmacist

## 2023-04-22 DIAGNOSIS — E1165 Type 2 diabetes mellitus with hyperglycemia: Secondary | ICD-10-CM

## 2023-04-22 NOTE — Telephone Encounter (Signed)
  Prescription Request  04/22/2023  Is this a "Controlled Substance" medicine? no  Have you seen your PCP in the last 2 weeks? no  If YES, route message to pool  -  If NO, patient needs to be scheduled for appointment.  What is the name of the medication or equipment? Insulin glargine 100 Unit/mL for only thirty days per daughter. For #90 is $90 and don't need that much at one time. Also needs RX for FreeStyle Libre sensor covers so patient want pull off.  Have you contacted your pharmacy to request a refill? YES   [facility] [place]   Patient notified that their request is being sent to the clinical staff for review and that they should receive a response within 2 business days.

## 2023-04-23 NOTE — Telephone Encounter (Signed)
Informed pt that the pharmacy can divide a 90-d script into a 30-d script. Per pharmacy needs a script for Tegaderm to cover [place] sensor, if this script does not go through there is a sample  at the front so she can try to buy it over the counter.

## 2023-05-01 ENCOUNTER — Telehealth: Payer: Self-pay | Admitting: Family Medicine

## 2023-05-01 NOTE — Telephone Encounter (Signed)
Pts daughter called to speak with Raynelle Fanning. Says pt keeps complaining about the libre 3 sensors irritating her skin. Wants to know if other patients are complaining of this? Wants to know if there are other options for pt?

## 2023-05-06 NOTE — Progress Notes (Signed)
04/22/2023 Name: Kelly Castillo        MRN: [id number]       DOB: [birth year]      Chief Complaint  Patient presents with   Diabetes      Kelly Castillo is a 89 y.o. year old female who was referred for medication management by their primary care provider, Ellamae Sia, Aleen Campi, FNP.   They were referred to the pharmacist by their PCP for assistance in managing diabetes    Subjective:   Care Team: Primary Care Provider: Arrie Senate, FNP ; Next Scheduled Visit: TBD     Medication Access/Adherence   Current Pharmacy:  [id number] - [place], [place] - [address] [place] [address] Phone: [phone] Fax: [phone]     Patient reports affordability concerns with their medications: No  Patient reports access/transportation concerns to their pharmacy: No  Patient reports adherence concerns with their medications:  No       Diabetes:   Current medications:  Lantus, novolog--patient has only been injecting lantus 4 units at night--10 units caused hypoglycemia reported by daughter so they are cautious  Medications tried in the past: januvia, metformin   Current glucose readings: blood sugars were in the 300-400s so have improved  Using libre 3 CGM   -Patient is testing blood sugar 6 times daily -Patient is injecting insulin 4 or more times daily -She greatly benefits from her continuous glucose monitoring system (Iibre)   Patient reports 1 hypoglycemic event-but sugar bounced right back; could have been a potential overnight compression low Patient denies hyperglycemic symptoms including polyuria, polydipsia, polyphagia, nocturia, neuropathy, blurred vision.   Current physical activity: limited due to age   Current medication access support: n/a     Objective:   Recent Labs       Lab Results  Component Value Date    HGBA1C 4.9 01/02/2023        Recent Labs       Lab Results  Component Value Date     CREATININE 2.03 (H) 02/24/2023    BUN 31 (H) 02/24/2023    NA 139 02/24/2023    K 4.5 02/24/2023    CL 103 02/24/2023    CO2 22 02/24/2023        Recent Labs       Lab Results  Component Value Date    CHOL 197 10/07/2022    HDL 46 10/07/2022    LDLCALC 119 (H) 10/07/2022    TRIG 182 (H) 10/07/2022    CHOLHDL 4.3 10/07/2022        Medications Reviewed Today       Reviewed by Sonny Masters, FNP (Family Nurse Practitioner) on 03/11/23 at 1426  Med List Status: <None>    Medication Order Taking? Sig Documenting Provider Last Dose Status Informant  acetaminophen (TYLENOL) 325 MG tablet [id number] Yes Take 650 mg by mouth as needed. [provider] Taking Active Pharmacy Records, Child  amLODipine (NORVASC) 10 MG tablet [id number] Yes Take 1 tablet (10 mg total) by mouth daily. Gabriel Earing, FNP Taking Active    aspirin 81 MG tablet [id number] Yes Take 1 tablet (81 mg total) by mouth daily. Lewayne Bunting, MD Taking Active Pharmacy Records, Child  BD PEN NEEDLE NANO 2ND GEN 32G X 4 MM MISC [id number] Yes See admin instructions. [provider] Taking Active    blood glucose meter kit and supplies [id number] Yes May test up to  4 times a day. E11.9 Daryll Drown, NP Taking Active Pharmacy Records, Child  Blood Glucose Monitoring Suppl DEVI [id number] Yes Check BGs up to 4 times daily. May substitute to any manufacturer covered by patient's insurance. E11.65 Raliegh Ip, DO Taking Active    Cholecalciferol (VITAMIN D3) 125 MCG (5000 UT) TABS [id number] Yes Take 1 tablet by mouth daily. [provider] Taking Active Child, Pharmacy Records  Continuous Blood Gluc Receiver (FREESTYLE LIBRE READER) DEVI [id number] Yes 1 Units by Does not apply route daily. UAD to test BGs daily. Dx E11.65 Raliegh Ip, DO Taking Active    Continuous Blood Gluc Sensor (FREESTYLE LIBRE 3 SENSOR) [place] [id number] Yes Place 1 sensor on the skin every 14 days. Use to check  glucose continuously. E11.65 Raliegh Ip, DO Taking Active    ferrous sulfate 325 (65 FE) MG tablet [id number] Yes TAKE 1 TABLET BY MOUTH EVERY DAY Milian, Aleen Campi, FNP Taking Active    glucose blood (ONETOUCH VERIO) test strip [id number] Yes Check BS up to 4 times a day Dx E11.9 Delynn Flavin M, DO Taking Active    insulin aspart (NOVOLOG FLEXPEN) 100 UNIT/ML FlexPen [id number] Yes Inject 6 Units into the skin 3 (three) times daily with meals. Novolog sliding scale. 121-150: 0 units, 151-200: 1 unit, 201-250: 2 units, 251-300: 3 units, 301-350: 4 units, 351-400: 5 units, >400: 6 units Milian, Aleen Campi, FNP Taking Active    insulin glargine (LANTUS SOLOSTAR) 100 UNIT/ML Solostar Pen [id number] Yes Inject 26 Units into the skin at bedtime. Arrie Senate, FNP Taking Active    Insulin Pen Needle (PEN NEEDLES) 33G X 4 MM MISC [id number] Yes 1 each by Does not apply route daily. Bennie Pierini, FNP Taking Active    Lancet Device MISC [id number] Yes Check BGs 4 times daily. May substitute to any manufacturer covered by patient's insurance. E11.65 Raliegh Ip, DO Taking Active    Lancets [facility] DELICA PLUS [place]) MISC [id number] Yes 4 (four) times daily. [provider] Taking Active    Lancets Misc. MISC [id number] Yes Check BGs 4 times daily. May substitute to any manufacturer covered by patient's insurance. E11.65 Raliegh Ip, DO Taking Active    metoprolol tartrate (LOPRESSOR) 25 MG tablet [id number] Yes TAKE (1) TABLET TWICE A DAY.  Patient taking differently: Take 25 mg by mouth daily. TAKE (1) TABLET TWICE A DAY.   Daryll Drown, NP Taking Active Pharmacy Records, Child  multivitamin (RENA-VIT) TABS tablet [id number] Yes Take 1 tablet by mouth at bedtime. Gabriel Earing, FNP Taking Active    Nutritional Supplements (FEEDING SUPPLEMENT, NEPRO CARB STEADY,) LIQD [id number] Yes Take 237 mLs by mouth 3 (three) times daily with meals.  Sherryll Burger, Pratik D, DO Taking Active    rosuvastatin (CRESTOR) 20 MG tablet [id number] Yes Take 1 tablet (20 mg total) by mouth daily. Gabriel Earing, FNP Taking Active    Med List Note Tresa Endo 07/18/11 8182): Completed by York Cerise                      Assessment/Plan:    Diabetes: - Currently uncontrolled--newer CKD, GFR 23  - Reviewed long term cardiovascular and renal outcomes of uncontrolled blood sugar - Reviewed goal A1c, goal fasting, and goal 2 hour post prandial glucose - Reviewed dietary modifications including healthier protein bar options, low sugar/low carb - Reviewed lifestyle modifications including: - Recommend to increase Lantus 6-7 units a bedtime; increase  Novolog sliding scale by 1 unit each (printed new sliding scale); body habitus/low body weight and CKD limit additional non-insulin options for T2DM treatment  Family is hesitant to give more insulin; they are only giving 4-6 units daily of Lantus  Based on diet and CGM reports, patient needs more basal/bolus insulin  Encouraged daughter to increase Lantus to 6-7 units with careful monitoring   And continue sliding scale as directed -Consider SGLT2 in the future once better glycemic control (increased risk for side effects) - Recommend to check glucose using libre 3 CGM     Follow Up Plan: 1 month pharmD   Kieth Brightly, PharmD, BCACP Clinical Pharmacist, [facility]

## 2023-05-07 NOTE — Telephone Encounter (Signed)
Please let patient's daughter know:  All CGM systems can be irritating (or anything with adhesive)--I have seen less irritation with the libre 3 to be honest Make sure you are rotating the site 2. You can use a flonase OTC nasal spray on the arm, spray on arm-->let dry then apply libre 3 sensor;  this will help with irritation;  Steroid creams would be too greasy

## 2023-05-07 NOTE — Telephone Encounter (Signed)
Sister states irritation has resolved

## 2023-05-30 ENCOUNTER — Other Ambulatory Visit: Payer: Self-pay | Admitting: Family Medicine

## 2023-05-30 DIAGNOSIS — I10 Essential (primary) hypertension: Secondary | ICD-10-CM

## 2023-06-19 ENCOUNTER — Ambulatory Visit (INDEPENDENT_AMBULATORY_CARE_PROVIDER_SITE_OTHER): Payer: Medicare HMO | Admitting: Internal Medicine

## 2023-06-19 ENCOUNTER — Encounter: Payer: Self-pay | Admitting: Internal Medicine

## 2023-06-19 VITALS — BP 158/60 | HR 63 | Temp 98.0°F | Ht <= 58 in

## 2023-06-19 DIAGNOSIS — E785 Hyperlipidemia, unspecified: Secondary | ICD-10-CM

## 2023-06-19 DIAGNOSIS — E1169 Type 2 diabetes mellitus with other specified complication: Secondary | ICD-10-CM

## 2023-06-19 DIAGNOSIS — Z794 Long term (current) use of insulin: Secondary | ICD-10-CM

## 2023-06-19 DIAGNOSIS — D631 Anemia in chronic kidney disease: Secondary | ICD-10-CM

## 2023-06-19 DIAGNOSIS — I10 Essential (primary) hypertension: Secondary | ICD-10-CM | POA: Diagnosis not present

## 2023-06-19 DIAGNOSIS — E118 Type 2 diabetes mellitus with unspecified complications: Secondary | ICD-10-CM

## 2023-06-19 DIAGNOSIS — N184 Chronic kidney disease, stage 4 (severe): Secondary | ICD-10-CM

## 2023-06-19 DIAGNOSIS — Z7984 Long term (current) use of oral hypoglycemic drugs: Secondary | ICD-10-CM | POA: Diagnosis not present

## 2023-06-19 LAB — LIPID PANEL
Cholesterol: 164 mg/dL (ref 0–200)
HDL: 78.4 mg/dL (ref >39.00)
LDL Cholesterol: 69 mg/dL (ref 0–99)
NonHDL: 85.79
Total CHOL/HDL Ratio: 2
Triglycerides: 83 mg/dL (ref 0.0–149.0)
VLDL: 16.6 mg/dL (ref 0.0–40.0)

## 2023-06-19 LAB — CBC
HCT: 31.7 % — ABNORMAL LOW (ref 36.0–46.0)
Hemoglobin: 10.3 g/dL — ABNORMAL LOW (ref 12.0–15.0)
MCHC: 32.4 g/dL (ref 30.0–36.0)
MCV: 83.8 fl (ref 78.0–100.0)
Platelets: 295 10*3/uL (ref 150.0–400.0)
RBC: 3.79 Mil/uL — ABNORMAL LOW (ref 3.87–5.11)
RDW: 17 % — ABNORMAL HIGH (ref 11.5–15.5)
WBC: 5.4 10*3/uL (ref 4.0–10.5)

## 2023-06-19 LAB — HEPATIC FUNCTION PANEL
ALT: 16 U/L (ref 0–35)
AST: 26 U/L (ref 0–37)
Albumin: 4.7 g/dL (ref 3.5–5.2)
Alkaline Phosphatase: 34 U/L — ABNORMAL LOW (ref 39–117)
Bilirubin, Direct: 0.1 mg/dL (ref 0.0–0.3)
Total Bilirubin: 0.6 mg/dL (ref 0.2–1.2)
Total Protein: 8 g/dL (ref 6.0–8.3)

## 2023-06-19 LAB — RENAL FUNCTION PANEL
Albumin: 4.7 g/dL (ref 3.5–5.2)
BUN: 33 mg/dL — ABNORMAL HIGH (ref 6–23)
CO2: 26 mEq/L (ref 19–32)
Calcium: 10.3 mg/dL (ref 8.4–10.5)
Chloride: 103 mEq/L (ref 96–112)
Creatinine, Ser: 1.6 mg/dL — ABNORMAL HIGH (ref 0.40–1.20)
GFR: 28.83 mL/min — ABNORMAL LOW (ref >60.00)
Glucose, Bld: 198 mg/dL — ABNORMAL HIGH (ref 70–99)
Phosphorus: 3.5 mg/dL (ref 2.3–4.6)
Potassium: 4.5 mEq/L (ref 3.5–5.1)
Sodium: 136 mEq/L (ref 135–145)

## 2023-06-19 LAB — HEMOGLOBIN A1C: Hgb A1c MFr Bld: 6 % (ref 4.6–6.5)

## 2023-06-19 MED ORDER — METOPROLOL SUCCINATE ER 25 MG PO TB24
25.0000 mg | ORAL_TABLET | Freq: Every day | ORAL | 3 refills | Status: DC
Start: 2023-06-19 — End: 2024-06-02

## 2023-06-19 NOTE — Assessment & Plan Note (Signed)
Checking CBC today. Adjust as needed.

## 2023-06-19 NOTE — Assessment & Plan Note (Signed)
Unable to view visit from nephrology and they did not get call back about labs. Checking renal function panel and hepatic function panel. BP at goal at home and elevated after walking in office returned to normal with rest. See hypertension for full details about BP regimen.

## 2023-06-19 NOTE — Patient Instructions (Signed)
We will check the labs today.  We have sent in the metoprolol to take once a day instead of twice so when you refill it ask for the new one.  We will likely stop the novolog shots and restart glimepiride 1 mg daily after we get the labs.

## 2023-06-19 NOTE — Assessment & Plan Note (Signed)
Checking lipid panel and adjust crestor 20 mg daily as needed for LDL <100 goal.

## 2023-06-19 NOTE — Assessment & Plan Note (Signed)
Taking metoprolol 25 mg BID (but only taking daily). Will switch to metoprolol xl 25 mg daily. Keep amlodipine 10 mg daily. Checking renal function and adjust as needed.

## 2023-06-19 NOTE — Progress Notes (Signed)
   Subjective:   Patient ID: Kelly Castillo, female    DOB: [birth year], 89 y.o.   MRN: [id number]  HPI The patient is a new 89 YO female coming in for concerns and ongoing care. See A/P for details.   PMH, [facility], social history reviewed and updated  Review of Systems  Constitutional: Negative.   HENT: Negative.    Eyes: Negative.   Respiratory:  Negative for cough, chest tightness and shortness of breath.   Cardiovascular:  Negative for chest pain, palpitations and leg swelling.  Gastrointestinal:  Negative for abdominal distention, abdominal pain, constipation, diarrhea, nausea and vomiting.  Musculoskeletal: Negative.   Skin: Negative.   Neurological: Negative.   Psychiatric/Behavioral: Negative.      Objective:  Physical Exam Constitutional:      Appearance: She is well-developed.  HENT:     Head: Normocephalic and atraumatic.  Cardiovascular:     Rate and Rhythm: Normal rate and regular rhythm.  Pulmonary:     Effort: Pulmonary effort is normal. No respiratory distress.     Breath sounds: Normal breath sounds. No wheezing or rales.  Abdominal:     General: Bowel sounds are normal. There is no distension.     Palpations: Abdomen is soft.     Tenderness: There is no abdominal tenderness. There is no rebound.  Musculoskeletal:     Cervical back: Normal range of motion.  Skin:    General: Skin is warm and dry.  Neurological:     Mental Status: She is alert and oriented to person, place, and time.     Coordination: Coordination normal.     Vitals:   06/19/23 1041 06/19/23 1043  BP: (!) 160/90 (!) 160/90  Pulse: 63   Temp: 98 F (36.7 C)   TempSrc: Oral   SpO2: 98%   Height: 4\' 10"  (1.473 m)     Assessment & Plan:  Visit time 35 minutes in face to face communication with patient and coordination of care, additional 10 minutes spent in record review, coordination or care, ordering tests, communicating/referring to other healthcare professionals, documenting in  medical records all on the same day of the visit for total time 45 minutes spent on the visit.

## 2023-06-19 NOTE — Assessment & Plan Note (Signed)
She is current taking novolog via sliding scale (sometimes daughter does not live with her and has to administer using freestyle libre values mostly in green except after eating and doing lantus 6 units daily. We will have her move this to morning for schedule ease. Checking HgA1c and if stable will stop novolog and start glimepiride 1 mg daily. Follow up 3 months. Is on statin. No ACE-I/ARB due to recent AKI.

## 2023-06-22 ENCOUNTER — Other Ambulatory Visit: Payer: Self-pay | Admitting: Internal Medicine

## 2023-06-22 MED ORDER — GLIMEPIRIDE 1 MG PO TABS
1.0000 mg | ORAL_TABLET | Freq: Every day | ORAL | 1 refills | Status: DC
Start: 2023-06-22 — End: 2023-12-04

## 2023-07-13 ENCOUNTER — Telehealth: Payer: Self-pay | Admitting: Internal Medicine

## 2023-07-13 NOTE — Telephone Encounter (Signed)
Drink plenty of fluids, keep legs elevated when sitting, move around often. We could try compression stockings if desired.

## 2023-07-13 NOTE — Telephone Encounter (Signed)
Patient's daughter said she has noticed swelling in mother's ankles. She thinks it is related to amLODipine (NORVASC) 10 MG tablet and wanted to know what Dr. Okey Dupre would like to do. [name] would like a call back at [phone].

## 2023-07-13 NOTE — Telephone Encounter (Signed)
Patient states that she is doing all 3 things advised by the doctor and will let us know if she would like to do the compressions stockings

## 2023-07-17 ENCOUNTER — Other Ambulatory Visit: Payer: Self-pay | Admitting: Family Medicine

## 2023-07-22 ENCOUNTER — Other Ambulatory Visit: Payer: Self-pay

## 2023-07-22 ENCOUNTER — Emergency Department (HOSPITAL_COMMUNITY)
Admission: EM | Admit: 2023-07-22 | Discharge: 2023-07-22 | Disposition: A | Discharge status: Home / Self Care | Payer: Medicare HMO | Attending: Emergency Medicine | Admitting: Emergency Medicine

## 2023-07-22 ENCOUNTER — Emergency Department (HOSPITAL_COMMUNITY): Payer: Medicare HMO

## 2023-07-22 DIAGNOSIS — Z7982 Long term (current) use of aspirin: Secondary | ICD-10-CM | POA: Diagnosis not present

## 2023-07-22 DIAGNOSIS — M62838 Other muscle spasm: Secondary | ICD-10-CM | POA: Diagnosis not present

## 2023-07-22 DIAGNOSIS — M79671 Pain in right foot: Secondary | ICD-10-CM | POA: Insufficient documentation

## 2023-07-22 DIAGNOSIS — Z794 Long term (current) use of insulin: Secondary | ICD-10-CM | POA: Diagnosis not present

## 2023-07-22 DIAGNOSIS — M19071 Primary osteoarthritis, right ankle and foot: Secondary | ICD-10-CM | POA: Diagnosis not present

## 2023-07-22 MED ORDER — ACETAMINOPHEN 325 MG PO TABS
650.0000 mg | ORAL_TABLET | Freq: Once | ORAL | Status: AC
  Administered 2023-07-22: 650 mg via ORAL
  Filled 2023-07-22: qty 2

## 2023-07-22 NOTE — ED Triage Notes (Signed)
Pt c/o pain in her right foot that radiates up to her hip. Denies any known injury.

## 2023-07-22 NOTE — ED Notes (Signed)
Discharge instructions provided by edp were discussed with pt and daughter/caregiver. Daughter was able to verbalize understanding with no additional questions at this time. Pt wheelchair to car. Personal walker at bedside returned to pt.

## 2023-07-22 NOTE — ED Provider Notes (Signed)
[place] EMERGENCY DEPARTMENT AT [facility] Provider Note   CSN: [id number] Arrival date & time: 07/22/23  4098     History  Chief Complaint  Patient presents with   Foot Pain    Kelly Castillo is a 89 y.o. female.  Patient presents to the emergency department for evaluation of right foot pain.  Patient reports that the foot swells sometimes.  The swelling comes and goes.  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  Today she was having a lot of pain in the foot around 7 PM.  She reports that she was not able to move the toes and the pain was present.  She reports that has now resolved.       Home Medications Prior to Admission medications   Medication Sig Start Date End Date Taking? Authorizing Provider  acetaminophen (TYLENOL) 325 MG tablet Take 650 mg by mouth as needed.    [provider]  amLODipine (NORVASC) 10 MG tablet Take 1 tablet (10 mg total) by mouth daily. 01/16/23   Gabriel Earing, FNP  aspirin 81 MG tablet Take 1 tablet (81 mg total) by mouth daily. 08/16/18   Lewayne Bunting, MD  BD PEN NEEDLE NANO 2ND GEN 32G X 4 MM MISC See admin instructions. 01/23/23   [provider]  blood glucose meter kit and supplies May test up to 4 times a day. E11.9 10/07/22   Daryll Drown, NP  Blood Glucose Monitoring Suppl DEVI Check BGs up to 4 times daily. May substitute to any manufacturer covered by patient's insurance. E11.65 01/27/23   Raliegh Ip, DO  Cholecalciferol (VITAMIN D3) 125 MCG (5000 UT) TABS Take 1 tablet by mouth daily.    [provider]  Continuous Blood Gluc Receiver (FREESTYLE LIBRE READER) DEVI 1 Units by Does not apply route daily. UAD to test BGs daily. Dx E11.65 01/27/23   Raliegh Ip, DO  Continuous Blood Gluc Sensor (FREESTYLE LIBRE 3 SENSOR) MISC Place 1 sensor on the skin every 14 days. Use to check glucose continuously. E11.65 01/27/23   Raliegh Ip, DO  ferrous sulfate 325 (65  FE) MG tablet TAKE 1 TABLET BY MOUTH EVERY DAY 06/01/23   Milian, Aleen Campi, FNP  glimepiride (AMARYL) 1 MG tablet Take 1 tablet (1 mg total) by mouth daily with breakfast. 06/22/23   Myrlene Broker, MD  glucose blood (ONETOUCH VERIO) test strip Check BS up to 4 times a day Dx E11.9 Patient not taking: Reported on 06/19/2023 02/25/23   Raliegh Ip, DO  insulin glargine (LANTUS SOLOSTAR) 100 UNIT/ML Solostar Pen Inject 6 Units into the skin at bedtime. 04/16/23   Milian, Aleen Campi, FNP  Insulin Pen Needle (PEN NEEDLES) 33G X 4 MM MISC 1 each by Does not apply route daily. 01/23/23   Bennie Pierini, FNP  Lancet Device MISC Check BGs 4 times daily. May substitute to any manufacturer covered by patient's insurance. E11.65 01/27/23   Delynn Flavin M, DO  Lancets (ONETOUCH DELICA PLUS [place]) MISC test up to 4 times a day. E11.9 04/17/23   Raliegh Ip, DO  Lancets Misc. MISC Check BGs 4 times daily. May substitute to any manufacturer covered by patient's insurance. E11.65 01/27/23   Raliegh Ip, DO  metoprolol succinate (TOPROL-XL) 25 MG 24 hr tablet Take 1 tablet (25 mg total) by mouth daily. 06/19/23   Myrlene Broker, MD  multivitamin (RENA-VIT) TABS tablet TAKE 1  TABLET BY MOUTH EVERYDAY AT BEDTIME 07/17/23   Gabriel Earing, FNP  rosuvastatin (CRESTOR) 20 MG tablet Take 1 tablet (20 mg total) by mouth daily. 01/16/23   Gabriel Earing, FNP      Allergies    Penicillins    Review of Systems   Review of Systems  Physical Exam Updated Vital Signs BP (!) 198/81   Pulse 78   Temp 98.4 F (36.9 C) (Oral)   Resp 19   Ht 4\' 10"  (1.473 m)   SpO2 100%   BMI 19.06 kg/m  Physical Exam Vitals and nursing note reviewed.  Constitutional:      General: She is not in acute distress.    Appearance: She is well-developed.  HENT:     Head: Normocephalic and atraumatic.     Mouth/Throat:     Mouth: Mucous membranes are moist.  Eyes:     General:  Vision grossly intact. Gaze aligned appropriately.     Extraocular Movements: Extraocular movements intact.     Conjunctiva/sclera: Conjunctivae normal.  Cardiovascular:     Rate and Rhythm: Normal rate and regular rhythm.     Pulses: Normal pulses.          Dorsalis pedis pulses are 2+ on the right side.     Heart sounds: Normal heart sounds, S1 normal and S2 normal. No murmur heard.    No friction rub. No gallop.  Pulmonary:     Effort: Pulmonary effort is normal. No respiratory distress.     Breath sounds: Normal breath sounds.  Abdominal:     General: Bowel sounds are normal.     Palpations: Abdomen is soft.     Tenderness: There is no abdominal tenderness. There is no guarding or rebound.     Hernia: No hernia is present.  Musculoskeletal:        General: No swelling.     Cervical back: Full passive range of motion without pain, normal range of motion and neck supple. No spinous process tenderness or muscular tenderness. Normal range of motion.     Right lower leg: No swelling or tenderness. No edema.     Left lower leg: No edema.  Skin:    General: Skin is warm and dry.     Capillary Refill: Capillary refill takes less than 2 seconds.     Findings: No ecchymosis, erythema, rash or wound.  Neurological:     General: No focal deficit present.     Mental Status: She is alert and oriented to person, place, and time.     GCS: GCS eye subscore is 4. GCS verbal subscore is 5. GCS motor subscore is 6.     Cranial Nerves: Cranial nerves 2-12 are intact.     Sensory: Sensation is intact.     Motor: Motor function is intact.     Coordination: Coordination is intact.  Psychiatric:        Attention and Perception: Attention normal.        Mood and Affect: Mood normal.        Speech: Speech normal.        Behavior: Behavior normal.     ED Results / Procedures / Treatments   Labs (all labs ordered are listed, but only abnormal results are displayed) Labs Reviewed - No data to  display  EKG None  Radiology DG Foot Complete Right  Result Date: 07/22/2023 CLINICAL DATA:  Right foot pain no known injury EXAM: RIGHT FOOT COMPLETE - 3+  VIEW COMPARISON:  None Available. FINDINGS: Postoperative changes in the distal tibia and fibula as well as the 1st metatarsal. Mild degenerative changes at the 1st MTP joint. No acute bony abnormality. Specifically, no fracture, subluxation, or dislocation. IMPRESSION: No acute bony abnormality. Electronically Signed   By: Charlett Nose M.D.   On: 07/22/2023 22:13    Procedures Procedures    Medications Ordered in ED Medications  acetaminophen (TYLENOL) tablet 650 mg (has no administration in time range)    ED Course/ Medical Decision Making/ A&P                                 Medical Decision Making Amount and/or Complexity of Data Reviewed Radiology: ordered and independent interpretation performed. Decision-making details documented in ED Course.   Differential diagnosis considered includes, but not limited to:  Orthopedic injury; arthritis; septic arthritis; ischemic limb; DVT; cellulitis; phlebitis  Patient has palpable dorsalis pedal pulse.  Normal capillary refill at the toes.  Normal overlying skin, no erythema, warmth or signs of infection.  There is no joint swelling, range of motion of all joints is normal.  No calf swelling or tenderness.  No concern for ischemic limb, DVT, septic joint.  Patient reports that when she was having the pain she could not move her toes due to the pain.  It sounds as though she had some type of spasm that has now resolved.  Patient without complaints currently, no further workup necessary.         Final Clinical Impression(s) / ED Diagnoses Final diagnoses:  Foot pain, right  Muscle spasm    Rx / DC Orders ED Discharge Orders     None         Gilda Crease, MD 07/22/23 2338

## 2023-07-22 NOTE — ED Notes (Signed)
Pt ambulated with no assistance back to room.

## 2023-08-11 ENCOUNTER — Telehealth: Payer: Self-pay

## 2023-08-11 NOTE — Telephone Encounter (Signed)
Transition Care Management Follow-up Telephone Call Date of discharge and from where: Kelly Castillo 9/4 How have you been since you were released from the hospital? Doing great. Pt had not followed up with PCP Any questions or concerns? No  Items Reviewed: Did the pt receive and understand the discharge instructions provided? Yes  Medications obtained and verified? No  Other? No  Any new allergies since your discharge? No  Dietary orders reviewed?  Do you have support at home? Yes    Follow up appointments reviewed:  PCP Hospital f/u appt confirmed? No  Scheduled to see  on  @ . Specialist Hospital f/u appt confirmed? No  Scheduled to see  on  @ . Are transportation arrangements needed? No  If their condition worsens, is the pt aware to call PCP or go to the Emergency Dept.? Yes Was the patient provided with contact information for the PCP's office or ED? Yes Was to pt encouraged to call back with questions or concerns? Yes

## 2023-08-26 ENCOUNTER — Other Ambulatory Visit: Payer: Self-pay | Admitting: Nurse Practitioner

## 2023-08-27 ENCOUNTER — Other Ambulatory Visit: Payer: Self-pay | Admitting: Family Medicine

## 2023-08-27 DIAGNOSIS — I10 Essential (primary) hypertension: Secondary | ICD-10-CM

## 2023-09-06 ENCOUNTER — Other Ambulatory Visit: Payer: Self-pay | Admitting: Family Medicine

## 2023-09-06 DIAGNOSIS — I10 Essential (primary) hypertension: Secondary | ICD-10-CM

## 2023-09-07 ENCOUNTER — Other Ambulatory Visit: Payer: Self-pay | Admitting: Internal Medicine

## 2023-09-07 DIAGNOSIS — I10 Essential (primary) hypertension: Secondary | ICD-10-CM

## 2023-09-21 ENCOUNTER — Ambulatory Visit: Payer: Medicare HMO | Admitting: Internal Medicine

## 2023-09-22 ENCOUNTER — Ambulatory Visit: Payer: Medicare HMO | Admitting: Internal Medicine

## 2023-09-22 ENCOUNTER — Encounter: Payer: Self-pay | Admitting: Internal Medicine

## 2023-09-22 VITALS — BP 174/68 | HR 71 | Temp 98.2°F | Ht <= 58 in | Wt 105.0 lb

## 2023-09-22 DIAGNOSIS — N184 Chronic kidney disease, stage 4 (severe): Secondary | ICD-10-CM | POA: Diagnosis not present

## 2023-09-22 DIAGNOSIS — Z794 Long term (current) use of insulin: Secondary | ICD-10-CM | POA: Diagnosis not present

## 2023-09-22 DIAGNOSIS — E118 Type 2 diabetes mellitus with unspecified complications: Secondary | ICD-10-CM

## 2023-09-22 DIAGNOSIS — I10 Essential (primary) hypertension: Secondary | ICD-10-CM

## 2023-09-22 LAB — POCT GLYCOSYLATED HEMOGLOBIN (HGB A1C): HbA1c POC (<> result, manual entry): 7.5 % (ref 4.0–5.6)

## 2023-09-22 MED ORDER — DEXCOM G7 SENSOR MISC: 11 refills | Status: AC

## 2023-09-22 MED ORDER — DEXCOM G7 RECEIVER DEVI: 0 refills | Status: DC

## 2023-09-22 NOTE — Progress Notes (Signed)
   Subjective:   Patient ID: Kelly Castillo, female    DOB: [birth year], 89 y.o.   MRN: [id number]  HPI The patient is an 89 YO female coming in for concerns about ongoing swelling in feet. Needs new sugar meter for continuous.   Review of Systems  Constitutional: Negative.   HENT: Negative.    Eyes: Negative.   Respiratory:  Negative for cough, chest tightness and shortness of breath.   Cardiovascular:  Positive for leg swelling. Negative for chest pain and palpitations.  Gastrointestinal:  Negative for abdominal distention, abdominal pain, constipation, diarrhea, nausea and vomiting.  Musculoskeletal: Negative.   Skin: Negative.   Neurological: Negative.   Psychiatric/Behavioral: Negative.      Objective:  Physical Exam Constitutional:      Appearance: She is well-developed.  HENT:     Head: Normocephalic and atraumatic.  Cardiovascular:     Rate and Rhythm: Normal rate and regular rhythm.  Pulmonary:     Effort: Pulmonary effort is normal. No respiratory distress.     Breath sounds: Normal breath sounds. No wheezing or rales.  Abdominal:     General: Bowel sounds are normal. There is no distension.     Palpations: Abdomen is soft.     Tenderness: There is no abdominal tenderness. There is no rebound.  Musculoskeletal:        General: Tenderness present.     Cervical back: Normal range of motion.  Skin:    General: Skin is warm and dry.  Neurological:     Mental Status: She is alert and oriented to person, place, and time.     Coordination: Coordination normal.     Vitals:   09/22/23 1357 09/22/23 1409  BP: (!) 174/68 (!) 174/68  Pulse: 71   Temp: 98.2 F (36.8 C)   TempSrc: Oral   SpO2: 100%   Weight: 105 lb (47.6 kg)   Height: 4\' 10"  (1.473 m)     Assessment & Plan:

## 2023-09-22 NOTE — Patient Instructions (Signed)
Your sugars are doing good so we will leave the medicines the same

## 2023-09-25 NOTE — Assessment & Plan Note (Signed)
Recent labs stable will recheck with next lab draw.

## 2023-09-25 NOTE — Assessment & Plan Note (Signed)
We did stop mealtime insulin at last visit and she has done okay. POC HgA1c still at goal today. Will keep amaryl 1 mg daily and lantus 6 units daily. On statin.

## 2023-09-25 NOTE — Assessment & Plan Note (Signed)
We discussed that her leg swelling could be coming from amlodipine but would like to work on elevating legs and drinking more fluids. BP at goal at home. Keep amlodipine 10 mg daily and metoprolol 25 mg daily.

## 2023-09-29 ENCOUNTER — Telehealth: Payer: Self-pay | Admitting: Internal Medicine

## 2023-09-29 ENCOUNTER — Other Ambulatory Visit (HOSPITAL_COMMUNITY): Payer: Self-pay

## 2023-09-29 NOTE — Telephone Encounter (Signed)
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   ( Continuous Glucose Receiver (DEXCOM G7 RECEIVER) DEVI)  [id number] [place] [place] - [address] [place], [place] [address] Phone: [phone]  Fax: [phone]    PLEASE ADVISE Thanks

## 2023-09-30 ENCOUNTER — Telehealth: Payer: Self-pay

## 2023-09-30 ENCOUNTER — Other Ambulatory Visit (HOSPITAL_COMMUNITY): Payer: Self-pay

## 2023-09-30 NOTE — Telephone Encounter (Signed)
Pharmacy Patient Advocate Encounter   Received notification from Pt Calls Messages that prior authorization for Dexcom G7 receiver is required/requested.   Insurance verification completed.   The patient is insured through [facility] .   Per test claim: PA required; PA submitted to above mentioned insurance via Fax Key/confirmation #/EOC -- Status is pending   Fax# [phone] Phone # [phone]

## 2023-10-08 ENCOUNTER — Other Ambulatory Visit (HOSPITAL_COMMUNITY): Payer: Self-pay

## 2023-10-08 NOTE — Telephone Encounter (Signed)
Pharmacy Patient Advocate Encounter  Received notification from [employer] that Prior Authorization for [facility] G7 receiver has been APPROVED from 10/01/23 to 11/17/23  Approval letter indexed to media tab

## 2023-10-20 NOTE — Telephone Encounter (Signed)
Please call patient back.  She wants to know if she can call the Dexcom 6 - [phone]

## 2023-11-17 DIAGNOSIS — E119 Type 2 diabetes mellitus without complications: Secondary | ICD-10-CM | POA: Diagnosis not present

## 2023-11-17 DIAGNOSIS — H02834 Dermatochalasis of left upper eyelid: Secondary | ICD-10-CM | POA: Diagnosis not present

## 2023-11-17 DIAGNOSIS — H52223 Regular astigmatism, bilateral: Secondary | ICD-10-CM | POA: Diagnosis not present

## 2023-11-17 DIAGNOSIS — L84 Corns and callosities: Secondary | ICD-10-CM | POA: Diagnosis not present

## 2023-11-17 DIAGNOSIS — M79676 Pain in unspecified toe(s): Secondary | ICD-10-CM | POA: Diagnosis not present

## 2023-11-17 DIAGNOSIS — H524 Presbyopia: Secondary | ICD-10-CM | POA: Diagnosis not present

## 2023-11-17 DIAGNOSIS — Z961 Presence of intraocular lens: Secondary | ICD-10-CM | POA: Diagnosis not present

## 2023-11-17 DIAGNOSIS — B351 Tinea unguium: Secondary | ICD-10-CM | POA: Diagnosis not present

## 2023-11-17 DIAGNOSIS — H02831 Dermatochalasis of right upper eyelid: Secondary | ICD-10-CM | POA: Diagnosis not present

## 2023-11-17 DIAGNOSIS — E1142 Type 2 diabetes mellitus with diabetic polyneuropathy: Secondary | ICD-10-CM | POA: Diagnosis not present

## 2023-11-17 LAB — HM DIABETES EYE EXAM

## 2023-11-19 ENCOUNTER — Telehealth: Payer: Self-pay

## 2023-11-19 ENCOUNTER — Telehealth: Payer: Self-pay | Admitting: Internal Medicine

## 2023-11-19 ENCOUNTER — Other Ambulatory Visit: Payer: Self-pay | Admitting: Family Medicine

## 2023-11-19 DIAGNOSIS — E1159 Type 2 diabetes mellitus with other circulatory complications: Secondary | ICD-10-CM

## 2023-11-19 NOTE — Telephone Encounter (Signed)
 Copied from CRM [id number]. Topic: Clinical - Prescription Issue >> Nov 19, 2023  2:56 PM Deaijah H wrote: Reason for CRM: Patients daughter called in stating she went to pharmacy to pick up prescription , but pharmacy stated patient does not have any refills. Would like to know if refill can be approved due to patient only having 3 pills left

## 2023-11-19 NOTE — Telephone Encounter (Signed)
 Copied from CRM [id number]. Topic: Clinical - Medication Refill >> Nov 19, 2023  2:59 PM Evie B wrote: Most Recent Primary Care Visit:  Provider: ROLLENE NORRIS A  Department: [facility]  Visit Type: OFFICE VISIT  Date: 09/22/2023  Medication: Amlodipine  10 mg  Has the patient contacted their pharmacy? Yes (Agent: If no, request that the patient contact the pharmacy for the refill. If patient does not wish to contact the pharmacy document the reason why and proceed with request.) (Agent: If yes, when and what did the pharmacy advise?)  Is this the correct pharmacy for this prescription? Yes If no, delete pharmacy and type the correct one.  This is the patient's preferred pharmacy:  [id number] [place] [place] [facility] [address] [place] [address] Phone: [phone] Fax: [phone]   Has the prescription been filled recently? Yes  Is the patient out of the medication? Yes  Has the patient been seen for an appointment in the last year OR does the patient have an upcoming appointment? Yes  Can we respond through MyChart? No  Agent: Please be advised that Rx refills may take up to 3 business days. We ask that you follow-up with your pharmacy.

## 2023-11-19 NOTE — Telephone Encounter (Signed)
Ok to refill for patient? ?

## 2023-11-20 ENCOUNTER — Other Ambulatory Visit: Payer: Self-pay

## 2023-11-20 DIAGNOSIS — I152 Hypertension secondary to endocrine disorders: Secondary | ICD-10-CM

## 2023-11-20 MED ORDER — AMLODIPINE BESYLATE 10 MG PO TABS: 10.0000 mg | ORAL_TABLET | Freq: Every day | ORAL | 3 refills | Status: DC

## 2023-11-20 NOTE — Telephone Encounter (Signed)
Ok to refill #90 3 refills ?

## 2023-11-20 NOTE — Telephone Encounter (Signed)
 This has been sent in

## 2023-11-20 NOTE — Telephone Encounter (Signed)
 Sent in the amlopdine today for patient

## 2023-12-04 ENCOUNTER — Other Ambulatory Visit: Payer: Self-pay | Admitting: Internal Medicine

## 2023-12-09 ENCOUNTER — Other Ambulatory Visit: Payer: Self-pay | Admitting: Internal Medicine

## 2023-12-09 DIAGNOSIS — I10 Essential (primary) hypertension: Secondary | ICD-10-CM

## 2023-12-10 ENCOUNTER — Telehealth: Payer: Self-pay | Admitting: Internal Medicine

## 2023-12-10 ENCOUNTER — Other Ambulatory Visit: Payer: Self-pay

## 2023-12-10 DIAGNOSIS — I10 Essential (primary) hypertension: Secondary | ICD-10-CM

## 2023-12-10 MED ORDER — FERROUS SULFATE 325 (65 FE) MG PO TABS: 325.0000 mg | ORAL_TABLET | Freq: Every day | ORAL | 0 refills | Status: DC

## 2023-12-10 NOTE — Telephone Encounter (Signed)
Copied from CRM [id number]. Topic: Clinical - Medication Refill >> Dec 10, 2023 12:54 PM Almira Coaster wrote: Most Recent Primary Care Visit:  Provider: Hillard Danker A  Department: [facility]  Visit Type: OFFICE VISIT  Date: 09/22/2023  Medication: ferrous sulfate 325 (65 FE) MG tablet  Has the patient contacted their pharmacy? Yes, they sent refill request to the office but have not gotten a response yet.  (Agent: If no, request that the patient contact the pharmacy for the refill. If patient does not wish to contact the pharmacy document the reason why and proceed with request.) (Agent: If yes, when and what did the pharmacy advise?)  Is this the correct pharmacy for this prescription? Yes If no, delete pharmacy and type the correct one.  This is the patient's preferred pharmacy:  [id number] [place]  - [address] [place] [address] Phone: [phone] Fax: [phone]   Has the prescription been filled recently? No  Is the patient out of the medication? Yes  Has the patient been seen for an appointment in the last year OR does the patient have an upcoming appointment? Yes  Can we respond through MyChart? No, prefers a phone call  Agent: Please be advised that Rx refills may take up to 3 business days. We ask that you follow-up with your pharmacy.

## 2023-12-10 NOTE — Telephone Encounter (Signed)
Done

## 2023-12-17 ENCOUNTER — Other Ambulatory Visit (HOSPITAL_COMMUNITY): Payer: Self-pay

## 2023-12-22 ENCOUNTER — Ambulatory Visit: Payer: Medicare HMO | Admitting: Internal Medicine

## 2023-12-22 ENCOUNTER — Ambulatory Visit: Payer: PPO | Admitting: Internal Medicine

## 2023-12-22 VITALS — BP 178/80 | HR 72 | Temp 98.2°F | Ht <= 58 in | Wt 111.0 lb

## 2023-12-22 DIAGNOSIS — E785 Hyperlipidemia, unspecified: Secondary | ICD-10-CM | POA: Diagnosis not present

## 2023-12-22 DIAGNOSIS — E1169 Type 2 diabetes mellitus with other specified complication: Secondary | ICD-10-CM | POA: Diagnosis not present

## 2023-12-22 DIAGNOSIS — E118 Type 2 diabetes mellitus with unspecified complications: Secondary | ICD-10-CM

## 2023-12-22 DIAGNOSIS — Z7984 Long term (current) use of oral hypoglycemic drugs: Secondary | ICD-10-CM

## 2023-12-22 DIAGNOSIS — D631 Anemia in chronic kidney disease: Secondary | ICD-10-CM

## 2023-12-22 DIAGNOSIS — N184 Chronic kidney disease, stage 4 (severe): Secondary | ICD-10-CM | POA: Diagnosis not present

## 2023-12-22 DIAGNOSIS — I10 Essential (primary) hypertension: Secondary | ICD-10-CM

## 2023-12-22 LAB — COMPREHENSIVE METABOLIC PANEL
ALT: 12 U/L (ref 0–35)
AST: 21 U/L (ref 0–37)
Albumin: 4.5 g/dL (ref 3.5–5.2)
Alkaline Phosphatase: 33 U/L — ABNORMAL LOW (ref 39–117)
BUN: 38 mg/dL — ABNORMAL HIGH (ref 6–23)
CO2: 25 meq/L (ref 19–32)
Calcium: 9.4 mg/dL (ref 8.4–10.5)
Chloride: 102 meq/L (ref 96–112)
Creatinine, Ser: 1.78 mg/dL — ABNORMAL HIGH (ref 0.40–1.20)
GFR: 25.27 mL/min — ABNORMAL LOW (ref >60.00)
Glucose, Bld: 192 mg/dL — ABNORMAL HIGH (ref 70–99)
Potassium: 4.5 meq/L (ref 3.5–5.1)
Sodium: 134 meq/L — ABNORMAL LOW (ref 135–145)
Total Bilirubin: 0.5 mg/dL (ref 0.2–1.2)
Total Protein: 7.4 g/dL (ref 6.0–8.3)

## 2023-12-22 LAB — CBC
HCT: 32.3 % — ABNORMAL LOW (ref 36.0–46.0)
Hemoglobin: 10.4 g/dL — ABNORMAL LOW (ref 12.0–15.0)
MCHC: 32.2 g/dL (ref 30.0–36.0)
MCV: 85.5 fL (ref 78.0–100.0)
Platelets: 241 10*3/uL (ref 150.0–400.0)
RBC: 3.78 Mil/uL — ABNORMAL LOW (ref 3.87–5.11)
RDW: 16.2 % — ABNORMAL HIGH (ref 11.5–15.5)
WBC: 7.5 10*3/uL (ref 4.0–10.5)

## 2023-12-22 LAB — LIPID PANEL
Cholesterol: 139 mg/dL (ref 0–200)
HDL: 71.6 mg/dL (ref >39.00)
LDL Cholesterol: 56 mg/dL (ref 0–99)
NonHDL: 67.25
Total CHOL/HDL Ratio: 2
Triglycerides: 55 mg/dL (ref 0.0–149.0)
VLDL: 11 mg/dL (ref 0.0–40.0)

## 2023-12-22 LAB — HEMOGLOBIN A1C: Hgb A1c MFr Bld: 7.6 % — ABNORMAL HIGH (ref 4.6–6.5)

## 2023-12-22 MED ORDER — DEXCOM G6 RECEIVER DEVI: 11 refills | Status: AC

## 2023-12-22 MED ORDER — DEXCOM G6 SENSOR MISC: 0 refills | Status: AC

## 2023-12-22 NOTE — Progress Notes (Signed)
   Subjective:   Patient ID: Kelly Castillo, female    DOB: Mar 31, 1934, 89 y.o.   MRN: [id number]  HPI The patient is an 89 YO female coming in for medical management. See A/P Wants to change from dexcom 7 to 6 as she heard the sensors last a month  Review of Systems  Constitutional: Negative.   HENT: Negative.    Eyes: Negative.   Respiratory:  Negative for cough, chest tightness and shortness of breath.   Cardiovascular:  Negative for chest pain, palpitations and leg swelling.  Gastrointestinal:  Negative for abdominal distention, abdominal pain, constipation, diarrhea, nausea and vomiting.  Musculoskeletal: Negative.   Skin: Negative.   Neurological: Negative.   Psychiatric/Behavioral: Negative.      Objective:  Physical Exam Constitutional:      Appearance: She is well-developed.  HENT:     Head: Normocephalic and atraumatic.  Cardiovascular:     Rate and Rhythm: Normal rate and regular rhythm.  Pulmonary:     Effort: Pulmonary effort is normal. No respiratory distress.     Breath sounds: Normal breath sounds. No wheezing or rales.  Abdominal:     General: Bowel sounds are normal. There is no distension.     Palpations: Abdomen is soft.     Tenderness: There is no abdominal tenderness. There is no rebound.  Musculoskeletal:     Cervical back: Normal range of motion.  Skin:    General: Skin is warm and dry.  Neurological:     Mental Status: She is alert and oriented to person, place, and time.     Coordination: Coordination normal.     Vitals:   12/22/23 0918  BP: (!) 178/80  Pulse: 72  Temp: 98.2 F (36.8 C)  TempSrc: Oral  SpO2: 98%  Weight: 111 lb (50.3 kg)  Height: 4' 10 (1.473 m)    Assessment & Plan:

## 2023-12-22 NOTE — Patient Instructions (Addendum)
9 of the 325 mg tylenol is safe

## 2023-12-23 ENCOUNTER — Encounter: Payer: Self-pay | Admitting: Internal Medicine

## 2023-12-24 ENCOUNTER — Telehealth: Payer: Self-pay

## 2023-12-24 ENCOUNTER — Encounter: Payer: Self-pay | Admitting: Internal Medicine

## 2023-12-24 NOTE — Assessment & Plan Note (Signed)
 BP high today and normal at home. Previous readings at office at goal. Taking metoprolol  25 mg daily and amlodipine  10 mg daily.

## 2023-12-24 NOTE — Assessment & Plan Note (Signed)
Checking lipid panel and adjust as needed crestor.

## 2023-12-24 NOTE — Assessment & Plan Note (Addendum)
 Checking HgA1c and CMP and adjust as needed. She is taking amaryl  1 mg daily. No low sugars. Rx dexcom 6 per patient and family preference.

## 2023-12-24 NOTE — Assessment & Plan Note (Signed)
 Checking CMP and adjust as needed.

## 2023-12-24 NOTE — Telephone Encounter (Signed)
 Pharmacy Patient Advocate Encounter   Received notification from Fax that prior authorization for Dexcom G6 Sensor is required/requested.   Insurance verification completed.   The patient is insured through [facility] ADVANTAGE/RX ADVANCE .   Per test claim: PA required; PA submitted to above mentioned insurance via CoverMyMeds Key/confirmation #/EOC [id number] Status is pending

## 2023-12-24 NOTE — Assessment & Plan Note (Signed)
 Checking CBC and adjust as needed.

## 2023-12-28 ENCOUNTER — Other Ambulatory Visit (HOSPITAL_COMMUNITY): Payer: Self-pay

## 2023-12-28 NOTE — Telephone Encounter (Signed)
 Pharmacy Patient Advocate Encounter  Received notification from [employer] that Prior Authorization for [facility] G6 SENSORS has been DENIED.  Full denial letter will be uploaded to the media tab. See denial reason below.   PA #/Case ID/Reference #: [id number] KEY# [id number]

## 2024-01-07 ENCOUNTER — Other Ambulatory Visit (HOSPITAL_COMMUNITY): Payer: Self-pay

## 2024-01-12 ENCOUNTER — Other Ambulatory Visit: Payer: Self-pay | Admitting: Internal Medicine

## 2024-01-12 ENCOUNTER — Other Ambulatory Visit: Payer: Self-pay | Admitting: Family Medicine

## 2024-01-12 DIAGNOSIS — I152 Hypertension secondary to endocrine disorders: Secondary | ICD-10-CM

## 2024-01-12 DIAGNOSIS — E1165 Type 2 diabetes mellitus with hyperglycemia: Secondary | ICD-10-CM

## 2024-01-12 DIAGNOSIS — N184 Chronic kidney disease, stage 4 (severe): Secondary | ICD-10-CM

## 2024-01-12 DIAGNOSIS — D631 Anemia in chronic kidney disease: Secondary | ICD-10-CM

## 2024-01-12 DIAGNOSIS — E1169 Type 2 diabetes mellitus with other specified complication: Secondary | ICD-10-CM

## 2024-01-13 ENCOUNTER — Telehealth: Payer: Self-pay

## 2024-01-13 ENCOUNTER — Other Ambulatory Visit (HOSPITAL_COMMUNITY): Payer: Self-pay

## 2024-01-13 NOTE — Telephone Encounter (Signed)
 Pharmacy Patient Advocate Encounter   Received notification from Pt Calls Messages that prior authorization for Dexcom G6 Sensor is required/requested.   Insurance verification completed.   The patient is insured through [facility] ADVANTAGE/RX ADVANCE .   Per test claim: PA required; PA submitted to above mentioned insurance via CoverMyMeds Key/confirmation #/EOC [id number] Status is pending

## 2024-01-13 NOTE — Telephone Encounter (Signed)
 Pharmacy Patient Advocate Encounter  Received notification from [facility] ADVANTAGE/RX ADVANCE that Prior Authorization for Dexcom G6 Sensor has been Denied due to a previously denied request on file.    Freestyle Libre 3 products are the preferred products on her plan. Can they get changed to that for the patient?

## 2024-01-13 NOTE — Telephone Encounter (Signed)
 Copied from CRM [id number]. Topic: Clinical - Prescription Issue >> Jan 12, 2024  5:11 PM Tiffany H wrote: Reason for CRM: Patient's daughter called to advise that patient needs a prior authorization for Continuous Glucose Sensor (DEXCOM G6).   [phone] [place] [place] - [address] [place], [place] [address] Phone: [phone]  Fax: [phone] DEA #: [id number] DAW Reason: --   Patient also needs a refill for multivitamin (RENA-VIT) TABS tablet [[id number]].   Patient will be changing all medications to online pharmacy. [facility] Please assist.

## 2024-01-19 ENCOUNTER — Other Ambulatory Visit (HOSPITAL_COMMUNITY): Payer: Self-pay

## 2024-01-19 NOTE — Telephone Encounter (Signed)
 Dexcom was changed to [employer].   Per test claim - refill too soon next fill 02/03/24

## 2024-01-21 ENCOUNTER — Other Ambulatory Visit: Payer: Self-pay | Admitting: Family Medicine

## 2024-01-25 ENCOUNTER — Other Ambulatory Visit (HOSPITAL_COMMUNITY): Payer: Self-pay

## 2024-02-09 DIAGNOSIS — E113291 Type 2 diabetes mellitus with mild nonproliferative diabetic retinopathy without macular edema, right eye: Secondary | ICD-10-CM | POA: Diagnosis not present

## 2024-02-09 DIAGNOSIS — E113212 Type 2 diabetes mellitus with mild nonproliferative diabetic retinopathy with macular edema, left eye: Secondary | ICD-10-CM | POA: Diagnosis not present

## 2024-02-09 DIAGNOSIS — Z961 Presence of intraocular lens: Secondary | ICD-10-CM | POA: Diagnosis not present

## 2024-02-09 DIAGNOSIS — H43813 Vitreous degeneration, bilateral: Secondary | ICD-10-CM | POA: Diagnosis not present

## 2024-02-12 ENCOUNTER — Other Ambulatory Visit: Payer: Self-pay | Admitting: Family Medicine

## 2024-02-16 ENCOUNTER — Other Ambulatory Visit: Payer: Self-pay | Admitting: Internal Medicine

## 2024-02-16 MED ORDER — RENA-VITE PO TABS: 1.0000 | ORAL_TABLET | Freq: Every day | ORAL | 1 refills | Status: DC

## 2024-02-16 NOTE — Telephone Encounter (Signed)
 Copied from CRM [id number]. Topic: Clinical - Medication Refill >> Feb 16, 2024  8:19 AM Elizebeth Brooking wrote: Most Recent Primary Care Visit:  Provider: Hillard Danker A  Department: [facility]  Visit Type: OFFICE VISIT  Date: 12/22/2023  Medication: multivitamin (RENA-VIT) TABS tablet  Has the patient contacted their pharmacy? Yes (Agent: If no, request that the patient contact the pharmacy for the refill. If patient does not wish to contact the pharmacy document the reason why and proceed with request.) (Agent: If yes, when and what did the pharmacy advise?)  Is this the correct pharmacy for this prescription? Yes If no, delete pharmacy and type the correct one.  This is the patient's preferred pharmacy:  [id number] [place] [place] - [address] [place] [address] Phone: [phone] Fax: [phone]   Has the prescription been filled recently? No  Is the patient out of the medication? Yes  Has the patient been seen for an appointment in the last year OR does the patient have an upcoming appointment? Yes  Can we respond through MyChart? Yes  Agent: Please be advised that Rx refills may take up to 3 business days. We ask that you follow-up with your pharmacy.

## 2024-03-03 ENCOUNTER — Other Ambulatory Visit: Payer: Self-pay | Admitting: Internal Medicine

## 2024-03-13 ENCOUNTER — Other Ambulatory Visit: Payer: Self-pay | Admitting: Family Medicine

## 2024-03-13 DIAGNOSIS — E1169 Type 2 diabetes mellitus with other specified complication: Secondary | ICD-10-CM

## 2024-03-14 ENCOUNTER — Other Ambulatory Visit: Payer: Self-pay | Admitting: Internal Medicine

## 2024-03-14 ENCOUNTER — Telehealth: Payer: Self-pay | Admitting: Internal Medicine

## 2024-03-14 DIAGNOSIS — E1169 Type 2 diabetes mellitus with other specified complication: Secondary | ICD-10-CM

## 2024-03-14 NOTE — Telephone Encounter (Signed)
 Copied from CRM [id number]. Topic: Clinical - Medication Question >> Mar 14, 2024  2:43 PM Kelly Castillo wrote: Reason for CRM: Patients daughter would like to know if Dr Nicolette Barrio could represcribe rosuvastatin  20 mg for her mom.The request was denied by provider due to it originally being prescribed by a provider she no longer sees.Daughter would like to know if she needs an office visit first?

## 2024-03-14 NOTE — Telephone Encounter (Signed)
 Copied from CRM [id number]. Topic: Clinical - Medication Refill >> Mar 14, 2024  9:36 AM Orien Bird wrote: Most Recent Primary Care Visit:  Provider: Bambi Lever A  Department: [facility]  Visit Type: OFFICE VISIT  Date: 12/22/2023  Medication: rosuvastatin  (CRESTOR ) 20 MG tablet  Has the patient contacted their pharmacy? Yes (Agent: If no, request that the patient contact the pharmacy for the refill. If patient does not wish to contact the pharmacy document the reason why and proceed with request.) (Agent: If yes, when and what did the pharmacy advise?)  Is this the correct pharmacy for this prescription? Yes If no, delete pharmacy and type the correct one.  This is the patient's preferred pharmacy:  [id number] [place] [place] - [address] [place] [address] Phone: [phone] Fax: [phone]   Has the prescription been filled recently? No  Is the patient out of the medication? Yes  Has the patient been seen for an appointment in the last year OR does the patient have an upcoming appointment? Yes  Can we respond through MyChart? No  Agent: Please be advised that Rx refills may take up to 3 business days. We ask that you follow-up with your pharmacy.

## 2024-03-15 NOTE — Telephone Encounter (Signed)
Schedule a visit

## 2024-03-17 DIAGNOSIS — E1142 Type 2 diabetes mellitus with diabetic polyneuropathy: Secondary | ICD-10-CM | POA: Diagnosis not present

## 2024-03-17 DIAGNOSIS — M79675 Pain in left toe(s): Secondary | ICD-10-CM | POA: Diagnosis not present

## 2024-03-17 DIAGNOSIS — M79674 Pain in right toe(s): Secondary | ICD-10-CM | POA: Diagnosis not present

## 2024-03-17 DIAGNOSIS — B351 Tinea unguium: Secondary | ICD-10-CM | POA: Diagnosis not present

## 2024-03-17 DIAGNOSIS — L84 Corns and callosities: Secondary | ICD-10-CM | POA: Diagnosis not present

## 2024-03-18 NOTE — Telephone Encounter (Signed)
 I have already informed patient daughter in regards to this a appointment has been scheduled.

## 2024-03-22 ENCOUNTER — Encounter: Payer: Self-pay | Admitting: Internal Medicine

## 2024-03-22 ENCOUNTER — Other Ambulatory Visit (HOSPITAL_COMMUNITY): Payer: Self-pay

## 2024-03-22 ENCOUNTER — Ambulatory Visit (INDEPENDENT_AMBULATORY_CARE_PROVIDER_SITE_OTHER): Payer: PPO | Admitting: Internal Medicine

## 2024-03-22 VITALS — BP 140/60 | HR 70 | Temp 98.0°F | Ht <= 58 in | Wt 117.0 lb

## 2024-03-22 DIAGNOSIS — E785 Hyperlipidemia, unspecified: Secondary | ICD-10-CM

## 2024-03-22 DIAGNOSIS — E118 Type 2 diabetes mellitus with unspecified complications: Secondary | ICD-10-CM

## 2024-03-22 DIAGNOSIS — E1169 Type 2 diabetes mellitus with other specified complication: Secondary | ICD-10-CM

## 2024-03-22 DIAGNOSIS — Z7984 Long term (current) use of oral hypoglycemic drugs: Secondary | ICD-10-CM | POA: Diagnosis not present

## 2024-03-22 LAB — POCT GLYCOSYLATED HEMOGLOBIN (HGB A1C): HbA1c POC (<> result, manual entry): 7.2 % (ref 4.0–5.6)

## 2024-03-22 MED ORDER — RENA-VITE PO TABS: 1.0000 | ORAL_TABLET | Freq: Every day | ORAL | 3 refills | Status: AC

## 2024-03-22 MED ORDER — ROSUVASTATIN CALCIUM 20 MG PO TABS
20.0000 mg | ORAL_TABLET | Freq: Every day | ORAL | 3 refills | Status: AC
Start: 2024-03-22 — End: ?

## 2024-03-22 NOTE — Assessment & Plan Note (Signed)
 Needs refill crestor  done today. Will keep same dosing lipid panel up to date.

## 2024-03-22 NOTE — Assessment & Plan Note (Signed)
 POC HgA1c done and 7.2% today which is improved and still at goal. No low sugars. Continue amaryl  1 mg daily. Follow up 6 months

## 2024-03-22 NOTE — Progress Notes (Signed)
   Subjective:   Patient ID: Kelly Castillo, female    DOB: 1933/12/22, 89 y.o.   MRN: [id number]  HPI The patient is an 89 YO female coming in for follow up. Sugars good no lows. No new concerns.   Review of Systems  Constitutional: Negative.   HENT: Negative.    Eyes: Negative.   Respiratory:  Negative for cough, chest tightness and shortness of breath.   Cardiovascular:  Negative for chest pain, palpitations and leg swelling.  Gastrointestinal:  Negative for abdominal distention, abdominal pain, constipation, diarrhea, nausea and vomiting.  Musculoskeletal: Negative.   Skin: Negative.   Neurological: Negative.   Psychiatric/Behavioral: Negative.      Objective:  Physical Exam Constitutional:      Appearance: She is well-developed.  HENT:     Head: Normocephalic and atraumatic.  Cardiovascular:     Rate and Rhythm: Normal rate and regular rhythm.  Pulmonary:     Effort: Pulmonary effort is normal. No respiratory distress.     Breath sounds: Normal breath sounds. No wheezing or rales.  Abdominal:     General: Bowel sounds are normal. There is no distension.     Palpations: Abdomen is soft.     Tenderness: There is no abdominal tenderness. There is no rebound.  Musculoskeletal:     Cervical back: Normal range of motion.  Skin:    General: Skin is warm and dry.  Neurological:     Mental Status: She is alert and oriented to person, place, and time.     Coordination: Coordination abnormal.     Comments: Walker with seat     Vitals:   03/22/24 0901  BP: (!) 140/60  Pulse: 70  Temp: 98 F (36.7 C)  TempSrc: Oral  SpO2: 96%  Weight: 117 lb (53.1 kg)  Height: 4\' 10"  (1.473 m)    Assessment & Plan:

## 2024-05-24 ENCOUNTER — Ambulatory Visit (INDEPENDENT_AMBULATORY_CARE_PROVIDER_SITE_OTHER)

## 2024-05-24 VITALS — Ht <= 58 in | Wt 117.0 lb

## 2024-05-24 DIAGNOSIS — Z0000 Encounter for general adult medical examination without abnormal findings: Secondary | ICD-10-CM | POA: Diagnosis not present

## 2024-05-24 DIAGNOSIS — M859 Disorder of bone density and structure, unspecified: Secondary | ICD-10-CM

## 2024-05-24 NOTE — Patient Instructions (Signed)
 Kelly Castillo , Thank you for taking time out of your busy schedule to complete your Annual Wellness Visit with me. I enjoyed our conversation and look forward to speaking with you again next year. I, as well as your care team,  appreciate your ongoing commitment to your health goals. Please review the following plan we discussed and let me know if I can assist you in the future. Your Game plan/ To Do List    Referrals: If you haven't heard from the office you've been referred to, please reach out to them at the phone provided.  You have an order for:  [x]   Bone Density     Please call for appointment:  [facility]  Address: [address] [place], [place] [address] Phone: [phone]  Make sure to wear two-piece clothing.  No lotions, powders, or deodorants the day of the appointment. Make sure to bring picture ID and insurance card.  Bring list of medications you are currently taking including any supplements.   Follow up Visits: Next Medicare AWV with our clinical staff: 05/26/2025.  Patient prefers a telephone visit.   Have you seen your provider in the last 6 months (3 months if uncontrolled diabetes)? Yes Next Office Visit with your provider: 09/20/2024.  Clinician Recommendations:  Aim for 30 minutes of exercise or brisk walking, 6-8 glasses of water , and 5 servings of fruits and vegetables each day. Keep up the good work.      This is a list of the screening recommended for you and due dates:  Health Maintenance  Topic Date Due   DTaP/Tdap/Td vaccine (1 - Tdap) Never done   Pneumococcal Vaccine for age over 72 (1 of 2 - PCV) Never done   Zoster (Shingles) Vaccine (1 of 2) Never done   DEXA scan (bone density measurement)  01/16/2023   COVID-19 Vaccine (6 - 2024-25 season) 07/19/2023   Flu Shot  06/17/2024   Hemoglobin A1C  09/22/2024   Eye exam for diabetics  11/16/2024   Complete foot exam   12/21/2024   Medicare Annual Wellness Visit   05/24/2025   Hepatitis B Vaccine  Aged Out   HPV Vaccine  Aged Out   Meningitis B Vaccine  Aged Out    Advanced directives: (In Chart) A copy of your advanced directives are scanned into your chart should your provider ever need it. Advance Care Planning is important because it:  [x]  Makes sure you receive the medical care that is consistent with your values, goals, and preferences  [x]  It provides guidance to your family and loved ones and reduces their decisional burden about whether or not they are making the right decisions based on your wishes.  Follow the link provided in your after visit summary or read over the paperwork we have mailed to you to help you started getting your Advance Directives in place. If you need assistance in completing these, please reach out to us  so that we can help you!  See attachments for Preventive Care and Fall Prevention Tips.

## 2024-05-24 NOTE — Progress Notes (Signed)
 Subjective:   Kelly Castillo is a 89 y.o. who presents for a Medicare Wellness preventive visit.  As a reminder, Annual Wellness Visits don't include a physical exam, and some assessments may be limited, especially if this visit is performed virtually. We may recommend an in-person follow-up visit with your provider if needed.  Visit Complete: Virtual I connected with  Kelly Castillo on 05/24/24 by a audio enabled telemedicine application and verified that I am speaking with the correct person using two identifiers.  Patient Location: Home  Provider Location: Home Office  I discussed the limitations of evaluation and management by telemedicine. The patient expressed understanding and agreed to proceed.  Vital Signs: Because this visit was a virtual/telehealth visit, some criteria may be missing or patient reported. Any vitals not documented were not able to be obtained and vitals that have been documented are patient reported.  VideoDeclined- This patient declined Librarian, academic. Therefore the visit was completed with audio only.  Persons Participating in Visit: Patient assisted by her daughter.  AWV Questionnaire: No: Patient Medicare AWV questionnaire was not completed prior to this visit.  Cardiac Risk Factors include: advanced age (>65men, >80 women)     Objective:    Today's Vitals   05/24/24 1404  Weight: 117 lb (53.1 kg)  Height: 4' 10 (1.473 m)   Body mass index is 24.45 kg/m.     05/24/2024    2:17 PM 07/22/2023    7:35 PM 02/19/2023    6:33 PM 01/02/2023    7:56 PM 01/02/2023    2:29 PM 09/23/2022    3:48 PM 07/19/2021    1:28 PM  Advanced Directives  Does Patient Have a Medical Advance Directive? Yes No No No No Yes Yes  Type of Estate agent of [place];Living will     Healthcare Power of [place];Living will   Does patient want to make changes to medical advance directive? No - Patient declined     No -  Patient declined   Copy of Healthcare Power of Attorney in Chart? Yes - validated most recent copy scanned in chart (See row information)     Yes - validated most recent copy scanned in chart (See row information)   Would patient like information on creating a medical advance directive?   No - Patient declined No - Patient declined       Current Medications (verified) Outpatient Encounter Medications as of 05/24/2024  Medication Sig   acetaminophen  (TYLENOL ) 325 MG tablet Take 650 mg by mouth as needed.   amLODipine  (NORVASC ) 10 MG tablet Take 1 tablet (10 mg total) by mouth daily.   aspirin  81 MG tablet Take 1 tablet (81 mg total) by mouth daily.   blood glucose meter kit and supplies May test up to 4 times a day. E11.9   Blood Glucose Monitoring Suppl DEVI Check BGs up to 4 times daily. May substitute to any manufacturer covered by patient's insurance. E11.65   Cholecalciferol (VITAMIN D3) 125 MCG (5000 UT) TABS Take 1 tablet by mouth daily.   Continuous Glucose Receiver (DEXCOM G6 RECEIVER) DEVI Use to monitor sugars   Continuous Glucose Sensor (DEXCOM G6 SENSOR) MISC Use monitor sugars   Continuous Glucose Sensor (DEXCOM G7 SENSOR) MISC Change every 10 days   Continuous Glucose Sensor (FREESTYLE LIBRE 3 SENSOR) MISC PLACE 1 SENSOR ON THE SKIN EVERY 14 DAYS. USE TO CHECK GLUCOSE CONTINUOUSLY. E11.65   ferrous sulfate  325 (65 FE) MG tablet  Take 1 tablet (325 mg total) by mouth daily.   glimepiride  (AMARYL ) 1 MG tablet TAKE 1 TABLET BY MOUTH EVERY DAY WITH BREAKFAST   glucose blood (ONETOUCH VERIO) test strip Check BS up to 4 times a day Dx E11.9   Lancet Device MISC Check BGs 4 times daily. May substitute to any manufacturer covered by patient's insurance. E11.65   Lancets (ONETOUCH DELICA PLUS LANCET33G) MISC test up to 4 times a day. E11.9   Lancets Misc. MISC Check BGs 4 times daily. May substitute to any manufacturer covered by patient's insurance. E11.65   metoprolol  succinate  (TOPROL -XL) 25 MG 24 hr tablet Take 1 tablet (25 mg total) by mouth daily.   multivitamin (RENA-VIT) TABS tablet Take 1 tablet by mouth daily.   rosuvastatin  (CRESTOR ) 20 MG tablet Take 1 tablet (20 mg total) by mouth daily.   No facility-administered encounter medications on file as of 05/24/2024.    Allergies (verified) Penicillins   History: Past Medical History:  Diagnosis Date   Anemia    GERD (gastroesophageal reflux disease)    HTN (hypertension)    Hypercholesterolemia    LBBB (left bundle branch block)    Lumbar disc disease    Non-ischemic cardiomyopathy (HCC)    Poor short term memory    Type II diabetes mellitus (HCC)    Past Surgical History:  Procedure Laterality Date   APPENDECTOMY  1970's   [place]   CARDIAC CATHETERIZATION N/A 08/08/2015   Procedure: Left Heart Cath and Coronary Angiography;  Surgeon: Victory LELON Sharps, MD;  Location[facility] INVASIVE CV LAB;  Service: Cardiovascular;  Laterality: N/A;   CARDIAC CATHETERIZATION  01/31/2014   CATARACT EXTRACTION W/ INTRAOCULAR LENS IMPLANT Right    CATARACT EXTRACTION W/PHACO  07/24/2011   Procedure: CATARACT EXTRACTION PHACO AND INTRAOCULAR LENS PLACEMENT (IOC);  Surgeon: Cherene Mania;  Location: [facility] ORS;  Service: Ophthalmology;  Laterality: Left;  CDE: 14.73   CORONARY ARTERY BYPASS GRAFT N/A 08/10/2015   Procedure: OFF PUMP CORONARY ARTERY BYPASS GRAFTING (CABG) UTILIZING THE LEFT INTERNAL MAMMARY ARTERY;  Surgeon: Elspeth JAYSON Millers, MD;  Location: [facility] OR;  Service: Open Heart Surgery;  Laterality: N/A;   DILATION AND CURETTAGE OF UTERUS     EYE SURGERY Bilateral    laser; related to diabetes   FRACTURE SURGERY     LEFT HEART CATHETERIZATION WITH CORONARY ANGIOGRAM N/A 01/31/2014   Procedure: LEFT HEART CATHETERIZATION WITH CORONARY ANGIOGRAM;  Surgeon: Peter M Swaz[place]and, MD;  Location: [facility] CATH LAB;  Service: Cardiovascular;  Laterality: N/A;   ORIF ANKLE FRACTURE Right 1980's   TEE WITHOUT CARDIOVERSION N/A 08/10/2015    Procedure: TRANSESOPHAGEAL ECHOCARDIOGRAM (TEE);  Surgeon: Elspeth JAYSON Millers, MD;  Location: [facility] OR;  Service: Open Heart Surgery;  Laterality: N/A;   TUBAL LIGATION     VAGINAL HYSTERECTOMY  1980's   Family History  Problem Relation Age of Onset   Anesthesia problems Neg Hx    Hypotension Neg Hx    Malignant hyperthermia Neg Hx    Pseudochol deficiency Neg Hx    Social History   Socioeconomic History   Marital status: Widowed    Spouse name: Not on file   Number of children: 10   Years of education: Not on file   Highest education level: Not on file  Occupational History   Occupation: Retired    Comment: Engineer, structural for [employer].  Tobacco Use   Smoking status: Never   Smokeless tobacco: Never  Vaping Use   Vaping status:  Never Used  Substance and Sexual Activity   Alcohol  use: No    Alcohol /week: 0.0 standard drinks of alcohol    Drug use: No   Sexual activity: Yes    Birth control/protection: Surgical  Other Topics Concern   Not on file  Social History Narrative   9 children living and 1 deceased.   Lives with youngest Son. Daughter, Lonell, comes during the day to help patient with ADLs and stays until her brother gets home.    Social Drivers of Corporate investment banker Strain: Low Risk  (05/24/2024)   Overall Financial Resource Strain (CARDIA)    Difficulty of Paying Living Expenses: Not hard at all  Food Insecurity: No Food Insecurity (05/24/2024)   Hunger Vital Sign    Worried About Running Out of Food in the Last Year: Never true    Ran Out of Food in the Last Year: Never true  Transportation Needs: No Transportation Needs (05/24/2024)   PRAPARE - Administrator, Civil Service (Medical): No    Lack of Transportation (Non-Medical): No  Physical Activity: Inactive (05/24/2024)   Exercise Vital Sign    Days of Exercise per Week: 0 days    Minutes of Exercise per Session: 0 min  Stress: No Stress Concern Present (05/24/2024)    [employer] of Occupational Health - Occupational Stress Questionnaire    Feeling of Stress: Not at all  Social Connections: Socially Isolated (05/24/2024)   Social Connection and Isolation Panel    Frequency of Communication with Friends and Family: More than three times a week    Frequency of Social Gatherings with Friends and Family: More than three times a week    Attends Religious Services: Never    Database administrator or Organizations: No    Attends Banker Meetings: Never    Marital Status: Widowed    Tobacco Counseling Counseling given: Not Answered    Clinical Intake:  Pre-visit preparation completed: Yes  Pain : No/denies pain     BMI - recorded: 24.45 Nutritional Status: BMI of 19-24  Normal Nutritional Risks: None Diabetes: Yes CBG done?: Yes CBG resulted in Enter/ Edit results?: No Did pt. bring in CBG monitor from home?: No  Lab Results  Component Value Date   HGBA1C 7.2 03/22/2024   HGBA1C 7.6 (H) 12/22/2023   HGBA1C 7.5 09/22/2023     How often do you need to have someone help you when you read instructions, pamphlets, or other written materials from your doctor or pharmacy?: 1 - Never  Interpreter Needed?: No  Information entered by :: Anavey Coombes, RMA   Activities of Daily Living     05/24/2024    2:15 PM  In your present state of health, do you have any difficulty performing the following activities:  Hearing? 0  Vision? 0  Difficulty concentrating or making decisions? 0  Walking or climbing stairs? 0  Dressing or bathing? 0  Doing errands, shopping? 0  Comment children drives her around  Preparing Food and eating ? N  Using the Toilet? N  In the past six months, have you accidently leaked urine? N  Do you have problems with loss of bowel control? N  Managing your Medications? N  Managing your Finances? N  Housekeeping or managing your Housekeeping? N    Patient Care Team: Rollene Almarie LABOR, MD as PCP -  General (Internal Medicine) Pietro Redell RAMAN, MD as PCP - Cardiology (Cardiology) Pietro Redell RAMAN, MD as  Consulting Physician (Cardiology) Rayburn Pac, MD as Consulting Physician (Nephrology)  I have updated your Care Teams any recent Medical Services you may have received from other providers in the past year.     Assessment:   This is a routine wellness examination for [facility].  Hearing/Vision screen No results found.   Goals Addressed   None    Depression Screen     05/24/2024    2:21 PM 03/22/2024    9:09 AM 02/24/2023    9:17 AM 01/27/2023   10:57 AM 01/16/2023   11:05 AM 10/07/2022   10:24 AM 09/23/2022    3:46 PM  PHQ 2/9 Scores  PHQ - 2 Score 0 0 0 0  0 0  PHQ- 9 Score 0 0 0 0  0   Exception Documentation     Patient refusal      Fall Risk     05/24/2024    2:17 PM 03/22/2024    9:09 AM 09/22/2023    2:10 PM 06/19/2023   10:43 AM 02/24/2023    9:17 AM  Fall Risk   Falls in the past year? 0 0 0 0   Number falls in past yr: 0 0 0 0 0  Injury with Fall? 0 0 0 0 1  Risk for fall due to : Impaired balance/gait    No Fall Risks  Follow up Falls evaluation completed;Falls prevention discussed Falls evaluation completed Falls evaluation completed Falls evaluation completed Falls evaluation completed    MEDICARE RISK AT HOME:  Medicare Risk at Home Any stairs in or around the home?: No If so, are there any without handrails?: No Home free of loose throw rugs in walkways, pet beds, electrical cords, etc?: Yes Adequate lighting in your home to reduce risk of falls?: Yes Life alert?: No Use of a cane, walker or w/c?: Yes (cane) Grab bars in the bathroom?: Yes Shower chair or bench in shower?: Yes Elevated toilet seat or a handicapped toilet?: Yes  TIMED UP AND GO:  Was the test performed?  No  Cognitive Function: 6CIT completed        05/24/2024    2:18 PM 09/23/2022    3:48 PM 07/19/2021    1:31 PM  6CIT Screen  What Year? 0 points 0 points 4 points  What  month? 0 points 0 points 0 points  What time? 0 points 0 points 0 points  Count back from 20 0 points 0 points 0 points  Months in reverse 0 points 2 points 0 points  Repeat phrase 0 points 2 points 8 points  Total Score 0 points 4 points 12 points    Immunizations Immunization History  Administered Date(s) Administered   Moderna Covid Bivalent Peds Booster(69mo Thru 79yrs) 09/19/2021   Moderna SARS-COV2 Booster Vaccination 09/30/2022   Moderna Sars-Covid-2 Vaccination 04/19/2020, 05/17/2020, 12/14/2020    Screening Tests Health Maintenance  Topic Date Due   DTaP/Tdap/Td (1 - Tdap) Never done   Pneumococcal Vaccine: 50+ Years (1 of 2 - PCV) Never done   Zoster Vaccines- Shingrix (1 of 2) Never done   DEXA SCAN  01/16/2023   COVID-19 Vaccine (6 - 2024-25 season) 07/19/2023   Medicare Annual Wellness (AWV)  09/24/2023   INFLUENZA VACCINE  06/17/2024   HEMOGLOBIN A1C  09/22/2024   OPHTHALMOLOGY EXAM  11/16/2024   FOOT EXAM  12/21/2024   Hepatitis B Vaccines  Aged Out   HPV VACCINES  Aged Out   Meningococcal B Vaccine  Aged Out  Health Maintenance  Health Maintenance Due  Topic Date Due   DTaP/Tdap/Td (1 - Tdap) Never done   Pneumococcal Vaccine: 50+ Years (1 of 2 - PCV) Never done   Zoster Vaccines- Shingrix (1 of 2) Never done   DEXA SCAN  01/16/2023   COVID-19 Vaccine (6 - 2024-25 season) 07/19/2023   Medicare Annual Wellness (AWV)  09/24/2023   Health Maintenance Items Addressed: DEXA ordered  Additional Screening:  Vision Screening: Recommended annual ophthalmology exams for early detection of glaucoma and other disorders of the eye. Would you like a referral to an eye doctor? No    Dental Screening: Recommended annual dental exams for proper oral hygiene  Community Resource Referral / Chronic Care Management: CRR required this visit?  No   CCM required this visit?  No   Plan:    I have personally reviewed and noted the following in the patient's  chart:   Medical and social history Use of alcohol , tobacco or illicit drugs  Current medications and supplements including opioid prescriptions. Patient is not currently taking opioid prescriptions. Functional ability and status Nutritional status Physical activity Advanced directives List of other physicians Hospitalizations, surgeries, and ER visits in previous 12 months Vitals Screenings to include cognitive, depression, and falls Referrals and appointments  In addition, I have reviewed and discussed with patient certain preventive protocols, quality metrics, and best practice recommendations. A written personalized care plan for preventive services as well as general preventive health recommendations were provided to patient.   Sahaana Weitman L Golden Gilreath, CMA   05/24/2024   After Visit Summary: (MyChart) Due to this being a telephonic visit, the after visit summary with patients personalized plan was offered to patient via MyChart   Notes: Patient declines all vaccines at this time.  She id due for a DEXA and it has been scheduled.  She had no other concerns to address today.

## 2024-06-02 ENCOUNTER — Other Ambulatory Visit: Payer: Self-pay | Admitting: Internal Medicine

## 2024-06-03 ENCOUNTER — Other Ambulatory Visit: Payer: Self-pay | Admitting: Internal Medicine

## 2024-06-03 DIAGNOSIS — I10 Essential (primary) hypertension: Secondary | ICD-10-CM

## 2024-06-14 ENCOUNTER — Telehealth: Payer: Self-pay | Admitting: Internal Medicine

## 2024-06-14 NOTE — Telephone Encounter (Signed)
**Note De-identified  [place] Obfuscation** Please advise 

## 2024-06-14 NOTE — Telephone Encounter (Signed)
 Patient was referred to [facility] for her bone density exam, but was told by that office that they cannot see her since she is not a patient with their office. Patient's daughter, Lonell, would like the referral placed elsewhere to get it done. Best callback is [phone].

## 2024-06-15 NOTE — Addendum Note (Signed)
 Addended by: Saranne Crislip L on: 06/15/2024 10:39 AM   Modules accepted: Orders

## 2024-07-26 DIAGNOSIS — H43813 Vitreous degeneration, bilateral: Secondary | ICD-10-CM | POA: Diagnosis not present

## 2024-07-26 DIAGNOSIS — Z961 Presence of intraocular lens: Secondary | ICD-10-CM | POA: Diagnosis not present

## 2024-07-26 DIAGNOSIS — E113312 Type 2 diabetes mellitus with moderate nonproliferative diabetic retinopathy with macular edema, left eye: Secondary | ICD-10-CM | POA: Diagnosis not present

## 2024-07-26 DIAGNOSIS — E113391 Type 2 diabetes mellitus with moderate nonproliferative diabetic retinopathy without macular edema, right eye: Secondary | ICD-10-CM | POA: Diagnosis not present

## 2024-08-09 ENCOUNTER — Ambulatory Visit: Payer: Self-pay | Admitting: Internal Medicine

## 2024-08-09 ENCOUNTER — Ambulatory Visit (INDEPENDENT_AMBULATORY_CARE_PROVIDER_SITE_OTHER)
Admission: RE | Admit: 2024-08-09 | Discharge: 2024-08-09 | Disposition: A | Discharge status: Home / Self Care | Source: Ambulatory Visit | Attending: Internal Medicine | Admitting: Internal Medicine

## 2024-08-09 DIAGNOSIS — M859 Disorder of bone density and structure, unspecified: Secondary | ICD-10-CM | POA: Diagnosis not present

## 2024-08-11 DIAGNOSIS — L84 Corns and callosities: Secondary | ICD-10-CM | POA: Diagnosis not present

## 2024-08-11 DIAGNOSIS — M79674 Pain in right toe(s): Secondary | ICD-10-CM | POA: Diagnosis not present

## 2024-08-11 DIAGNOSIS — E1142 Type 2 diabetes mellitus with diabetic polyneuropathy: Secondary | ICD-10-CM | POA: Diagnosis not present

## 2024-08-11 DIAGNOSIS — B351 Tinea unguium: Secondary | ICD-10-CM | POA: Diagnosis not present

## 2024-08-11 DIAGNOSIS — M79675 Pain in left toe(s): Secondary | ICD-10-CM | POA: Diagnosis not present

## 2024-08-12 LAB — HM DEXA SCAN: HM Dexa Scan: -0.6

## 2024-08-12 NOTE — Progress Notes (Signed)
 Spoke with patient daughter and she states that she does agree with starting ms Labo on the fosamax  and also wanted to know should she continue the Vitamin D ?

## 2024-08-15 MED ORDER — ALENDRONATE SODIUM 70 MG PO TABS: 70.0000 mg | ORAL_TABLET | ORAL | 3 refills | Status: AC

## 2024-08-18 ENCOUNTER — Other Ambulatory Visit: Payer: Self-pay | Admitting: Internal Medicine

## 2024-08-18 DIAGNOSIS — I10 Essential (primary) hypertension: Secondary | ICD-10-CM

## 2024-08-19 NOTE — Progress Notes (Signed)
 I have called patient back and relayed message

## 2024-09-20 ENCOUNTER — Encounter: Payer: Self-pay | Admitting: Internal Medicine

## 2024-09-20 ENCOUNTER — Ambulatory Visit: Admitting: Internal Medicine

## 2024-09-20 VITALS — BP 128/70 | HR 79 | Temp 98.0°F | Ht <= 58 in | Wt 110.0 lb

## 2024-09-20 DIAGNOSIS — E1169 Type 2 diabetes mellitus with other specified complication: Secondary | ICD-10-CM

## 2024-09-20 DIAGNOSIS — Z23 Encounter for immunization: Secondary | ICD-10-CM

## 2024-09-20 DIAGNOSIS — E1122 Type 2 diabetes mellitus with diabetic chronic kidney disease: Secondary | ICD-10-CM

## 2024-09-20 DIAGNOSIS — I10 Essential (primary) hypertension: Secondary | ICD-10-CM

## 2024-09-20 DIAGNOSIS — N184 Chronic kidney disease, stage 4 (severe): Secondary | ICD-10-CM

## 2024-09-20 DIAGNOSIS — Z0000 Encounter for general adult medical examination without abnormal findings: Secondary | ICD-10-CM | POA: Diagnosis not present

## 2024-09-20 DIAGNOSIS — M810 Age-related osteoporosis without current pathological fracture: Secondary | ICD-10-CM | POA: Diagnosis not present

## 2024-09-20 DIAGNOSIS — E118 Type 2 diabetes mellitus with unspecified complications: Secondary | ICD-10-CM

## 2024-09-20 DIAGNOSIS — E785 Hyperlipidemia, unspecified: Secondary | ICD-10-CM | POA: Diagnosis not present

## 2024-09-20 LAB — CBC
HCT: 31.3 % — ABNORMAL LOW (ref 36.0–46.0)
Hemoglobin: 10 g/dL — ABNORMAL LOW (ref 12.0–15.0)
MCHC: 31.9 g/dL (ref 30.0–36.0)
MCV: 84.1 fl (ref 78.0–100.0)
Platelets: 257 K/uL (ref 150.0–400.0)
RBC: 3.72 Mil/uL — ABNORMAL LOW (ref 3.87–5.11)
RDW: 17.3 % — ABNORMAL HIGH (ref 11.5–15.5)
WBC: 5.2 K/uL (ref 4.0–10.5)

## 2024-09-20 LAB — COMPREHENSIVE METABOLIC PANEL WITH GFR
ALT: 12 U/L (ref 0–35)
AST: 21 U/L (ref 0–37)
Albumin: 4.5 g/dL (ref 3.5–5.2)
Alkaline Phosphatase: 32 U/L — ABNORMAL LOW (ref 39–117)
BUN: 25 mg/dL — ABNORMAL HIGH (ref 6–23)
CO2: 25 meq/L (ref 19–32)
Calcium: 10.1 mg/dL (ref 8.4–10.5)
Chloride: 104 meq/L (ref 96–112)
Creatinine, Ser: 1.46 mg/dL — ABNORMAL HIGH (ref 0.40–1.20)
GFR: 31.89 mL/min — ABNORMAL LOW (ref >60.00)
Glucose, Bld: 176 mg/dL — ABNORMAL HIGH (ref 70–99)
Potassium: 4.2 meq/L (ref 3.5–5.1)
Sodium: 136 meq/L (ref 135–145)
Total Bilirubin: 0.5 mg/dL (ref 0.2–1.2)
Total Protein: 7.6 g/dL (ref 6.0–8.3)

## 2024-09-20 LAB — LIPID PANEL
Cholesterol: 118 mg/dL (ref 0–200)
HDL: 49.7 mg/dL (ref >39.00)
LDL Cholesterol: 51 mg/dL (ref 0–99)
NonHDL: 68.28
Total CHOL/HDL Ratio: 2
Triglycerides: 84 mg/dL (ref 0.0–149.0)
VLDL: 16.8 mg/dL (ref 0.0–40.0)

## 2024-09-20 LAB — MICROALBUMIN / CREATININE URINE RATIO
Creatinine,U: 70.1 mg/dL
Microalb Creat Ratio: 168.6 mg/g — ABNORMAL HIGH (ref 0.0–30.0)
Microalb, Ur: 11.8 mg/dL — ABNORMAL HIGH (ref 0.0–1.9)

## 2024-09-20 LAB — HEMOGLOBIN A1C: Hgb A1c MFr Bld: 7.5 % — ABNORMAL HIGH (ref 4.6–6.5)

## 2024-09-20 NOTE — Assessment & Plan Note (Signed)
 Checking UACR, HgA1c, lipid panel, CMP and adjust regimen as needed. Is on statin.

## 2024-09-20 NOTE — Assessment & Plan Note (Signed)
 Checking lipid panel and adjust crestor as needed.

## 2024-09-20 NOTE — Assessment & Plan Note (Signed)
 Flu shot declines. Pneumonia given 20. Shingrix declines. Tetanus declines. Colonoscopy aged out. Mammogram aged out, pap smear aged out and dexa due 2027. Counseled about sun safety and mole surveillance. Counseled about the dangers of distracted driving. Given 10 year screening recommendations.

## 2024-09-20 NOTE — Assessment & Plan Note (Signed)
 New diagnosis and taking fosamax  weekly starting this year.

## 2024-09-20 NOTE — Progress Notes (Signed)
   Subjective:   Patient ID: Kelly Castillo, female    DOB: 1933-12-23, 89 y.o.   MRN: [id number]  The patient is here for physical. Pertinent topics discussed: Discussed the use of AI scribe software for clinical note transcription with the patient, who gave verbal consent to proceed.  History of Present Illness Kelly Castillo is an 89 year old female who presents with concerns about excessive daytime sleepiness and medication management. She is accompanied by her daughter.  She experiences excessive daytime sleepiness, which her daughter attributes to disrupted sleep at night. She experiences daytime sleepiness and sometimes naps during the day. No chest pain, tightness, or pressure. No breathing problems, such as shortness of breath or being easily winded. No new stomach issues, urinary problems, or major joint problems, except for some neck discomfort.  Her daughter is concerned about the correct administration of Fosamax  (alendronate ) for bone health. She has not started it yet and is seeking clarification on the timing and method of administration. She continues to take 5000 IU of vitamin D  daily.  PMH, [facility], social history reviewed and updated  Review of Systems  Constitutional: Negative.   HENT: Negative.    Eyes: Negative.   Respiratory:  Negative for cough, chest tightness and shortness of breath.   Cardiovascular:  Negative for chest pain, palpitations and leg swelling.  Gastrointestinal:  Negative for abdominal distention, abdominal pain, constipation, diarrhea, nausea and vomiting.  Musculoskeletal: Negative.   Skin: Negative.   Neurological: Negative.   Psychiatric/Behavioral: Negative.      Objective:  Physical Exam Constitutional:      Appearance: She is well-developed.  HENT:     Head: Normocephalic and atraumatic.  Cardiovascular:     Rate and Rhythm: Normal rate and regular rhythm.  Pulmonary:     Effort: Pulmonary effort is normal. No respiratory  distress.     Breath sounds: Normal breath sounds. No wheezing or rales.  Abdominal:     General: Bowel sounds are normal. There is no distension.     Palpations: Abdomen is soft.     Tenderness: There is no abdominal tenderness.  Musculoskeletal:     Cervical back: Normal range of motion.  Skin:    General: Skin is warm and dry.  Neurological:     Mental Status: She is alert and oriented to person, place, and time.     Coordination: Coordination normal.     Vitals:   09/20/24 0854  BP: 128/70  Pulse: 79  Temp: 98 F (36.7 C)  TempSrc: Oral  SpO2: 99%  Weight: 110 lb (49.9 kg)  Height: 4' 10 (1.473 m)    Assessment & Plan:  Prevnar 20 given at visit

## 2024-09-20 NOTE — Patient Instructions (Signed)
 The safe limit for tylenol  is 3000 mg daily. This is 9 pills per day of the 325 mg pills. This is 6 pills per day if you have the 500 mg pills.

## 2024-09-20 NOTE — Assessment & Plan Note (Signed)
 Checking CMP and adjust as needed. BP and DM at goal.

## 2024-09-20 NOTE — Addendum Note (Signed)
 Addended by: EZZARD EDSEL HERO on: 09/20/2024 09:39 AM   Modules accepted: Orders

## 2024-09-20 NOTE — Assessment & Plan Note (Signed)
 BP at goal checking CMP and adjust as needed.

## 2024-09-21 ENCOUNTER — Ambulatory Visit: Payer: Self-pay | Admitting: Internal Medicine

## 2024-10-08 ENCOUNTER — Other Ambulatory Visit: Payer: Self-pay | Admitting: Nurse Practitioner

## 2024-10-30 ENCOUNTER — Other Ambulatory Visit: Payer: Self-pay | Admitting: Internal Medicine

## 2024-10-30 DIAGNOSIS — E1169 Type 2 diabetes mellitus with other specified complication: Secondary | ICD-10-CM

## 2024-10-30 DIAGNOSIS — N184 Chronic kidney disease, stage 4 (severe): Secondary | ICD-10-CM

## 2024-10-30 DIAGNOSIS — D631 Anemia in chronic kidney disease: Secondary | ICD-10-CM

## 2024-10-30 DIAGNOSIS — E1165 Type 2 diabetes mellitus with hyperglycemia: Secondary | ICD-10-CM

## 2024-10-30 DIAGNOSIS — E1159 Type 2 diabetes mellitus with other circulatory complications: Secondary | ICD-10-CM

## 2024-11-11 ENCOUNTER — Other Ambulatory Visit: Payer: Self-pay | Admitting: Internal Medicine

## 2024-11-11 DIAGNOSIS — E1159 Type 2 diabetes mellitus with other circulatory complications: Secondary | ICD-10-CM

## 2024-11-16 ENCOUNTER — Other Ambulatory Visit: Payer: Self-pay | Admitting: Nurse Practitioner

## 2024-11-21 ENCOUNTER — Other Ambulatory Visit: Payer: Self-pay | Admitting: Nurse Practitioner

## 2024-11-21 ENCOUNTER — Telehealth: Payer: Self-pay | Admitting: Internal Medicine

## 2024-11-21 NOTE — Telephone Encounter (Signed)
 Copied from CRM #[id number]. Topic: Clinical - Medication Refill >> Nov 21, 2024  4:39 PM Shanda MATSU wrote: Medication: EMBECTA PEN NEEDLE NANO 2 GEN 32G X 4 MM MISC [Pharmacy Med Name: NANO 2 GEN PEN NEEDLE 32G Lantus  insulin   Has the patient contacted their pharmacy? Yes, referred to provider (Agent: If no, request that the patient contact the pharmacy for the refill. If patient does not wish to contact the pharmacy document the reason why and proceed with request.) (Agent: If yes, when and what did the pharmacy advise?)  This is the patient's preferred pharmacy:  [id number] - [place], [place] - [address] [place] [address] Phone: [phone] Fax: [phone]  Is this the correct pharmacy for this prescription? Yes If no, delete pharmacy and type the correct one.   Has the prescription been filled recently? No  Is the patient out of the medication? Yes  Has the patient been seen for an appointment in the last year OR does the patient have an upcoming appointment? Yes  Can we respond through MyChart? No  Agent: Please be advised that Rx refills may take up to 3 business days. We ask that you follow-up with your pharmacy.

## 2024-11-24 NOTE — Telephone Encounter (Signed)
 Patient daughter, Lonell, okay per DPR called in as mom having difficulty getting her Lantus  refilled.  In review  the Refills from her pharmacy were being sent to [facility], and they were denying the request. Lonell reached out to our office for clarity, thinking we were denying the request.  In review patient last saw Dr. Joeseph 09/2024 and was still taking the 6 units of Lantus , that was last filled by [facility], in April of 2025. Patient is in need of refill but medication is no longer on patient medication list, as it was removed during her 12/22/2023 visit.   Told daughter we would call her back once I have spoken with the provider for clarity.

## 2024-11-25 NOTE — Telephone Encounter (Signed)
 She is not taking insulin  and was asked a couple of times to stop this at visits here. Should not be refilled. Ok to stop.

## 2024-11-25 NOTE — Telephone Encounter (Signed)
 Spoke with daughter Lonell and per Dr. Rollene pt was d/c on insulin  and should not be refilled. Pt's daughter Lonell, verbalized understanding.

## 2024-12-02 ENCOUNTER — Other Ambulatory Visit: Payer: Self-pay | Admitting: Internal Medicine

## 2024-12-02 DIAGNOSIS — I10 Essential (primary) hypertension: Secondary | ICD-10-CM

## 2025-03-20 ENCOUNTER — Ambulatory Visit: Admitting: Internal Medicine

## 2025-03-21 ENCOUNTER — Ambulatory Visit: Admitting: Internal Medicine

## 2025-05-26 ENCOUNTER — Ambulatory Visit
